# Patient Record
Sex: Female | Born: 1935 | Race: White | Hispanic: No | Smoking: Never smoker
Health system: Southern US, Community
[De-identification: ages and names within clinical notes are randomized; demographics above are authoritative.]

## PROBLEM LIST (undated history)

## (undated) DIAGNOSIS — I10 Essential (primary) hypertension: Secondary | ICD-10-CM

## (undated) DIAGNOSIS — E559 Vitamin D deficiency, unspecified: Secondary | ICD-10-CM

## (undated) DIAGNOSIS — E785 Hyperlipidemia, unspecified: Secondary | ICD-10-CM

## (undated) DIAGNOSIS — K589 Irritable bowel syndrome without diarrhea: Secondary | ICD-10-CM

## (undated) DIAGNOSIS — K5790 Diverticulosis of intestine, part unspecified, without perforation or abscess without bleeding: Secondary | ICD-10-CM

## (undated) DIAGNOSIS — J019 Acute sinusitis, unspecified: Secondary | ICD-10-CM

## (undated) DIAGNOSIS — K219 Gastro-esophageal reflux disease without esophagitis: Secondary | ICD-10-CM

## (undated) DIAGNOSIS — C541 Malignant neoplasm of endometrium: Secondary | ICD-10-CM

## (undated) DIAGNOSIS — M8580 Other specified disorders of bone density and structure, unspecified site: Secondary | ICD-10-CM

## (undated) HISTORY — DX: Acute sinusitis, unspecified: J01.90

## (undated) HISTORY — DX: Irritable bowel syndrome, unspecified: K58.9

## (undated) HISTORY — DX: Essential (primary) hypertension: I10

## (undated) HISTORY — DX: Malignant neoplasm of endometrium: C54.1

## (undated) HISTORY — DX: Diverticulosis of intestine, part unspecified, without perforation or abscess without bleeding: K57.90

## (undated) HISTORY — DX: Hyperlipidemia, unspecified: E78.5

## (undated) HISTORY — DX: Vitamin D deficiency, unspecified: E55.9

## (undated) HISTORY — DX: Gilbert syndrome: E80.4

## (undated) HISTORY — DX: Other specified disorders of bone density and structure, unspecified site: M85.80

## (undated) HISTORY — DX: Gastro-esophageal reflux disease without esophagitis: K21.9

---

## 1997-10-25 ENCOUNTER — Inpatient Hospital Stay (HOSPITAL_COMMUNITY): Admission: RE | Admit: 1997-10-25 | Discharge: 1997-10-30 | Payer: Self-pay | Admitting: Orthopedic Surgery

## 1999-02-20 ENCOUNTER — Encounter: Payer: Self-pay | Admitting: Orthopedic Surgery

## 1999-02-24 ENCOUNTER — Inpatient Hospital Stay (HOSPITAL_COMMUNITY): Admission: RE | Admit: 1999-02-24 | Discharge: 1999-02-28 | Payer: Self-pay | Admitting: Orthopedic Surgery

## 2002-10-25 ENCOUNTER — Encounter: Payer: Self-pay | Admitting: Internal Medicine

## 2002-10-25 ENCOUNTER — Encounter: Admission: RE | Admit: 2002-10-25 | Discharge: 2002-10-25 | Payer: Self-pay | Admitting: Internal Medicine

## 2004-04-01 ENCOUNTER — Encounter: Admission: RE | Admit: 2004-04-01 | Discharge: 2004-04-01 | Payer: Self-pay | Admitting: Internal Medicine

## 2004-04-15 ENCOUNTER — Ambulatory Visit (HOSPITAL_COMMUNITY): Admission: RE | Admit: 2004-04-15 | Discharge: 2004-04-15 | Payer: Self-pay | Admitting: Gastroenterology

## 2004-04-25 ENCOUNTER — Ambulatory Visit (HOSPITAL_COMMUNITY): Admission: RE | Admit: 2004-04-25 | Discharge: 2004-04-25 | Payer: Self-pay | Admitting: Internal Medicine

## 2004-09-03 ENCOUNTER — Ambulatory Visit (HOSPITAL_COMMUNITY): Admission: RE | Admit: 2004-09-03 | Discharge: 2004-09-03 | Payer: Self-pay | Admitting: Gastroenterology

## 2004-10-02 ENCOUNTER — Ambulatory Visit (HOSPITAL_COMMUNITY): Admission: RE | Admit: 2004-10-02 | Discharge: 2004-10-02 | Payer: Self-pay | Admitting: Gastroenterology

## 2005-10-01 ENCOUNTER — Ambulatory Visit (HOSPITAL_COMMUNITY): Admission: RE | Admit: 2005-10-01 | Discharge: 2005-10-01 | Payer: Self-pay | Admitting: Obstetrics and Gynecology

## 2006-10-05 ENCOUNTER — Inpatient Hospital Stay (HOSPITAL_COMMUNITY): Admission: RE | Admit: 2006-10-05 | Discharge: 2006-10-06 | Payer: Self-pay | Admitting: Neurosurgery

## 2007-12-29 ENCOUNTER — Ambulatory Visit (HOSPITAL_COMMUNITY): Admission: RE | Admit: 2007-12-29 | Discharge: 2007-12-29 | Payer: Self-pay | Admitting: Internal Medicine

## 2009-12-30 ENCOUNTER — Ambulatory Visit (HOSPITAL_COMMUNITY): Admission: RE | Admit: 2009-12-30 | Discharge: 2009-12-30 | Payer: Self-pay | Admitting: Family Medicine

## 2010-09-16 NOTE — Op Note (Signed)
Brianna Bush            ACCOUNT NO.:  [id number]   MEDICAL RECORD NO.:  [id number]          PATIENT TYPE:  INP   LOCATION:  2899                         FACILITY:  [facility]   PHYSICIAN:  Danae Orleans. Venetia Maxon, M.D.  DATE OF BIRTH:  April 14, 1924   DATE OF PROCEDURE:  10/05/2006  DATE OF DISCHARGE:                               OPERATIVE REPORT   PREOPERATIVE DIAGNOSIS:  Herniated lumbar disk L2-3 left with  retrolisthesis L2 on L3 along with spondylosis, degenerative disease and  radiculopathy.   FINAL DIAGNOSIS:  Herniated lumbar disk L2-3 left with retrolisthesis L2  on L3 along with spondylosis, degenerative disease and radiculopathy.   PROCEDURES:  Left L2-3 microdiskectomy with microdissection.   SURGEON:  Danae Orleans. Venetia Maxon, M.D.   ASSISTANT:  Clydene Fake, M.D.   ANESTHESIA:  General endotracheal anesthesia.   BLOOD LOSS:  50 mL.   COMPLICATIONS:  None.   DISPOSITION:  Recovery.   INDICATIONS:  Brianna Bush is an 77 year old woman with severe  intractable left leg pain.  She had an MRI of her lumbar spine which was  interpreted as demonstrating epidural hematoma on the left at L2-3 but  the patient failed to improve and came for neurosurgical consultation.  I felt that this was consistent with a disk herniation and free fragment  herniated disk material rather than epidural hematoma.  She has  retrolisthesis of L2 on L3 but does not complain of significant back  pain and has mostly left leg pain.  It was elected to take her to  surgery for microdiskectomy at this affected level.   PROCEDURE:  Ms. Minetti was brought to the operating room.  Following  satisfactory and uncomplicated induction of general endotracheal  anesthesia and placement intravenous lines, the patient was placed in  the prone position on the Wilson frame.  Her low back was then prepped  and draped in the usual sterile fashion.  A spinal needle was placed and  the preop localizing x-ray was  obtained but was not possible to  visualize the spinal needle because of the patient's large body habitus.  Subsequently additional x-ray was obtained which demonstrated the needle  at overlying L2 and the area of planned incision was then infiltrated  with quarter percent Marcaine and half percent lidocaine with 1:200,000  epinephrine.  Incision was made in midline, carried to the lumbodorsal  fascia which was incised to the left side of midline.  Subperiosteal  dissection was performed exposing what was felt to be the L2-3 level.  Her intraoperative x-ray demonstrated marker probe at L1-2.  The  exposure was carried caudad one level and final x-ray demonstrated  marker probe at L2-3.  Hemisemilaminectomy of L2 was then performed with  high-speed drill and completed with Kerrison rongeur.  An effort was  made not to encroach upon the facette joint because of significant  instability at this level.  The cephalad aspect of the L3 lamina was  identified and using Kerrison rongeur and laminectomy at this level  overlying the superior aspect of L3 on the left was then performed  exposing the thecal sac.  Lateral recess was also decompressed and the  ligamentum flavum was detached and removed in piecemeal fashion.  Microscope was brought into the field and under microscopic  visualization, the lateral aspect of the thecal sac was identified and  retracted medially with a Penfield four dissector the L3 nerve root was  identified and within the axilla of the nerve root large amount of  herniated disk material was removed with micro pituitary which was  causing significant compression of the L3 nerve root along with the  lateral aspect of the canal.  A ball tip probe was then inserted in the  spinal canal and additional fragments of disk material were removed with  resultant significant decompression of thecal sac and the L3 nerve root.  The disk space itself was not entered.  There did not appear  to be  significant residual herniated disk material.  Hemostasis was assured  with Gelfoam soaked in thrombin and subsequently the operative bed was  bathed in 2 mL of fentanyl and Depo-Medrol.  The self-retaining tractor  was removed.  Microscope was taken out of the field.  Te lumbodorsal  fascia was closed with 0-0 Vicryl sutures, subcutaneous tissues were  approximated 2-0 Vicryl interrupted, inverted sutures and skin edges  were approximated interrupted 3-0 Vicryl, subcu stitch.  The wound was  dressed with Dermabond.  The patient extubated in the operating room  taken to recovery in stable satisfactory condition having her operation  well.  Counts correct at end the case.      Danae Orleans. Venetia Maxon, M.D.  Electronically Signed     JDS/MEDQ  D:  10/05/2006  T:  10/05/2006  Job:  [id number]

## 2010-09-19 NOTE — Op Note (Signed)
NAMEIRVING, LUBBERS            ACCOUNT NO.:  [id number]   MEDICAL RECORD NO.:  [id number]          PATIENT TYPE:  AMB   LOCATION:  ENDO                         FACILITY:  [facility]   PHYSICIAN:  Danise Edge, M.D.   DATE OF BIRTH:  [birth year]   DATE OF PROCEDURE:  09/03/2004  DATE OF DISCHARGE:                                 OPERATIVE REPORT   PROCEDURE INDICATIONS:  Ms. Jehan Ranganathan is a 80-hour-old female born  1923-10-15. Ms. Mccullars has laryngitis suspected to be due to  uncontrolled gastroesophageal reflux. Her symptoms developed on a proton  pump inhibitor and did not improve with double dose proton pump inhibitor  therapy. She reports no heartburn, dysphagia or dilatation.   ENDOSCOPIST:  Danise Edge, M.D.   PREMEDICATION:  Versed 5 mg, Demerol 50 mg.   PROCEDURE:  After obtaining informed consent, Ms. Martha was placed in the  left lateral decubitus position. I administered intravenous Demerol and  intravenous Versed to achieve conscious sedation for the procedure. The  patient's blood pressure, oxygen saturation and cardiac rhythm were  monitored throughout the procedure and documented in the medical record.   The Olympus gastroscope was passed through the posterior hypopharynx into  the proximal esophagus without difficulty. The hypopharynx, larynx and vocal  cords appeared normal by my exam. She underwent an ear, nose and throat exam  in the region recent past which suggested laryngitis due to gastroesophageal  reflux.   Esophagoscopy:  The proximal mid and lower segments of the esophageal mucosa  appear completely normal. The squamocolumnar junction is noted at 35 cm from  the incisor teeth. There is no endoscopic evidence for the presence of  erosive esophagitis, Barrett's esophagus or esophageal mucosal scarring.   Gastroscopy:  Ms. Deloria has a moderate-sized hiatal hernia. Retroflexed  view of the gastric cardia and fundus was normal. The  diaphragmatic hiatus  is quite patulous. The gastric body, antrum and pylorus appear normal.   Duodenoscopy:  The duodenal bulb, mid duodenum and distal duodenum appear  normal.   ASSESSMENT:  Gastroesophageal reflux associated with a hiatal hernia but  otherwise normal esophagogastroduodenoscopy.   RECOMMENDATIONS:  I will schedule Ms. Gearhart for an esophageal manometry  with 24-hour ambulatory esophageal pH study on Axid each morning to  determine if she has uncontrolled acid reflux on proton pump inhibitor  therapy and should consider antireflux surgery.      MJ/MEDQ  D:  09/03/2004  T:  09/03/2004  Job:  [id number]   cc:   Darius Bump, M.D.  [place]  [place] [address]  Fax: [phone]

## 2010-09-19 NOTE — Op Note (Signed)
[name], [name]            ACCOUNT NO.:  [id number]   MEDICAL RECORD NO.:  [id number]          PATIENT TYPE:  AMB   LOCATION:  ENDO                         FACILITY:  [facility]   PHYSICIAN:  Danise Edge, M.D.   DATE OF BIRTH:  October 24, 1923   DATE OF PROCEDURE:  10/02/2004  DATE OF DISCHARGE:  10/02/2004                                 OPERATIVE REPORT   PROCEDURE:  Esophageal manometry with ambulatory esophageal pH study.   INDICATIONS:  Ms. Brianna Bush is a 77 year old female born February 03, 1924.  [name] takes Aciphex to control her chronic gastroesophageal  reflux.  She is undergoing ambulatory esophageal pH monitoring while on  Aciphex to determine if she is having uncontrolled acid reflux causing  hoarseness.   PROCEDURE:  Esophageal manometry report.   Upper esophageal sphincter data:  Ms. Brianna Bush upper esophageal sphincter  function is coordinated and normal.   Esophageal body data:  Ms. Brianna Bush esophageal body function is based on 10  wet swallows.  Ninety percent of wet swallows resulted in peristaltic contractions with a  wave amplitude 167 mmHg.  One of 10 wet swallows resulted in a simultaneous  contraction.  Esophageal body function is normal.   Lower esophageal sphincter data:  Ms. Brianna Bush resting lower esophageal  sphincter pressure is 45 mmHg.  The lower esophageal sphincter relaxes 85%  with swallows and remains relaxed for 14 seconds.  Lower esophageal  sphincter function is normal with a resting pressure in the upper limits of  normal.   ASSESSMENT:  Normal esophageal manometry.   AMBULATORY ESOPHAGEAL PH DATA:  The composite score analysis based on the  distal channel recording and proximal channel recording was within normal  limits, indicating no excessive esophageal reflux on Aciphex.  Ms. Brianna Bush  upright time in reflux was 0.2%; recumbent time in reflux 0.1%; total time  in reflux 0.1%. Ms. Brianna Bush had no reflux episodes  lasting over five  minutes. During her 24-hour monitoring, she had five episodes of reflux with  the longest episode lasting one minute.  Her composite score analysis:  1.1  (normal less than 22).   ASSESSMENT:  Normal ambulatory esophageal pH study on Aciphex.       MJ/MEDQ  D:  10/21/2004  T:  10/21/2004  Job:  [id number]

## 2010-09-19 NOTE — Op Note (Signed)
[name], Brianna Bush            ACCOUNT NO.:  [id number]   MEDICAL RECORD NO.:  [id number]          PATIENT TYPE:  AMB   LOCATION:  ENDO                         [facility]ACILITY:  [facility]   PHYSICIAN:  Danise Edge, M.D.   DATE OF BIRTH:  02-20-1924   DATE OF PROCEDURE:  04/15/2004  DATE OF DISCHARGE:                                 OPERATIVE REPORT   PROCEDURE:  Screening colonoscopy.   REFERRING PHYSICIAN:  Darius Bump, M.D.   PROCEDURE INDICATION:  Ms. Onetha Gaffey is a 77 year old female born  21-Mar-1924.  Ms. Constantin is scheduled to undergo her first screening  colonoscopy with polypectomy to prevent colon cancer.   ENDOSCOPIST:  Danise Edge, M.D.   PREMEDICATION:  Versed 5 mg, Demerol 60 mg.   PROCEDURE:  After obtaining informed consent, Ms. Foland was placed in the  left lateral decubitus position.  I administered intravenous Demerol and  intravenous Versed to achieve conscious sedation for the procedure.  The  patient's blood pressure, oxygen saturation, and cardiac rhythm were  monitored throughout the procedure and documented in the medical record.   Anal inspection and digital rectal exam were normal.  The Olympus adjustable  pediatric colonoscope was introduced into the rectum and advanced to the  cecum.  Colonic preparation for the exam today was excellent.   Rectum normal.   Sigmoid colon and descending colon:  Colonic diverticulosis.   Splenic flexure normal.   Transverse colon normal.   Hepatic flexure normal.   Ascending colon normal.   Cecum and ileocecal valve normal.   ASSESSMENT:  Normal screening proctocolonoscopy to the cecum.  Left colonic  diverticulosis.      MJ/MEDQ  D:  04/15/2004  T:  04/15/2004  Job:  [id number]   cc:   Darius Bump, M.D.  [place]  [place] [address]  Fax: [phone]

## 2011-02-19 LAB — HEPATIC FUNCTION PANEL
ALT: 20
Alkaline Phosphatase: 44
Indirect Bilirubin: 2 — ABNORMAL HIGH
Total Protein: 7.1

## 2011-12-07 ENCOUNTER — Other Ambulatory Visit: Payer: Self-pay | Admitting: Family Medicine

## 2011-12-07 DIAGNOSIS — I10 Essential (primary) hypertension: Secondary | ICD-10-CM

## 2011-12-07 DIAGNOSIS — R42 Dizziness and giddiness: Secondary | ICD-10-CM

## 2012-01-12 ENCOUNTER — Ambulatory Visit
Admission: RE | Admit: 2012-01-12 | Discharge: 2012-01-12 | Disposition: A | Discharge status: Home / Self Care | Payer: Medicare Other | Source: Ambulatory Visit | Attending: Family Medicine | Admitting: Family Medicine

## 2012-01-12 DIAGNOSIS — I10 Essential (primary) hypertension: Secondary | ICD-10-CM

## 2012-01-12 DIAGNOSIS — R42 Dizziness and giddiness: Secondary | ICD-10-CM

## 2012-12-30 ENCOUNTER — Other Ambulatory Visit: Payer: Self-pay | Admitting: Family Medicine

## 2012-12-30 DIAGNOSIS — I6529 Occlusion and stenosis of unspecified carotid artery: Secondary | ICD-10-CM

## 2013-01-04 ENCOUNTER — Ambulatory Visit
Admission: RE | Admit: 2013-01-04 | Discharge: 2013-01-04 | Disposition: A | Discharge status: Home / Self Care | Payer: Medicare Other | Source: Ambulatory Visit | Attending: Family Medicine | Admitting: Family Medicine

## 2013-01-04 DIAGNOSIS — I6529 Occlusion and stenosis of unspecified carotid artery: Secondary | ICD-10-CM

## 2013-01-09 ENCOUNTER — Other Ambulatory Visit: Payer: Self-pay | Admitting: Family Medicine

## 2013-01-09 DIAGNOSIS — E041 Nontoxic single thyroid nodule: Secondary | ICD-10-CM

## 2013-01-11 ENCOUNTER — Ambulatory Visit
Admission: RE | Admit: 2013-01-11 | Discharge: 2013-01-11 | Disposition: A | Discharge status: Home / Self Care | Payer: Medicare Other | Source: Ambulatory Visit | Attending: Family Medicine | Admitting: Family Medicine

## 2013-01-11 DIAGNOSIS — E041 Nontoxic single thyroid nodule: Secondary | ICD-10-CM

## 2013-01-17 ENCOUNTER — Other Ambulatory Visit: Payer: Self-pay | Admitting: Family Medicine

## 2013-01-17 DIAGNOSIS — E041 Nontoxic single thyroid nodule: Secondary | ICD-10-CM

## 2013-01-18 ENCOUNTER — Other Ambulatory Visit: Payer: Self-pay | Admitting: Family Medicine

## 2013-01-18 DIAGNOSIS — E041 Nontoxic single thyroid nodule: Secondary | ICD-10-CM

## 2013-01-19 ENCOUNTER — Other Ambulatory Visit (HOSPITAL_COMMUNITY)
Admission: RE | Admit: 2013-01-19 | Discharge: 2013-01-19 | Disposition: A | Discharge status: Home / Self Care | Payer: Medicare Other | Source: Ambulatory Visit | Attending: Interventional Radiology | Admitting: Interventional Radiology

## 2013-01-19 ENCOUNTER — Ambulatory Visit
Admission: RE | Admit: 2013-01-19 | Discharge: 2013-01-19 | Disposition: A | Discharge status: Home / Self Care | Payer: Medicare Other | Source: Ambulatory Visit | Attending: Family Medicine | Admitting: Family Medicine

## 2013-01-19 DIAGNOSIS — E041 Nontoxic single thyroid nodule: Secondary | ICD-10-CM

## 2014-03-09 ENCOUNTER — Other Ambulatory Visit: Payer: Self-pay | Admitting: Family Medicine

## 2014-03-09 DIAGNOSIS — I6521 Occlusion and stenosis of right carotid artery: Secondary | ICD-10-CM

## 2014-03-13 ENCOUNTER — Ambulatory Visit
Admission: RE | Admit: 2014-03-13 | Discharge: 2014-03-13 | Disposition: A | Discharge status: Home / Self Care | Payer: Medicare HMO | Source: Ambulatory Visit | Attending: Family Medicine | Admitting: Family Medicine

## 2014-03-13 DIAGNOSIS — I6521 Occlusion and stenosis of right carotid artery: Secondary | ICD-10-CM

## 2015-02-12 DIAGNOSIS — J385 Laryngeal spasm: Secondary | ICD-10-CM | POA: Diagnosis not present

## 2015-02-20 DIAGNOSIS — H353113 Nonexudative age-related macular degeneration, right eye, advanced atrophic without subfoveal involvement: Secondary | ICD-10-CM | POA: Diagnosis not present

## 2015-02-20 DIAGNOSIS — H353221 Exudative age-related macular degeneration, left eye, with active choroidal neovascularization: Secondary | ICD-10-CM | POA: Diagnosis not present

## 2015-03-05 DIAGNOSIS — M7051 Other bursitis of knee, right knee: Secondary | ICD-10-CM | POA: Diagnosis not present

## 2015-03-08 DIAGNOSIS — J385 Laryngeal spasm: Secondary | ICD-10-CM | POA: Diagnosis not present

## 2015-03-25 DIAGNOSIS — H353221 Exudative age-related macular degeneration, left eye, with active choroidal neovascularization: Secondary | ICD-10-CM | POA: Diagnosis not present

## 2015-03-25 DIAGNOSIS — H43813 Vitreous degeneration, bilateral: Secondary | ICD-10-CM | POA: Diagnosis not present

## 2015-03-25 DIAGNOSIS — H353113 Nonexudative age-related macular degeneration, right eye, advanced atrophic without subfoveal involvement: Secondary | ICD-10-CM | POA: Diagnosis not present

## 2015-03-27 ENCOUNTER — Other Ambulatory Visit: Payer: Self-pay | Admitting: Family Medicine

## 2015-03-27 DIAGNOSIS — I6529 Occlusion and stenosis of unspecified carotid artery: Secondary | ICD-10-CM

## 2015-03-27 DIAGNOSIS — I6523 Occlusion and stenosis of bilateral carotid arteries: Secondary | ICD-10-CM | POA: Diagnosis not present

## 2015-04-03 ENCOUNTER — Ambulatory Visit
Admission: RE | Admit: 2015-04-03 | Discharge: 2015-04-03 | Disposition: A | Discharge status: Home / Self Care | Payer: Medicare HMO | Source: Ambulatory Visit | Attending: Family Medicine | Admitting: Family Medicine

## 2015-04-03 DIAGNOSIS — I6529 Occlusion and stenosis of unspecified carotid artery: Secondary | ICD-10-CM

## 2015-04-03 DIAGNOSIS — I6523 Occlusion and stenosis of bilateral carotid arteries: Secondary | ICD-10-CM | POA: Diagnosis not present

## 2015-05-01 DIAGNOSIS — H43813 Vitreous degeneration, bilateral: Secondary | ICD-10-CM | POA: Diagnosis not present

## 2015-05-01 DIAGNOSIS — H34212 Partial retinal artery occlusion, left eye: Secondary | ICD-10-CM | POA: Diagnosis not present

## 2015-05-01 DIAGNOSIS — H353113 Nonexudative age-related macular degeneration, right eye, advanced atrophic without subfoveal involvement: Secondary | ICD-10-CM | POA: Diagnosis not present

## 2015-05-01 DIAGNOSIS — H353221 Exudative age-related macular degeneration, left eye, with active choroidal neovascularization: Secondary | ICD-10-CM | POA: Diagnosis not present

## 2015-06-05 DIAGNOSIS — H35423 Microcystoid degeneration of retina, bilateral: Secondary | ICD-10-CM | POA: Diagnosis not present

## 2015-06-05 DIAGNOSIS — H353221 Exudative age-related macular degeneration, left eye, with active choroidal neovascularization: Secondary | ICD-10-CM | POA: Diagnosis not present

## 2015-06-05 DIAGNOSIS — H353113 Nonexudative age-related macular degeneration, right eye, advanced atrophic without subfoveal involvement: Secondary | ICD-10-CM | POA: Diagnosis not present

## 2015-06-05 DIAGNOSIS — H43813 Vitreous degeneration, bilateral: Secondary | ICD-10-CM | POA: Diagnosis not present

## 2015-06-18 DIAGNOSIS — M109 Gout, unspecified: Secondary | ICD-10-CM | POA: Diagnosis not present

## 2015-06-18 DIAGNOSIS — Z791 Long term (current) use of non-steroidal anti-inflammatories (NSAID): Secondary | ICD-10-CM | POA: Diagnosis not present

## 2015-06-18 DIAGNOSIS — I6529 Occlusion and stenosis of unspecified carotid artery: Secondary | ICD-10-CM | POA: Diagnosis not present

## 2015-06-18 DIAGNOSIS — E785 Hyperlipidemia, unspecified: Secondary | ICD-10-CM | POA: Diagnosis not present

## 2015-06-18 DIAGNOSIS — Z79899 Other long term (current) drug therapy: Secondary | ICD-10-CM | POA: Diagnosis not present

## 2015-06-18 DIAGNOSIS — M159 Polyosteoarthritis, unspecified: Secondary | ICD-10-CM | POA: Diagnosis not present

## 2015-06-18 DIAGNOSIS — E559 Vitamin D deficiency, unspecified: Secondary | ICD-10-CM | POA: Diagnosis not present

## 2015-06-27 DIAGNOSIS — M109 Gout, unspecified: Secondary | ICD-10-CM | POA: Diagnosis not present

## 2015-06-27 DIAGNOSIS — E559 Vitamin D deficiency, unspecified: Secondary | ICD-10-CM | POA: Diagnosis not present

## 2015-06-27 DIAGNOSIS — I6523 Occlusion and stenosis of bilateral carotid arteries: Secondary | ICD-10-CM | POA: Diagnosis not present

## 2015-06-27 DIAGNOSIS — R350 Frequency of micturition: Secondary | ICD-10-CM | POA: Diagnosis not present

## 2015-06-27 DIAGNOSIS — I10 Essential (primary) hypertension: Secondary | ICD-10-CM | POA: Diagnosis not present

## 2015-06-27 DIAGNOSIS — M810 Age-related osteoporosis without current pathological fracture: Secondary | ICD-10-CM | POA: Diagnosis not present

## 2015-06-27 DIAGNOSIS — L989 Disorder of the skin and subcutaneous tissue, unspecified: Secondary | ICD-10-CM | POA: Diagnosis not present

## 2015-06-27 DIAGNOSIS — E785 Hyperlipidemia, unspecified: Secondary | ICD-10-CM | POA: Diagnosis not present

## 2015-06-27 DIAGNOSIS — K219 Gastro-esophageal reflux disease without esophagitis: Secondary | ICD-10-CM | POA: Diagnosis not present

## 2015-06-27 DIAGNOSIS — R809 Proteinuria, unspecified: Secondary | ICD-10-CM | POA: Diagnosis not present

## 2015-07-10 DIAGNOSIS — H43813 Vitreous degeneration, bilateral: Secondary | ICD-10-CM | POA: Diagnosis not present

## 2015-07-10 DIAGNOSIS — H353221 Exudative age-related macular degeneration, left eye, with active choroidal neovascularization: Secondary | ICD-10-CM | POA: Diagnosis not present

## 2015-07-10 DIAGNOSIS — H353113 Nonexudative age-related macular degeneration, right eye, advanced atrophic without subfoveal involvement: Secondary | ICD-10-CM | POA: Diagnosis not present

## 2015-07-15 DIAGNOSIS — L821 Other seborrheic keratosis: Secondary | ICD-10-CM | POA: Diagnosis not present

## 2015-07-15 DIAGNOSIS — Z23 Encounter for immunization: Secondary | ICD-10-CM | POA: Diagnosis not present

## 2015-07-15 DIAGNOSIS — L570 Actinic keratosis: Secondary | ICD-10-CM | POA: Diagnosis not present

## 2015-07-15 DIAGNOSIS — C44321 Squamous cell carcinoma of skin of nose: Secondary | ICD-10-CM | POA: Diagnosis not present

## 2015-07-15 DIAGNOSIS — D485 Neoplasm of uncertain behavior of skin: Secondary | ICD-10-CM | POA: Diagnosis not present

## 2015-07-24 DIAGNOSIS — H52203 Unspecified astigmatism, bilateral: Secondary | ICD-10-CM | POA: Diagnosis not present

## 2015-07-24 DIAGNOSIS — H524 Presbyopia: Secondary | ICD-10-CM | POA: Diagnosis not present

## 2015-07-24 DIAGNOSIS — H5203 Hypermetropia, bilateral: Secondary | ICD-10-CM | POA: Diagnosis not present

## 2015-08-13 DIAGNOSIS — H43813 Vitreous degeneration, bilateral: Secondary | ICD-10-CM | POA: Diagnosis not present

## 2015-08-13 DIAGNOSIS — H353113 Nonexudative age-related macular degeneration, right eye, advanced atrophic without subfoveal involvement: Secondary | ICD-10-CM | POA: Diagnosis not present

## 2015-08-13 DIAGNOSIS — H353221 Exudative age-related macular degeneration, left eye, with active choroidal neovascularization: Secondary | ICD-10-CM | POA: Diagnosis not present

## 2015-09-11 DIAGNOSIS — H43813 Vitreous degeneration, bilateral: Secondary | ICD-10-CM | POA: Diagnosis not present

## 2015-09-11 DIAGNOSIS — H35423 Microcystoid degeneration of retina, bilateral: Secondary | ICD-10-CM | POA: Diagnosis not present

## 2015-09-11 DIAGNOSIS — H353113 Nonexudative age-related macular degeneration, right eye, advanced atrophic without subfoveal involvement: Secondary | ICD-10-CM | POA: Diagnosis not present

## 2015-09-11 DIAGNOSIS — H353221 Exudative age-related macular degeneration, left eye, with active choroidal neovascularization: Secondary | ICD-10-CM | POA: Diagnosis not present

## 2015-09-14 DIAGNOSIS — L03116 Cellulitis of left lower limb: Secondary | ICD-10-CM | POA: Diagnosis not present

## 2015-09-14 DIAGNOSIS — S81802A Unspecified open wound, left lower leg, initial encounter: Secondary | ICD-10-CM | POA: Diagnosis not present

## 2015-09-19 DIAGNOSIS — H5032 Intermittent alternating esotropia: Secondary | ICD-10-CM | POA: Diagnosis not present

## 2015-09-20 DIAGNOSIS — J385 Laryngeal spasm: Secondary | ICD-10-CM | POA: Diagnosis not present

## 2015-09-23 DIAGNOSIS — S81802D Unspecified open wound, left lower leg, subsequent encounter: Secondary | ICD-10-CM | POA: Diagnosis not present

## 2015-09-23 DIAGNOSIS — L97929 Non-pressure chronic ulcer of unspecified part of left lower leg with unspecified severity: Secondary | ICD-10-CM | POA: Diagnosis not present

## 2015-09-23 DIAGNOSIS — L03116 Cellulitis of left lower limb: Secondary | ICD-10-CM | POA: Diagnosis not present

## 2015-09-25 DIAGNOSIS — T798XXA Other early complications of trauma, initial encounter: Secondary | ICD-10-CM | POA: Diagnosis not present

## 2015-09-25 DIAGNOSIS — S81802A Unspecified open wound, left lower leg, initial encounter: Secondary | ICD-10-CM | POA: Diagnosis not present

## 2015-10-01 DIAGNOSIS — L989 Disorder of the skin and subcutaneous tissue, unspecified: Secondary | ICD-10-CM | POA: Diagnosis not present

## 2015-10-01 DIAGNOSIS — T798XXA Other early complications of trauma, initial encounter: Secondary | ICD-10-CM | POA: Diagnosis not present

## 2015-10-01 DIAGNOSIS — T148 Other injury of unspecified body region: Secondary | ICD-10-CM | POA: Diagnosis not present

## 2015-10-03 DIAGNOSIS — M810 Age-related osteoporosis without current pathological fracture: Secondary | ICD-10-CM | POA: Diagnosis not present

## 2015-10-03 DIAGNOSIS — E782 Mixed hyperlipidemia: Secondary | ICD-10-CM | POA: Diagnosis not present

## 2015-10-03 DIAGNOSIS — Z0000 Encounter for general adult medical examination without abnormal findings: Secondary | ICD-10-CM | POA: Diagnosis not present

## 2015-10-03 DIAGNOSIS — I10 Essential (primary) hypertension: Secondary | ICD-10-CM | POA: Diagnosis not present

## 2015-10-04 DIAGNOSIS — L989 Disorder of the skin and subcutaneous tissue, unspecified: Secondary | ICD-10-CM | POA: Diagnosis not present

## 2015-10-10 ENCOUNTER — Encounter (HOSPITAL_COMMUNITY): Payer: Self-pay | Admitting: Emergency Medicine

## 2015-10-10 ENCOUNTER — Inpatient Hospital Stay (HOSPITAL_COMMUNITY)
Admission: EM | Admit: 2015-10-10 | Discharge: 2015-10-12 | DRG: 392 | Disposition: A | Discharge status: Home / Self Care | Payer: Medicare HMO | Attending: Internal Medicine | Admitting: Internal Medicine

## 2015-10-10 ENCOUNTER — Other Ambulatory Visit: Payer: Self-pay | Admitting: Family

## 2015-10-10 ENCOUNTER — Emergency Department (HOSPITAL_COMMUNITY): Payer: Medicare HMO

## 2015-10-10 DIAGNOSIS — K5732 Diverticulitis of large intestine without perforation or abscess without bleeding: Secondary | ICD-10-CM

## 2015-10-10 DIAGNOSIS — A09 Infectious gastroenteritis and colitis, unspecified: Secondary | ICD-10-CM | POA: Diagnosis not present

## 2015-10-10 DIAGNOSIS — Z8542 Personal history of malignant neoplasm of other parts of uterus: Secondary | ICD-10-CM

## 2015-10-10 DIAGNOSIS — E559 Vitamin D deficiency, unspecified: Secondary | ICD-10-CM | POA: Diagnosis present

## 2015-10-10 DIAGNOSIS — K529 Noninfective gastroenteritis and colitis, unspecified: Secondary | ICD-10-CM | POA: Diagnosis not present

## 2015-10-10 DIAGNOSIS — C541 Malignant neoplasm of endometrium: Secondary | ICD-10-CM | POA: Diagnosis present

## 2015-10-10 DIAGNOSIS — Z7952 Long term (current) use of systemic steroids: Secondary | ICD-10-CM

## 2015-10-10 DIAGNOSIS — R1032 Left lower quadrant pain: Secondary | ICD-10-CM

## 2015-10-10 DIAGNOSIS — Z79899 Other long term (current) drug therapy: Secondary | ICD-10-CM

## 2015-10-10 DIAGNOSIS — N179 Acute kidney failure, unspecified: Secondary | ICD-10-CM | POA: Diagnosis present

## 2015-10-10 DIAGNOSIS — L97829 Non-pressure chronic ulcer of other part of left lower leg with unspecified severity: Secondary | ICD-10-CM | POA: Diagnosis present

## 2015-10-10 DIAGNOSIS — E876 Hypokalemia: Secondary | ICD-10-CM | POA: Diagnosis present

## 2015-10-10 DIAGNOSIS — N289 Disorder of kidney and ureter, unspecified: Secondary | ICD-10-CM

## 2015-10-10 DIAGNOSIS — R197 Diarrhea, unspecified: Secondary | ICD-10-CM | POA: Diagnosis not present

## 2015-10-10 DIAGNOSIS — R8290 Unspecified abnormal findings in urine: Secondary | ICD-10-CM | POA: Diagnosis not present

## 2015-10-10 DIAGNOSIS — K589 Irritable bowel syndrome without diarrhea: Secondary | ICD-10-CM | POA: Diagnosis present

## 2015-10-10 DIAGNOSIS — M8580 Other specified disorders of bone density and structure, unspecified site: Secondary | ICD-10-CM | POA: Diagnosis present

## 2015-10-10 DIAGNOSIS — S81802A Unspecified open wound, left lower leg, initial encounter: Secondary | ICD-10-CM | POA: Diagnosis present

## 2015-10-10 DIAGNOSIS — I499 Cardiac arrhythmia, unspecified: Secondary | ICD-10-CM | POA: Diagnosis not present

## 2015-10-10 DIAGNOSIS — Z888 Allergy status to other drugs, medicaments and biological substances status: Secondary | ICD-10-CM

## 2015-10-10 DIAGNOSIS — E785 Hyperlipidemia, unspecified: Secondary | ICD-10-CM | POA: Diagnosis present

## 2015-10-10 DIAGNOSIS — I10 Essential (primary) hypertension: Secondary | ICD-10-CM | POA: Diagnosis not present

## 2015-10-10 DIAGNOSIS — K219 Gastro-esophageal reflux disease without esophagitis: Secondary | ICD-10-CM | POA: Diagnosis present

## 2015-10-10 DIAGNOSIS — R7989 Other specified abnormal findings of blood chemistry: Secondary | ICD-10-CM | POA: Diagnosis present

## 2015-10-10 DIAGNOSIS — N1831 Chronic kidney disease, stage 3a: Secondary | ICD-10-CM

## 2015-10-10 LAB — URINALYSIS, ROUTINE W REFLEX MICROSCOPIC
Bilirubin Urine: NEGATIVE
Glucose, UA: NEGATIVE mg/dL
Hgb urine dipstick: NEGATIVE
Ketones, ur: NEGATIVE mg/dL
NITRITE: NEGATIVE
PH: 5 (ref 5.0–8.0)
Protein, ur: 100 mg/dL — AB
SPECIFIC GRAVITY, URINE: 1.024 (ref 1.005–1.030)

## 2015-10-10 LAB — CBC WITH DIFFERENTIAL/PLATELET
Basophils Absolute: 0 10*3/uL (ref 0.0–0.1)
Basophils Relative: 0 %
EOS PCT: 0 %
Eosinophils Absolute: 0 10*3/uL (ref 0.0–0.7)
HCT: 39.8 % (ref 36.0–46.0)
HEMOGLOBIN: 13.9 g/dL (ref 12.0–15.0)
LYMPHS PCT: 11 %
Lymphs Abs: 2.2 10*3/uL (ref 0.7–4.0)
MCH: 30.6 pg (ref 26.0–34.0)
MCHC: 34.9 g/dL (ref 30.0–36.0)
MCV: 87.7 fL (ref 78.0–100.0)
MONOS PCT: 8 %
Monocytes Absolute: 1.6 10*3/uL — ABNORMAL HIGH (ref 0.1–1.0)
NEUTROS PCT: 81 %
Neutro Abs: 16.5 10*3/uL — ABNORMAL HIGH (ref 1.7–7.7)
PLATELETS: ADEQUATE 10*3/uL (ref 150–400)
RBC: 4.54 MIL/uL (ref 3.87–5.11)
RDW: 15 % (ref 11.5–15.5)
SMEAR REVIEW: ADEQUATE
WBC: 20.3 10*3/uL — AB (ref 4.0–10.5)

## 2015-10-10 LAB — COMPREHENSIVE METABOLIC PANEL
ALT: 15 U/L (ref 14–54)
AST: 20 U/L (ref 15–41)
Albumin: 3.7 g/dL (ref 3.5–5.0)
Alkaline Phosphatase: 61 U/L (ref 38–126)
Anion gap: 12 (ref 5–15)
BUN: 36 mg/dL — ABNORMAL HIGH (ref 6–20)
CHLORIDE: 104 mmol/L (ref 101–111)
CO2: 23 mmol/L (ref 22–32)
Calcium: 8.8 mg/dL — ABNORMAL LOW (ref 8.9–10.3)
Creatinine, Ser: 1.47 mg/dL — ABNORMAL HIGH (ref 0.44–1.00)
GFR, EST AFRICAN AMERICAN: 35 mL/min — AB (ref >60)
GFR, EST NON AFRICAN AMERICAN: 30 mL/min — AB (ref >60)
Glucose, Bld: 102 mg/dL — ABNORMAL HIGH (ref 65–99)
POTASSIUM: 2.4 mmol/L — AB (ref 3.5–5.1)
SODIUM: 139 mmol/L (ref 135–145)
Total Bilirubin: 2.1 mg/dL — ABNORMAL HIGH (ref 0.3–1.2)
Total Protein: 6.8 g/dL (ref 6.5–8.1)

## 2015-10-10 LAB — LIPASE, BLOOD: LIPASE: 35 U/L (ref 11–51)

## 2015-10-10 LAB — URINE MICROSCOPIC-ADD ON

## 2015-10-10 LAB — I-STAT CG4 LACTIC ACID, ED: Lactic Acid, Venous: 1.46 mmol/L (ref 0.5–2.0)

## 2015-10-10 MED ORDER — SODIUM CHLORIDE 0.9 % IV BOLUS (SEPSIS)
500.0000 mL | Freq: Once | INTRAVENOUS | Status: AC
  Administered 2015-10-10: 500 mL via INTRAVENOUS

## 2015-10-10 MED ORDER — DIATRIZOATE MEGLUMINE & SODIUM 66-10 % PO SOLN: 15.0000 mL | ORAL | Status: DC | PRN

## 2015-10-10 MED ORDER — POTASSIUM CHLORIDE CRYS ER 20 MEQ PO TBCR
40.0000 meq | EXTENDED_RELEASE_TABLET | Freq: Once | ORAL | Status: AC
  Administered 2015-10-10: 40 meq via ORAL
  Filled 2015-10-10: qty 2

## 2015-10-10 NOTE — ED Provider Notes (Signed)
CSN: [id number]     Arrival date & time 10/10/15  1801 History   First MD Initiated Contact with Patient 10/10/15 1950     Chief Complaint  Patient presents with  . Abdominal Pain     (Consider location/radiation/quality/duration/timing/severity/associated sxs/prior Treatment) HPI Comments: Name 82-year-old female who completed a second course of antibiotics for chronic leg wound.  One day ago.  Today she developed 3 loose stools with intermittent mild left lower quadrant. She does have a history of IBS and diverticulosis but has never been treated for any diverticulitis.  She went back to her doctor today because of the loose stools who did blood work and she was found to have an elevated white count, so they sent her to the emergency room for further evaluation. Denies any dysuria, shortness of breath, fever, chills, cough.  Patient is a 82 y.o. female presenting with abdominal pain. The history is provided by the patient.  Abdominal Pain Pain location:  LLQ Pain quality: cramping   Pain radiates to:  Does not radiate Pain severity:  Mild Onset quality:  Gradual Duration:  1 day Timing:  Intermittent Progression:  Unchanged Chronicity:  New Relieved by:  Nothing Worsened by:  Nothing tried Ineffective treatments:  None tried Associated symptoms: diarrhea   Associated symptoms: no chills, no cough, no dysuria and no fever     Past Medical History  Diagnosis Date  . Hypertension   . Hyperlipidemia   . Gilbert disease   . IBS (irritable bowel syndrome)   . Osteopenia   . Endometrial carcinoma ([place])   . Diverticulosis   . Vitamin D deficiency   . GERD (gastroesophageal reflux disease)   . Acute sinusitis, unspecified    History reviewed. No pertinent past surgical history. History reviewed. No pertinent family history. Social History  Substance Use Topics  . Smoking status: None  . Smokeless tobacco: None  . Alcohol Use: None   OB History    No data available      Review of Systems  Constitutional: Negative for fever and chills.  Respiratory: Negative for cough.   Gastrointestinal: Positive for abdominal pain and diarrhea.  Genitourinary: Negative for dysuria.  Skin: Positive for wound.  All other systems reviewed and are negative.     Allergies  Tenoretic  Home Medications   Prior to Admission medications   Medication Sig Start Date End Date Taking? Authorizing Provider  amLODipine (NORVASC) 5 MG tablet Take 5 mg by mouth daily.   Yes Historical Provider, MD  hydrochlorothiazide (MICROZIDE) 12.5 MG capsule Take 12.5 mg by mouth daily.   Yes Historical Provider, MD  ibuprofen (ADVIL,MOTRIN) 200 MG tablet Take 200 mg by mouth every 6 (six) hours as needed.   Yes Historical Provider, MD  raloxifene (EVISTA) 60 MG tablet Take 60 mg by mouth daily.   Yes Historical Provider, MD  simvastatin (ZOCOR) 10 MG tablet Take 10 mg by mouth daily.   Yes Historical Provider, MD  predniSONE (DELTASONE) 10 MG tablet Take 10 mg by mouth daily as needed (inflammation).     Historical Provider, MD   BP 143/61 mmHg  Pulse 71  Temp(Src) 98 F (36.7 C) (Oral)  Resp 14  SpO2 94% Physical Exam  Constitutional: She appears well-developed and well-nourished.  HENT:  Head: Normocephalic.  Eyes: Pupils are equal, round, and reactive to light.  Neck: Normal range of motion.  Cardiovascular: Normal rate and regular rhythm.   Pulmonary/Chest: Effort normal and breath sounds normal. She has  no wheezes. She exhibits no tenderness.  Abdominal: Soft. Bowel sounds are normal. She exhibits no distension. There is tenderness in the left lower quadrant. There is no rigidity and no guarding.  Musculoskeletal: Normal range of motion.       Legs: Neurological: She is alert.  Skin: Skin is warm.  Psychiatric: She has a normal mood and affect.  Nursing note and vitals reviewed.   ED Course  Procedures (including critical care time) Labs Review Labs Reviewed   COMPREHENSIVE METABOLIC PANEL - Abnormal; Notable for the following:    Potassium 2.4 (*)    Glucose, Bld 102 (*)    BUN 36 (*)    Creatinine, Ser 1.47 (*)    Calcium 8.8 (*)    Total Bilirubin 2.1 (*)    GFR calc non Af Amer 30 (*)    GFR calc Af Amer 35 (*)    All other components within normal limits  URINALYSIS, ROUTINE W REFLEX MICROSCOPIC (NOT AT [facility]) - Abnormal; Notable for the following:    Color, Urine AMBER (*)    APPearance TURBID (*)    Protein, ur 100 (*)    Leukocytes, UA MODERATE (*)    All other components within normal limits  CBC WITH DIFFERENTIAL/PLATELET - Abnormal; Notable for the following:    WBC 20.3 (*)    Neutro Abs 16.5 (*)    Monocytes Absolute 1.6 (*)    All other components within normal limits  URINE MICROSCOPIC-ADD ON - Abnormal; Notable for the following:    Squamous Epithelial / LPF 0-5 (*)    Bacteria, UA FEW (*)    Casts HYALINE CASTS (*)    All other components within normal limits  LIPASE, BLOOD  I-STAT CG4 LACTIC ACID, ED  I-STAT CG4 LACTIC ACID, ED    Imaging Review Ct Abdomen Pelvis Wo Contrast  10/11/2015  CLINICAL DATA:  82 year old female with left lower quadrant abdominal pain and diarrhea. EXAM: CT ABDOMEN AND PELVIS WITHOUT CONTRAST TECHNIQUE: Multidetector CT imaging of the abdomen and pelvis was performed following the standard protocol without IV contrast. COMPARISON:  None. FINDINGS: Evaluation of this exam is limited in the absence of intravenous contrast. The visualized lung bases are clear. No intra-abdominal free air or free fluid noted. There is slight irregularity of the hepatic contour. The liver is otherwise unremarkable on this noncontrast study. The gallbladder appears unremarkable. A 5 mm calcific focus in the porta hepaticus likely represents vascular calcification and less likely a stone within the cystic duct. There is no gallbladder distention or pericholecystic fluid. The pancreas, spleen, and adrenal glands appear  unremarkable. Punctate nonobstructing left renal inferior pole calculi. No hydronephrosis. There is no hydronephrosis or nephrolithiasis the right. Subcentimeter bilateral renal cortical and parenchymal high attenuating lesions are not well characterized but likely represent hemorrhagic/proteinaceous cysts. Ultrasound may provide better characterization. The visualized ureters and urinary bladder appear unremarkable. Hysterectomy. Small hiatal hernia. There is sigmoid diverticulosis. There is diffuse thickened appearance of the cecum and colonic wall as well as thickened appearance of the rectosigmoid. There is more pronounced pericolonic haziness surrounding the sigmoid diverticula. Findings likely represent colitis versus less likely sigmoid diverticulitis. Correlation with clinical exam and stool cultures recommended. There is no evidence of bowel obstruction. The appendix is not visualized with certainty. No inflammatory changes identified in the right lower quadrant. Advanced aortoiliac atherosclerotic disease. The aorta is tortuous. No portal venous gas identified. Evaluation of the vasculature is limited on this noncontrast study. The abdominal wall  soft tissues appear unremarkable. There is osteopenia with extensive degenerative changes of the spine. There is grade 1 L2-L3 retrolisthesis. IMPRESSION: Pancolitis versus less likely sigmoid diverticulitis. Correlation with clinical exam and stool cultures recommended. No bowel obstruction. Electronically Signed   By: Anner Crete M.D.   On: 10/11/2015 00:21   I have personally reviewed and evaluated these images and lab results as part of my medical decision-making.   EKG Interpretation   Date/Time:  Thursday October 10 2015 21:16:21 EDT Ventricular Rate:  74 PR Interval:  192 QRS Duration: 102 QT Interval:  444 QTC Calculation: 493 R Axis:   -100 Text Interpretation:  Sinus rhythm Probable left atrial enlargement Left  anterior fascicular block  Low voltage, precordial leads RSR' in V1 or V2,  right VCD or RVH Consider anterior infarct Confirmed by GOLDSTON MD, SCOTT  [phone]) on 10/10/2015 9:24:00 PM     Patient CT scan shows that she has pancolitis as well as diverticulitis she has been started on IV Cipro and Flagyl she'll be admitted to the hospital for further monitoring. MDM   Final diagnoses:  Colitis  Diverticulitis of large intestine without perforation or abscess without bleeding         Junius Creamer, NP 10/11/15 0107  Sherwood Gambler, MD 10/14/15 [phone]

## 2015-10-10 NOTE — Progress Notes (Signed)
Patient noted as having [facility] without a pcp.  EDCM spoke to patient at bedside.  Patient lives at home alone.  Patient has a very large family who are very supportive of her.  Patient does not use dme at home.  She reports she has equipment at home in the garage left over from her husband. Patient reports she is able to perform her own ADL's and is able to cook for herself.   Patient confirms her pcp is Dr. Antony Blackbird.  EDCM provided patient with list of home health agencies in [facility] and explained services.  EDCM explained to patient that she may not meet criteria for home health services as the patient is not home bound, she drives.  [facility] discussed patient with NP who reports patient has a wound care clinic appointment next week.  No further EDCM needs at this time.

## 2015-10-10 NOTE — ED Notes (Signed)
NP Baker Janus made aware of potassium of 2.4 at this time.

## 2015-10-10 NOTE — ED Notes (Signed)
Pt reports abd pain and fever for the past several days. Went to PCP who saw she had WBC of 22. Sent her here. Also complains of a non-healing leg wound.

## 2015-10-11 ENCOUNTER — Encounter (HOSPITAL_COMMUNITY): Payer: Self-pay | Admitting: Family Medicine

## 2015-10-11 ENCOUNTER — Emergency Department (HOSPITAL_COMMUNITY): Payer: Medicare HMO

## 2015-10-11 ENCOUNTER — Other Ambulatory Visit: Payer: Medicare HMO

## 2015-10-11 DIAGNOSIS — I10 Essential (primary) hypertension: Secondary | ICD-10-CM | POA: Diagnosis present

## 2015-10-11 DIAGNOSIS — R7989 Other specified abnormal findings of blood chemistry: Secondary | ICD-10-CM | POA: Diagnosis present

## 2015-10-11 DIAGNOSIS — E785 Hyperlipidemia, unspecified: Secondary | ICD-10-CM | POA: Diagnosis present

## 2015-10-11 DIAGNOSIS — E876 Hypokalemia: Secondary | ICD-10-CM | POA: Diagnosis present

## 2015-10-11 DIAGNOSIS — S81802A Unspecified open wound, left lower leg, initial encounter: Secondary | ICD-10-CM | POA: Diagnosis present

## 2015-10-11 DIAGNOSIS — K219 Gastro-esophageal reflux disease without esophagitis: Secondary | ICD-10-CM | POA: Diagnosis present

## 2015-10-11 DIAGNOSIS — A09 Infectious gastroenteritis and colitis, unspecified: Secondary | ICD-10-CM | POA: Diagnosis present

## 2015-10-11 DIAGNOSIS — Z8542 Personal history of malignant neoplasm of other parts of uterus: Secondary | ICD-10-CM | POA: Diagnosis not present

## 2015-10-11 DIAGNOSIS — K589 Irritable bowel syndrome without diarrhea: Secondary | ICD-10-CM | POA: Diagnosis present

## 2015-10-11 DIAGNOSIS — L97829 Non-pressure chronic ulcer of other part of left lower leg with unspecified severity: Secondary | ICD-10-CM | POA: Diagnosis present

## 2015-10-11 DIAGNOSIS — Z79899 Other long term (current) drug therapy: Secondary | ICD-10-CM | POA: Diagnosis not present

## 2015-10-11 DIAGNOSIS — Z7952 Long term (current) use of systemic steroids: Secondary | ICD-10-CM | POA: Diagnosis not present

## 2015-10-11 DIAGNOSIS — N179 Acute kidney failure, unspecified: Secondary | ICD-10-CM | POA: Diagnosis present

## 2015-10-11 DIAGNOSIS — K529 Noninfective gastroenteritis and colitis, unspecified: Secondary | ICD-10-CM | POA: Diagnosis present

## 2015-10-11 DIAGNOSIS — R1032 Left lower quadrant pain: Secondary | ICD-10-CM | POA: Diagnosis not present

## 2015-10-11 DIAGNOSIS — C541 Malignant neoplasm of endometrium: Secondary | ICD-10-CM

## 2015-10-11 DIAGNOSIS — N1831 Chronic kidney disease, stage 3a: Secondary | ICD-10-CM

## 2015-10-11 DIAGNOSIS — E559 Vitamin D deficiency, unspecified: Secondary | ICD-10-CM | POA: Diagnosis present

## 2015-10-11 DIAGNOSIS — M8580 Other specified disorders of bone density and structure, unspecified site: Secondary | ICD-10-CM | POA: Diagnosis present

## 2015-10-11 DIAGNOSIS — Z888 Allergy status to other drugs, medicaments and biological substances status: Secondary | ICD-10-CM | POA: Diagnosis not present

## 2015-10-11 DIAGNOSIS — N289 Disorder of kidney and ureter, unspecified: Secondary | ICD-10-CM

## 2015-10-11 LAB — C DIFFICILE QUICK SCREEN W PCR REFLEX
C DIFFICILE (CDIFF) INTERP: NEGATIVE
C DIFFICLE (CDIFF) ANTIGEN: NEGATIVE
C Diff toxin: NEGATIVE

## 2015-10-11 LAB — I-STAT CG4 LACTIC ACID, ED: LACTIC ACID, VENOUS: 1.21 mmol/L (ref 0.5–2.0)

## 2015-10-11 LAB — BASIC METABOLIC PANEL
ANION GAP: 9 (ref 5–15)
Anion gap: 16 — ABNORMAL HIGH (ref 5–15)
BUN: 32 mg/dL — ABNORMAL HIGH (ref 6–20)
BUN: 26 mg/dL — AB (ref 6–20)
CALCIUM: 8.6 mg/dL — AB (ref 8.9–10.3)
CO2: 15 mmol/L — AB (ref 22–32)
CO2: 19 mmol/L — ABNORMAL LOW (ref 22–32)
CREATININE: 1.23 mg/dL — AB (ref 0.44–1.00)
Calcium: 8.2 mg/dL — ABNORMAL LOW (ref 8.9–10.3)
Chloride: 108 mmol/L (ref 101–111)
Chloride: 112 mmol/L — ABNORMAL HIGH (ref 101–111)
Creatinine, Ser: 0.94 mg/dL (ref 0.44–1.00)
GFR, EST AFRICAN AMERICAN: 44 mL/min — AB (ref >60)
GFR, EST NON AFRICAN AMERICAN: 38 mL/min — AB (ref >60)
GFR, EST NON AFRICAN AMERICAN: 52 mL/min — AB (ref >60)
Glucose, Bld: 87 mg/dL (ref 65–99)
Glucose, Bld: 87 mg/dL (ref 65–99)
POTASSIUM: 3.8 mmol/L (ref 3.5–5.1)
Potassium: 3 mmol/L — ABNORMAL LOW (ref 3.5–5.1)
SODIUM: 139 mmol/L (ref 135–145)
SODIUM: 140 mmol/L (ref 135–145)

## 2015-10-11 LAB — GASTROINTESTINAL PANEL BY PCR, STOOL (REPLACES STOOL CULTURE)
ADENOVIRUS F40/41: NOT DETECTED
ASTROVIRUS: NOT DETECTED
CAMPYLOBACTER SPECIES: NOT DETECTED
Cryptosporidium: NOT DETECTED
Cyclospora cayetanensis: NOT DETECTED
E. coli O157: NOT DETECTED
ENTEROAGGREGATIVE E COLI (EAEC): NOT DETECTED
ENTEROPATHOGENIC E COLI (EPEC): NOT DETECTED
ENTEROTOXIGENIC E COLI (ETEC): NOT DETECTED
Entamoeba histolytica: NOT DETECTED
GIARDIA LAMBLIA: NOT DETECTED
NOROVIRUS GI/GII: NOT DETECTED
PLESIMONAS SHIGELLOIDES: NOT DETECTED
Rotavirus A: NOT DETECTED
SHIGA LIKE TOXIN PRODUCING E COLI (STEC): NOT DETECTED
Salmonella species: NOT DETECTED
Sapovirus (I, II, IV, and V): NOT DETECTED
Shigella/Enteroinvasive E coli (EIEC): NOT DETECTED
Vibrio cholerae: NOT DETECTED
Vibrio species: NOT DETECTED
Yersinia enterocolitica: NOT DETECTED

## 2015-10-11 LAB — PROCALCITONIN: PROCALCITONIN: 0.29 ng/mL

## 2015-10-11 LAB — PLATELET COUNT: PLATELETS: 148 10*3/uL — AB (ref 150–400)

## 2015-10-11 LAB — MAGNESIUM
MAGNESIUM: 2.1 mg/dL (ref 1.7–2.4)
Magnesium: 2 mg/dL (ref 1.7–2.4)

## 2015-10-11 LAB — CBC
HCT: 37.4 % (ref 36.0–46.0)
Hemoglobin: 12.9 g/dL (ref 12.0–15.0)
MCH: 30.9 pg (ref 26.0–34.0)
MCHC: 34.5 g/dL (ref 30.0–36.0)
MCV: 89.5 fL (ref 78.0–100.0)
PLATELETS: 158 10*3/uL (ref 150–400)
RBC: 4.18 MIL/uL (ref 3.87–5.11)
RDW: 15.3 % (ref 11.5–15.5)
WBC: 13.3 10*3/uL — AB (ref 4.0–10.5)

## 2015-10-11 MED ORDER — HEPARIN SODIUM (PORCINE) 5000 UNIT/ML IJ SOLN
5000.0000 [IU] | Freq: Three times a day (TID) | INTRAMUSCULAR | Status: DC
  Administered 2015-10-11 – 2015-10-12 (×4): 5000 [IU] via SUBCUTANEOUS
  Filled 2015-10-11 (×7): qty 1

## 2015-10-11 MED ORDER — POTASSIUM CHLORIDE IN NACL 40-0.9 MEQ/L-% IV SOLN
INTRAVENOUS | Status: DC
  Administered 2015-10-11: 75 mL/h via INTRAVENOUS
  Filled 2015-10-11: qty 1000

## 2015-10-11 MED ORDER — ONDANSETRON HCL 4 MG PO TABS: 4.0000 mg | ORAL_TABLET | Freq: Four times a day (QID) | ORAL | Status: DC | PRN

## 2015-10-11 MED ORDER — ONDANSETRON HCL 4 MG/2ML IJ SOLN: 4.0000 mg | Freq: Four times a day (QID) | INTRAMUSCULAR | Status: DC | PRN

## 2015-10-11 MED ORDER — METRONIDAZOLE IN NACL 5-0.79 MG/ML-% IV SOLN
500.0000 mg | Freq: Three times a day (TID) | INTRAVENOUS | Status: DC
  Administered 2015-10-11 – 2015-10-12 (×4): 500 mg via INTRAVENOUS
  Filled 2015-10-11 (×5): qty 100

## 2015-10-11 MED ORDER — HYDROCODONE-ACETAMINOPHEN 5-325 MG PO TABS: 1.0000 | ORAL_TABLET | ORAL | Status: DC | PRN

## 2015-10-11 MED ORDER — CIPROFLOXACIN IN D5W 400 MG/200ML IV SOLN
400.0000 mg | Freq: Two times a day (BID) | INTRAVENOUS | Status: DC
  Administered 2015-10-11 (×3): 400 mg via INTRAVENOUS
  Filled 2015-10-11 (×4): qty 200

## 2015-10-11 MED ORDER — RALOXIFENE HCL 60 MG PO TABS
60.0000 mg | ORAL_TABLET | Freq: Every day | ORAL | Status: DC
  Administered 2015-10-11: 60 mg via ORAL
  Filled 2015-10-11 (×2): qty 1

## 2015-10-11 MED ORDER — METRONIDAZOLE IN NACL 5-0.79 MG/ML-% IV SOLN
500.0000 mg | Freq: Once | INTRAVENOUS | Status: DC
  Administered 2015-10-11: 500 mg via INTRAVENOUS
  Filled 2015-10-11: qty 100

## 2015-10-11 MED ORDER — ACETAMINOPHEN 325 MG PO TABS: 650.0000 mg | ORAL_TABLET | Freq: Four times a day (QID) | ORAL | Status: DC | PRN

## 2015-10-11 MED ORDER — AMLODIPINE BESYLATE 5 MG PO TABS
5.0000 mg | ORAL_TABLET | Freq: Every day | ORAL | Status: DC
  Administered 2015-10-11: 5 mg via ORAL
  Filled 2015-10-11 (×2): qty 1

## 2015-10-11 MED ORDER — ACETAMINOPHEN 650 MG RE SUPP: 650.0000 mg | Freq: Four times a day (QID) | RECTAL | Status: DC | PRN

## 2015-10-11 MED ORDER — SIMVASTATIN 10 MG PO TABS
10.0000 mg | ORAL_TABLET | Freq: Every day | ORAL | Status: DC
Start: 2015-10-11 — End: 2015-10-12
  Administered 2015-10-11: 10 mg via ORAL
  Filled 2015-10-11 (×2): qty 1

## 2015-10-11 MED ORDER — CIPROFLOXACIN IN D5W 400 MG/200ML IV SOLN
400.0000 mg | Freq: Once | INTRAVENOUS | Status: DC
  Filled 2015-10-11: qty 200

## 2015-10-11 MED ORDER — COLLAGENASE 250 UNIT/GM EX OINT
TOPICAL_OINTMENT | Freq: Every day | CUTANEOUS | Status: DC
  Administered 2015-10-11: 15:00:00 via TOPICAL
  Filled 2015-10-11: qty 30

## 2015-10-11 NOTE — Progress Notes (Signed)
WOC wound consult note Reason for Consult: Evaluate non-healing wound to left lower leg Wound type:Full thickness wound to left lower leg secondary to truama as per patient Pressure Ulcer POA: /No Measurement:1.6 cm x 1.5 cm. Unable to determine depth due to slough Wound bed:100% slough Drainage (amount, consistency, odor) Minimal amount of serosanguinous Periwound:Intact Dressing procedure/placement/frequency:Cleanse left lower leg wound with normal saline and pat dry. Apply dime size amount of Santyl Ointment to wound bed, cover with dry 4x4 and secure with tape. Change dressing daily.   Re consult if needed, will not follow at this time. Thanks, Melba Coon MSN, RN, [employer]

## 2015-10-11 NOTE — Care Management Note (Signed)
Case Management Note  Patient Details  Name: [name] MRN: [id number] Date of Birth: 1923-12-31  Subjective/Objective:          pancolitis with diverticulitis          Action/Plan:Date:  October 11, 2015 Chart reviewed for concurrent status and case management needs. Will continue to follow the patient for changes and needs: Expected discharge date: [id number] Velva Harman, BSN, [place], [place]   Expected Discharge Date:                  Expected Discharge Plan:  Home/Self Care  In-House Referral:  NA  Discharge planning Services  CM Consult, NA  Post Acute Care Choice:  NA Choice offered to:  NA  DME Arranged:    DME Agency:     HH Arranged:    HH Agency:     Status of Service:  In process, will continue to follow  Medicare Important Message Given:    Date Medicare IM Given:    Medicare IM give by:    Date Additional Medicare IM Given:    Additional Medicare Important Message give by:     If discussed at [place] of Stay Meetings, dates discussed:    Additional Comments:  Leeroy Cha, RN 10/11/2015, 10:33 AM

## 2015-10-11 NOTE — H&P (Signed)
History and Physical    BREKKEN FANG [id number] DOB: [birth year] DOA: 10/10/2015  PCP: Antony Blackbird, MD   Patient coming from: Home   Chief Complaint: Abdominal pain, fever, diarrhea   HPI: Brianna Bush is a 82 y.o. female with medical history significant for endometrial cancer status post hysterectomy, hypertension, hyperlipidemia, Gilbert disease, diverticulosis, and wound on the left lower extremity who presents to the ED with 2 days of left lower quadrant abdominal pain, fever, and watery diarrhea. Patient has a very slowly healing wound on the anterior aspect of the left lower extremity at the level of mid shin and has just completed her second course of antibiotics 2 days ago. Yesterday, she noted a fairly acute onset of abdominal pain with watery diarrhea and fever. She saw her primary care physician for these complaints and was evaluated with some basic blood work which returned notable for a leukocytosis to 22,000. She reports having a CBC drawn on 10/04/2015 with WBC in the 8,000 range. Given the leukocytosis and the patient's constellation of complaints, she was directed to the emergency department for further evaluation. Of note, she reports that the leg wound has been healing quite nicely over the last week or so. She endorses subjective fevers yesterday, but not today. She denies chest pain, palpitations, dyspnea, or cough. She also denies nausea or vomiting. There is no dysuria or hematuria.  ED Course: Upon arrival to the ED, patient is found to be afebrile, saturating well on room air, and with vital signs stable. EKG features a sinus rhythm with left anterior fascicular block, low-voltage QRS, and RSR' in V1. Chemistry panel features a serum potassium of 2.4, BUN of 36, and serum creatinine of 1.47 with no prior measurement for comparison. CBC features a leukocytosis to [id number] with neutrophilic predominance and clumped platelets. Lactic acid falls within the normal limits  at 1.46. A 500 mL normal saline bolus was administered in the emergency department and potassium was supplemented with 40 mEq orally. Empiric treatment was initiated in the ED with intravenous ciprofloxacin and Flagyl. Patient remained hemodynamically stable in the emergency department and will be admitted to the hospital for ongoing evaluation and management of intra-abdominal infection concerning for diverticulitis versus colitis, possibly secondary to C. difficile.  Review of Systems:  All other systems reviewed and apart from HPI, are negative.  Past Medical History  Diagnosis Date  . Hypertension   . Hyperlipidemia   . Gilbert disease   . IBS (irritable bowel syndrome)   . Osteopenia   . Endometrial carcinoma ([place])   . Diverticulosis   . Vitamin D deficiency   . GERD (gastroesophageal reflux disease)   . Acute sinusitis, unspecified     History reviewed. No pertinent past surgical history.   has no tobacco, alcohol, and drug history on file.  Allergies  Allergen Reactions  . Tenoretic [Atenolol-Chlorthalidone]     bradycardia    History reviewed. No pertinent family history.   Prior to Admission medications   Medication Sig Start Date End Date Taking? Authorizing Provider  amLODipine (NORVASC) 5 MG tablet Take 5 mg by mouth daily.   Yes Historical Provider, MD  hydrochlorothiazide (MICROZIDE) 12.5 MG capsule Take 12.5 mg by mouth daily.   Yes Historical Provider, MD  ibuprofen (ADVIL,MOTRIN) 200 MG tablet Take 200 mg by mouth every 6 (six) hours as needed.   Yes Historical Provider, MD  raloxifene (EVISTA) 60 MG tablet Take 60 mg by mouth daily.   Yes Historical Provider, MD  simvastatin (ZOCOR) 10 MG tablet Take 10 mg by mouth daily.   Yes Historical Provider, MD  predniSONE (DELTASONE) 10 MG tablet Take 10 mg by mouth daily as needed (inflammation).     Historical Provider, MD    Physical Exam: Filed Vitals:   10/10/15 1832 10/10/15 2041 10/10/15 2330 10/11/15 0037    BP: 129/73 141/77 133/66 143/61  Pulse: 77 76 77 71  Temp: 98 F (36.7 C)     TempSrc: Oral     Resp: 16 16 16 14   SpO2: 97% 96% 88% 94%      Constitutional: NAD, calm, comfortable Eyes: PERTLA, lids and conjunctivae normal ENMT: Mucous membranes are moist. Posterior pharynx clear of any exudate or lesions.   Neck: normal, supple, no masses, no thyromegaly Respiratory: clear to auscultation bilaterally, no wheezing, no crackles. Normal respiratory effort.    Cardiovascular: S1 & S2 heard, regular rate and rhythm, no significant murmurs / rubs / gallops. No extremity edema.   Abdomen: No distension, tender in lower quadrants Lt > Rt, no rebound pain or guarding, no masses palpated. Bowel sounds normal.  Musculoskeletal: no clubbing / cyanosis. No joint deformity upper and lower extremities. Normal muscle tone.  Skin: ~2cm ulcer overlies LLE anteriorly at mid-shin with no significant surrounding erythema or edema; no drainage or foul odor. Skin otherwise  warm, dry, well-perfused. Neurologic: CN 2-12 grossly intact. Sensation intact, DTR normal. Strength 5/5 in all 4 limbs.  Psychiatric: Normal judgment and insight. Alert and oriented x 3. Normal mood and affect.     Labs on Admission: I have personally reviewed following labs and imaging studies  CBC:  Recent Labs Lab 10/10/15 2004  WBC 20.3*  NEUTROABS 16.5*  HGB 13.9  HCT 39.8  MCV 87.7  PLT PLATELET CLUMPS NOTED ON SMEAR, COUNT APPEARS ADEQUATE   Basic Metabolic Panel:  Recent Labs Lab 10/10/15 2004  NA 139  K 2.4*  CL 104  CO2 23  GLUCOSE 102*  BUN 36*  CREATININE 1.47*  CALCIUM 8.8*   GFR: CrCl cannot be calculated (Unknown ideal weight.). Liver Function Tests:  Recent Labs Lab 10/10/15 2004  AST 20  ALT 15  ALKPHOS 61  BILITOT 2.1*  PROT 6.8  ALBUMIN 3.7    Recent Labs Lab 10/10/15 2004  LIPASE 35   No results for input(s): AMMONIA in the last 168 hours. Coagulation Profile: No results  for input(s): INR, PROTIME in the last 168 hours. Cardiac Enzymes: No results for input(s): CKTOTAL, CKMB, CKMBINDEX, TROPONINI in the last 168 hours. BNP (last 3 results) No results for input(s): PROBNP in the last 8760 hours. HbA1C: No results for input(s): HGBA1C in the last 72 hours. CBG: No results for input(s): GLUCAP in the last 168 hours. Lipid Profile: No results for input(s): CHOL, HDL, LDLCALC, TRIG, CHOLHDL, LDLDIRECT in the last 72 hours. Thyroid Function Tests: No results for input(s): TSH, T4TOTAL, FREET4, T3FREE, THYROIDAB in the last 72 hours. Anemia Panel: No results for input(s): VITAMINB12, FOLATE, FERRITIN, TIBC, IRON, RETICCTPCT in the last 72 hours. Urine analysis:    Component Value Date/Time   COLORURINE AMBER* 10/10/2015 2013   APPEARANCEUR TURBID* 10/10/2015 2013   LABSPEC 1.024 10/10/2015 2013   PHURINE 5.0 10/10/2015 2013   GLUCOSEU NEGATIVE 10/10/2015 2013   HGBUR NEGATIVE 10/10/2015 2013   BILIRUBINUR NEGATIVE 10/10/2015 2013   KETONESUR NEGATIVE 10/10/2015 2013   PROTEINUR 100* 10/10/2015 2013   NITRITE NEGATIVE 10/10/2015 2013   LEUKOCYTESUR MODERATE* 10/10/2015 2013  Sepsis Labs: @LABRCNTIP (procalcitonin:4,lacticidven:4) )No results found for this or any previous visit (from the past 240 hour(s)).   Radiological Exams on Admission: Ct Abdomen Pelvis Wo Contrast  10/11/2015  CLINICAL DATA:  82 year old female with left lower quadrant abdominal pain and diarrhea. EXAM: CT ABDOMEN AND PELVIS WITHOUT CONTRAST TECHNIQUE: Multidetector CT imaging of the abdomen and pelvis was performed following the standard protocol without IV contrast. COMPARISON:  None. FINDINGS: Evaluation of this exam is limited in the absence of intravenous contrast. The visualized lung bases are clear. No intra-abdominal free air or free fluid noted. There is slight irregularity of the hepatic contour. The liver is otherwise unremarkable on this noncontrast study. The gallbladder  appears unremarkable. A 5 mm calcific focus in the porta hepaticus likely represents vascular calcification and less likely a stone within the cystic duct. There is no gallbladder distention or pericholecystic fluid. The pancreas, spleen, and adrenal glands appear unremarkable. Punctate nonobstructing left renal inferior pole calculi. No hydronephrosis. There is no hydronephrosis or nephrolithiasis the right. Subcentimeter bilateral renal cortical and parenchymal high attenuating lesions are not well characterized but likely represent hemorrhagic/proteinaceous cysts. Ultrasound may provide better characterization. The visualized ureters and urinary bladder appear unremarkable. Hysterectomy. Small hiatal hernia. There is sigmoid diverticulosis. There is diffuse thickened appearance of the cecum and colonic wall as well as thickened appearance of the rectosigmoid. There is more pronounced pericolonic haziness surrounding the sigmoid diverticula. Findings likely represent colitis versus less likely sigmoid diverticulitis. Correlation with clinical exam and stool cultures recommended. There is no evidence of bowel obstruction. The appendix is not visualized with certainty. No inflammatory changes identified in the right lower quadrant. Advanced aortoiliac atherosclerotic disease. The aorta is tortuous. No portal venous gas identified. Evaluation of the vasculature is limited on this noncontrast study. The abdominal wall soft tissues appear unremarkable. There is osteopenia with extensive degenerative changes of the spine. There is grade 1 L2-L3 retrolisthesis. IMPRESSION: Pancolitis versus less likely sigmoid diverticulitis. Correlation with clinical exam and stool cultures recommended. No bowel obstruction. Electronically Signed   By: Anner Crete M.D.   On: 10/11/2015 00:21    EKG: Independently reviewed. Sinus rhythm, LaFB, low-voltage QRS, RSR' in V1   Assessment/Plan  1. Acute pancolitis vs  diverticulitis  - Presents with reported fever at home, leukocytosis to 20,300 and colitis on CT  - CT findings suggest pancolitis over sigmoid diverticulitis  - Given the recent abx use and constellation of findings, there is concern for C diff  - Empiric IV Cipro and IV Flagyl initiated in the ED, will continue for now, covering for both mild-mod C diff and diverticulitis  - GI pathogen panel ordered  - Infection control measures with enteric precautions, hand-washing     2. Hypokalemia  - Serum potassium 2.4 on admission - Suspected secondary to GI losses and HCTZ use  - Hold HCTZ, consider discontinuing  - 40 mEq given PO in ED, will continue repletion with addition of K to IVF  - Check mag level and replace prn  - Repeat chem panel tomorrow   3. Kidney disease of uncertain chronicity  - SCr 1.47 with no prior for comparison  - AKI is certainly possible in setting of acute infection  - Given a 500 cc NS bolus in ED, will continue gentle IV hydration with NS at 75 cc/hr  - Avoid nephrotoxins, hold her home ibuprofen  - Repeat chem panel tomorrow    4. Hypertension  - At goal on admission  -  Managed with Norvasc and HCTZ at home  - Holding HCTZ in light of hypokalemia, continuing Norvasc    5. Hyperlipidemia - Continue current-management with Zocor   6. Left leg wound  - Healing, no sign of associated infection at time of admission  - Wound care consultation requested   7. Hx of endometrial cancer  - Status-post hysterectomy  - Continue on Evista    DVT prophylaxis: Plan for sq heparin pending platelet count   Code Status: Full  Family Communication: Daughter updated at bedside Disposition Plan: Admit to med/surg  Consults called: None   Admission status: Inpatient    Vianne Bulls, MD [facility] Pager [phone]  If 7PM-7AM, please contact night-coverage [email or web address] Password [email or web address]  10/11/2015, 1:30 AM

## 2015-10-11 NOTE — Progress Notes (Signed)
PROGRESS NOTE                                                                                                                                                                                                             Patient Demographics:    Brianna Bush, is a 82 y.o. female, DOB - [birth year], [id number]  Admit date - 10/10/2015   Admitting Physician Vianne Bulls, MD  Outpatient Primary MD for the patient is FULP, CAMMIE, MD  LOS - 0  Chief Complaint  Patient presents with  . Abdominal Pain       Brief Narrative  82 y.o female with a PMH of endometrial cancer s/p hysterectomy, HTN, hyperlipidemia, Gilbert disease, diverticulosis, LLE wound that presented to ED with fever, abdominal pain, and watery diarrhea. Recently finished a course of antibiotics on 6/6. CT on 6/9 suggest pancolitis over diverticulitis. Placed on Cipro and Flagyl empirically.   Subjective:    Brianna Bush today she is eating solid foods and states she is improving. No nausea or vomiting.    Assessment  & Plan :    Colitis: Likely infectious, given recent antibiotic use suspect C. Difficile. GI pathogen panel pending. Continue empiric Cipro/Flagyl-if C Diff confirmed-will switch to oral Vancomycin. Abdomen soft, she is easily tolerating a regular diet.  Hypokalemia: Secondary to GI loss. Has resolved and will continue to monitor electrolytes.  Acute Kidney Injury: likely ATN from acute GI. Has resolved with fluid replacement. Will discontinue IVFs. Continue to monitor.   Leukocytosis: secondary to Colitis, WBC 20,000 on admission. Resolving with antibiotics now WBC 13,300.   Hypertension: Continue home medication Norvasc and HCTZ.  Hyperlipidemia: Continue home medication of Zocor  Left leg wound: No signs of infection. Will monitor during stay.  Follow up with PCP.  Hx of Endometrial cancer: Status-post hysterectomy. Continue  home medication Evista  Code Status :  Full code  Family Communication  :  Daughter at beside  Disposition Plan  :  Await GI pathogen panel, possible discharge tomorrow if continues to improve.  Consults  :  None  Procedures  :  None  DVT Prophylaxis  :  Heparin  Lab Results  Component Value Date   PLT 158 10/11/2015    Inpatient Medications  Scheduled Meds: . amLODipine  5 mg Oral Daily  . ciprofloxacin  400  mg Intravenous BID  . heparin subcutaneous  5,000 Units Subcutaneous Q8H  . metronidazole  500 mg Intravenous Q8H  . raloxifene  60 mg Oral Daily  . simvastatin  10 mg Oral q1800   Continuous Infusions:  PRN Meds:.acetaminophen **OR** acetaminophen, diatrizoate meglumine-sodium, HYDROcodone-acetaminophen, ondansetron **OR** ondansetron (ZOFRAN) IV  Antibiotics  :    Anti-infectives    Start     Dose/Rate Route Frequency Ordered Stop   10/11/15 0800  metroNIDAZOLE (FLAGYL) IVPB 500 mg     500 mg 100 mL/hr over 60 Minutes Intravenous Every 8 hours 10/11/15 0128     10/11/15 0200  ciprofloxacin (CIPRO) IVPB 400 mg     400 mg 200 mL/hr over 60 Minutes Intravenous 2 times daily 10/11/15 0128     10/11/15 0100  ciprofloxacin (CIPRO) IVPB 400 mg  Status:  Discontinued     400 mg 200 mL/hr over 60 Minutes Intravenous  Once 10/11/15 0052 10/11/15 0128   10/11/15 0100  metroNIDAZOLE (FLAGYL) IVPB 500 mg  Status:  Discontinued     500 mg 100 mL/hr over 60 Minutes Intravenous  Once 10/11/15 0052 10/11/15 0128         Objective:   Filed Vitals:   10/11/15 0037 10/11/15 0153 10/11/15 0156 10/11/15 0428  BP: 143/61  130/88   Pulse: 71  73 82  Temp:   98.3 F (36.8 C) 98 F (36.7 C)  TempSrc:   Oral Oral  Resp: 14  18 18   Height:  5\' 2"  (1.575 m)    Weight:  68.947 kg (152 lb)    SpO2: 94%  96% 96%    Wt Readings from Last 3 Encounters:  10/11/15 68.947 kg (152 lb)    No intake or output data in the 24 hours ending 10/11/15 1013   Physical Exam  Awake  Alert, Oriented X 3, No new F.N deficits, Normal affect [place].AT,PERRAL Supple Neck,No JVD, No cervical lymphadenopathy appriciated.  Symmetrical Chest wall movement, Good air movement bilaterally, CTAB RRR,No Gallops,Rubs or new Murmurs, No Parasternal Heave +ve B.Sounds, Abd Soft, mild tenderness, No organomegaly appriciated, No rebound - guarding or rigidity. No Cyanosis, Clubbing or edema, No new Rash or bruise     Data Review:    CBC  Recent Labs Lab 10/10/15 2004 10/11/15 0210 10/11/15 0801  WBC 20.3*  --  13.3*  HGB 13.9  --  12.9  HCT 39.8  --  37.4  PLT PLATELET CLUMPS NOTED ON SMEAR, COUNT APPEARS ADEQUATE 148* 158  MCV 87.7  --  89.5  MCH 30.6  --  30.9  MCHC 34.9  --  34.5  RDW 15.0  --  15.3  LYMPHSABS 2.2  --   --   MONOABS 1.6*  --   --   EOSABS 0.0  --   --   BASOSABS 0.0  --   --     Chemistries   Recent Labs Lab 10/10/15 2004 10/11/15 0210 10/11/15 0801  NA 139 139 140  K 2.4* 3.0* 3.8  CL 104 108 112*  CO2 23 15* 19*  GLUCOSE 102* 87 87  BUN 36* 32* 26*  CREATININE 1.47* 1.23* 0.94  CALCIUM 8.8* 8.6* 8.2*  MG  --  2.0 2.1  AST 20  --   --   ALT 15  --   --   ALKPHOS 61  --   --   BILITOT 2.1*  --   --    ------------------------------------------------------------------------------------------------------------------ No results for input(s): CHOL,  HDL, LDLCALC, TRIG, CHOLHDL, LDLDIRECT in the last 72 hours.  No results found for: HGBA1C ------------------------------------------------------------------------------------------------------------------ No results for input(s): TSH, T4TOTAL, T3FREE, THYROIDAB in the last 72 hours.  Invalid input(s): FREET3 ------------------------------------------------------------------------------------------------------------------ No results for input(s): VITAMINB12, FOLATE, FERRITIN, TIBC, IRON, RETICCTPCT in the last 72 hours.  Coagulation profile No results for input(s): INR, PROTIME in the last  168 hours.  No results for input(s): DDIMER in the last 72 hours.  Cardiac Enzymes No results for input(s): CKMB, TROPONINI, MYOGLOBIN in the last 168 hours.  Invalid input(s): CK ------------------------------------------------------------------------------------------------------------------ No results found for: BNP  Micro Results No results found for this or any previous visit (from the past 240 hour(s)).  Radiology Reports Ct Abdomen Pelvis Wo Contrast  10/11/2015  CLINICAL DATA:  82 year old female with left lower quadrant abdominal pain and diarrhea. EXAM: CT ABDOMEN AND PELVIS WITHOUT CONTRAST TECHNIQUE: Multidetector CT imaging of the abdomen and pelvis was performed following the standard protocol without IV contrast. COMPARISON:  None. FINDINGS: Evaluation of this exam is limited in the absence of intravenous contrast. The visualized lung bases are clear. No intra-abdominal free air or free fluid noted. There is slight irregularity of the hepatic contour. The liver is otherwise unremarkable on this noncontrast study. The gallbladder appears unremarkable. A 5 mm calcific focus in the porta hepaticus likely represents vascular calcification and less likely a stone within the cystic duct. There is no gallbladder distention or pericholecystic fluid. The pancreas, spleen, and adrenal glands appear unremarkable. Punctate nonobstructing left renal inferior pole calculi. No hydronephrosis. There is no hydronephrosis or nephrolithiasis the right. Subcentimeter bilateral renal cortical and parenchymal high attenuating lesions are not well characterized but likely represent hemorrhagic/proteinaceous cysts. Ultrasound may provide better characterization. The visualized ureters and urinary bladder appear unremarkable. Hysterectomy. Small hiatal hernia. There is sigmoid diverticulosis. There is diffuse thickened appearance of the cecum and colonic wall as well as thickened appearance of the rectosigmoid.  There is more pronounced pericolonic haziness surrounding the sigmoid diverticula. Findings likely represent colitis versus less likely sigmoid diverticulitis. Correlation with clinical exam and stool cultures recommended. There is no evidence of bowel obstruction. The appendix is not visualized with certainty. No inflammatory changes identified in the right lower quadrant. Advanced aortoiliac atherosclerotic disease. The aorta is tortuous. No portal venous gas identified. Evaluation of the vasculature is limited on this noncontrast study. The abdominal wall soft tissues appear unremarkable. There is osteopenia with extensive degenerative changes of the spine. There is grade 1 L2-L3 retrolisthesis. IMPRESSION: Pancolitis versus less likely sigmoid diverticulitis. Correlation with clinical exam and stool cultures recommended. No bowel obstruction. Electronically Signed   By: Anner Crete M.D.   On: 10/11/2015 00:21    Time Spent in minutes  [place] on 10/11/2015 at 10:13 AM Attending MD note  Patient was seen, examined,treatment plan was discussed with the PA-S.  I have personally reviewed the clinical findings, lab, imaging studies and management of this patient in detail. I agree with the documentation, as recorded by the PA-S.   Patient is much better today, no abdominal pain, tolerating regular food this morning.  On Exam: Gen. exam: Awake, alert, not in any distress Chest: Good air entry bilaterally, no rhonchi or rales CVS: S1-S2 regular, no murmurs Abdomen: Soft, nontender and nondistended Neurology: Non-focal Skin: No rash or lesions  Plan High suspicion for C. difficile causing colitis given recent antibiotic use-await stool studies. In the meantime continue with Cipro/Flagyl. Suspect that if clinical improvement continues, she should  be able to go home tomorrow. Long discussion with daughter at bedside.  Rest as above  [facility]

## 2015-10-12 DIAGNOSIS — E876 Hypokalemia: Secondary | ICD-10-CM

## 2015-10-12 DIAGNOSIS — E785 Hyperlipidemia, unspecified: Secondary | ICD-10-CM

## 2015-10-12 DIAGNOSIS — S81802A Unspecified open wound, left lower leg, initial encounter: Secondary | ICD-10-CM

## 2015-10-12 DIAGNOSIS — I10 Essential (primary) hypertension: Secondary | ICD-10-CM

## 2015-10-12 MED ORDER — CIPROFLOXACIN HCL 500 MG PO TABS: 500.0000 mg | ORAL_TABLET | Freq: Two times a day (BID) | ORAL | Status: DC

## 2015-10-12 MED ORDER — METRONIDAZOLE 500 MG PO TABS: 500.0000 mg | ORAL_TABLET | Freq: Three times a day (TID) | ORAL | Status: DC

## 2015-10-12 NOTE — Progress Notes (Signed)
Nurse reviewed discharge instructions with pt.  Pt verbalized understanding of discharge instructions, follow up appointments and new medications.  No concerns at time of discharge.  Prescriptions given to pt.  Pt and family comfortable with wound care.

## 2015-10-12 NOTE — Discharge Summary (Signed)
PATIENT DETAILS Name: Brianna Bush Age: 82 y.o. Sex: female Date of Birth: 02/13/1924 MRN: [id number]. Admitting Physician: Vianne Bulls, MD [id number], CAMMIE, MD  Admit Date: 10/10/2015 Discharge date: 10/12/2015  Recommendations for Outpatient Follow-up:  1. Please repeat CBC/BMET at next visit 2. Consider referral to GI once has completed course of antibioticS  PRIMARY DISCHARGE DIAGNOSIS:  Principal Problem:   Colitis Active Problems:   Hypokalemia   Kidney disease   GERD (gastroesophageal reflux disease)   Leg wound, left   Hypertension   Hyperlipidemia   Endometrial carcinoma (HCC)   Colitis, acute      PAST MEDICAL HISTORY: Past Medical History  Diagnosis Date  . Hypertension   . Hyperlipidemia   . Gilbert disease   . IBS (irritable bowel syndrome)   . Osteopenia   . Endometrial carcinoma ([place])   . Diverticulosis   . Vitamin D deficiency   . GERD (gastroesophageal reflux disease)   . Acute sinusitis, unspecified     DISCHARGE MEDICATIONS: Current Discharge Medication List    START taking these medications   Details  ciprofloxacin (CIPRO) 500 MG tablet Take 1 tablet (500 mg total) by mouth 2 (two) times daily. Qty: 10 tablet, Refills: 0    metroNIDAZOLE (FLAGYL) 500 MG tablet Take 1 tablet (500 mg total) by mouth 3 (three) times daily. Qty: 15 tablet, Refills: 0      CONTINUE these medications which have NOT CHANGED   Details  amLODipine (NORVASC) 5 MG tablet Take 5 mg by mouth daily.    hydrochlorothiazide (MICROZIDE) 12.5 MG capsule Take 12.5 mg by mouth daily.    ibuprofen (ADVIL,MOTRIN) 200 MG tablet Take 200 mg by mouth every 6 (six) hours as needed.    raloxifene (EVISTA) 60 MG tablet Take 60 mg by mouth daily.    simvastatin (ZOCOR) 10 MG tablet Take 10 mg by mouth daily.    predniSONE (DELTASONE) 10 MG tablet Take 10 mg by mouth daily as needed (inflammation).         ALLERGIES:   Allergies  Allergen Reactions  .  Tenoretic [Atenolol-Chlorthalidone]     bradycardia    BRIEF HPI:  See H&P, Labs, Consult and Test reports for all details in brief, 82 y.o female with a PMH of endometrial cancer s/p hysterectomy, HTN, hyperlipidemia, Gilbert disease, diverticulosis, LLE wound that presented to ED with fever, abdominal pain, and watery diarrhea. Recently finished a course of antibiotics on 6/6. CT on 6/9 suggest pancolitis over diverticulitis  CONSULTATIONS:   None  PERTINENT RADIOLOGIC STUDIES: Ct Abdomen Pelvis Wo Contrast  10/11/2015  CLINICAL DATA:  82 year old female with left lower quadrant abdominal pain and diarrhea. EXAM: CT ABDOMEN AND PELVIS WITHOUT CONTRAST TECHNIQUE: Multidetector CT imaging of the abdomen and pelvis was performed following the standard protocol without IV contrast. COMPARISON:  None. FINDINGS: Evaluation of this exam is limited in the absence of intravenous contrast. The visualized lung bases are clear. No intra-abdominal free air or free fluid noted. There is slight irregularity of the hepatic contour. The liver is otherwise unremarkable on this noncontrast study. The gallbladder appears unremarkable. A 5 mm calcific focus in the porta hepaticus likely represents vascular calcification and less likely a stone within the cystic duct. There is no gallbladder distention or pericholecystic fluid. The pancreas, spleen, and adrenal glands appear unremarkable. Punctate nonobstructing left renal inferior pole calculi. No hydronephrosis. There is no hydronephrosis or nephrolithiasis the right. Subcentimeter bilateral renal cortical and parenchymal high attenuating lesions are  not well characterized but likely represent hemorrhagic/proteinaceous cysts. Ultrasound may provide better characterization. The visualized ureters and urinary bladder appear unremarkable. Hysterectomy. Small hiatal hernia. There is sigmoid diverticulosis. There is diffuse thickened appearance of the cecum and colonic wall as  well as thickened appearance of the rectosigmoid. There is more pronounced pericolonic haziness surrounding the sigmoid diverticula. Findings likely represent colitis versus less likely sigmoid diverticulitis. Correlation with clinical exam and stool cultures recommended. There is no evidence of bowel obstruction. The appendix is not visualized with certainty. No inflammatory changes identified in the right lower quadrant. Advanced aortoiliac atherosclerotic disease. The aorta is tortuous. No portal venous gas identified. Evaluation of the vasculature is limited on this noncontrast study. The abdominal wall soft tissues appear unremarkable. There is osteopenia with extensive degenerative changes of the spine. There is grade 1 L2-L3 retrolisthesis. IMPRESSION: Pancolitis versus less likely sigmoid diverticulitis. Correlation with clinical exam and stool cultures recommended. No bowel obstruction. Electronically Signed   By: Anner Crete M.D.   On: 10/11/2015 00:21     PERTINENT LAB RESULTS: CBC:  Recent Labs  10/10/15 2004 10/11/15 0210 10/11/15 0801  WBC 20.3*  --  13.3*  HGB 13.9  --  12.9  HCT 39.8  --  37.4  PLT PLATELET CLUMPS NOTED ON SMEAR, COUNT APPEARS ADEQUATE 148* 158   CMET CMP     Component Value Date/Time   NA 140 10/11/2015 0801   K 3.8 10/11/2015 0801   CL 112* 10/11/2015 0801   CO2 19* 10/11/2015 0801   GLUCOSE 87 10/11/2015 0801   BUN 26* 10/11/2015 0801   CREATININE 0.94 10/11/2015 0801   CALCIUM 8.2* 10/11/2015 0801   PROT 6.8 10/10/2015 2004   ALBUMIN 3.7 10/10/2015 2004   AST 20 10/10/2015 2004   ALT 15 10/10/2015 2004   ALKPHOS 61 10/10/2015 2004   BILITOT 2.1* 10/10/2015 2004   GFRNONAA 52* 10/11/2015 0801   GFRAA >60 10/11/2015 0801    GFR Estimated Creatinine Clearance: 36.9 mL/min (by C-G formula based on Cr of 0.94).  Recent Labs  10/10/15 2004  LIPASE 35   No results for input(s): CKTOTAL, CKMB, CKMBINDEX, TROPONINI in the last 72  hours. Invalid input(s): POCBNP No results for input(s): DDIMER in the last 72 hours. No results for input(s): HGBA1C in the last 72 hours. No results for input(s): CHOL, HDL, LDLCALC, TRIG, CHOLHDL, LDLDIRECT in the last 72 hours. No results for input(s): TSH, T4TOTAL, T3FREE, THYROIDAB in the last 72 hours.  Invalid input(s): FREET3 No results for input(s): VITAMINB12, FOLATE, FERRITIN, TIBC, IRON, RETICCTPCT in the last 72 hours. Coags: No results for input(s): INR in the last 72 hours.  Invalid input(s): PT Microbiology: Recent Results (from the past 240 hour(s))  Gastrointestinal Panel by PCR , Stool     Status: None   Collection Time: 10/11/15  9:45 AM  Result Value Ref Range Status   Campylobacter species NOT DETECTED NOT DETECTED Final   Plesimonas shigelloides NOT DETECTED NOT DETECTED Final   Salmonella species NOT DETECTED NOT DETECTED Final   Yersinia enterocolitica NOT DETECTED NOT DETECTED Final   Vibrio species NOT DETECTED NOT DETECTED Final   Vibrio cholerae NOT DETECTED NOT DETECTED Final   Enteroaggregative E coli (EAEC) NOT DETECTED NOT DETECTED Final   Enteropathogenic E coli (EPEC) NOT DETECTED NOT DETECTED Final   Enterotoxigenic E coli (ETEC) NOT DETECTED NOT DETECTED Final   Shiga like toxin producing E coli (STEC) NOT DETECTED NOT DETECTED Final  E. coli O157 NOT DETECTED NOT DETECTED Final   Shigella/Enteroinvasive E coli (EIEC) NOT DETECTED NOT DETECTED Final   Cryptosporidium NOT DETECTED NOT DETECTED Final   Cyclospora cayetanensis NOT DETECTED NOT DETECTED Final   Entamoeba histolytica NOT DETECTED NOT DETECTED Final   Giardia lamblia NOT DETECTED NOT DETECTED Final   Adenovirus F40/41 NOT DETECTED NOT DETECTED Final   Astrovirus NOT DETECTED NOT DETECTED Final   Norovirus GI/GII NOT DETECTED NOT DETECTED Final   Rotavirus A NOT DETECTED NOT DETECTED Final   Sapovirus (I, II, IV, and V) NOT DETECTED NOT DETECTED Final  C difficile quick scan w  PCR reflex     Status: None   Collection Time: 10/11/15  6:40 PM  Result Value Ref Range Status   C Diff antigen NEGATIVE NEGATIVE Final   C Diff toxin NEGATIVE NEGATIVE Final   C Diff interpretation Negative for toxigenic C. difficile  Final     BRIEF HOSPITAL COURSE:  Colitis: Likely infectious, given recent antibiotic use-C. difficile was suspected but C. difficile PCR was negative. GI pathogen panel was negative. Patient was placed on empiric ciprofloxacin and Flagyl on admission, she rapidly improved. By day of discharge, no further pain, diarrhea had resolved and she was having solid stools. Abdomen is very soft and nontender on exam. She was easily tolerating a regular diet. At this time, it is felt that the patient is stable to take oral antibiotics and be discharged home. She has been asked to follow-up with the PCP in 1 week   Hypokalemia: Secondary to GI loss. Repleted.  Acute Kidney Injury: likely mild prerenal azotemia. Resolved with IV fluid hydration.  Leukocytosis: secondary to Colitis, WBC 20,000 on admission. Resolving with antibiotics now WBC 13,300. Complete nontoxic appearing, her admitting symptoms have by and large resolved, stable. Repeat blood check in the next few weeks at PCPs office.  Hypertension: Continue home medication Norvasc and HCTZ.   Hyperlipidemia: Continue home medication of Zocor  Left leg wound: No signs of infection. Will monitor during stay. Follow up with PCP.  Hx of Endometrial cancer: Status-post hysterectomy. Continue home medication Evista  TODAY-DAY OF DISCHARGE:  Subjective:   Canda Slappey today has no headache,no chest abdominal pain,no new weakness tingling or numbness, feels much better wants to go home today.   Objective:   Blood pressure 149/64, pulse 60, temperature 98.1 F (36.7 C), temperature source Oral, resp. rate 18, height 5\' 2"  (1.575 m), weight 68.947 kg (152 lb), SpO2 97 %.  Intake/Output Summary (Last 24  hours) at 10/12/15 1020 Last data filed at 10/12/15 0918  Gross per 24 hour  Intake    120 ml  Output      0 ml  Net    120 ml   Filed Weights   10/11/15 0153  Weight: 68.947 kg (152 lb)    Exam Awake Alert, Oriented *3, No new F.N deficits, Normal affect [place].AT,PERRAL Supple Neck,No JVD, No cervical lymphadenopathy appriciated.  Symmetrical Chest wall movement, Good air movement bilaterally, CTAB RRR,No Gallops,Rubs or new Murmurs, No Parasternal Heave +ve B.Sounds, Abd Soft, Non tender, No organomegaly appriciated, No rebound -guarding or rigidity. No Cyanosis, Clubbing or edema, No new Rash or bruise  DISCHARGE CONDITION: Stable  DISPOSITION: Home  DISCHARGE INSTRUCTIONS:    Activity:  As tolerated  Get Medicines reviewed and adjusted: Please take all your medications with you for your next visit with your Primary MD  Please request your Primary MD to go over all hospital  tests and procedure/radiological results at the follow up, please ask your Primary MD to get all Hospital records sent to his/her office.  If you experience worsening of your admission symptoms, develop shortness of breath, life threatening emergency, suicidal or homicidal thoughts you must seek medical attention immediately by calling 911 or calling your MD immediately  if symptoms less severe.  You must read complete instructions/literature along with all the possible adverse reactions/side effects for all the Medicines you take and that have been prescribed to you. Take any new Medicines after you have completely understood and accpet all the possible adverse reactions/side effects.   Do not drive when taking Pain medications.   Do not take more than prescribed Pain, Sleep and Anxiety Medications  Special Instructions: If you have smoked or chewed Tobacco  in the last 2 yrs please stop smoking, stop any regular Alcohol  and or any Recreational drug use.  Wear Seat belts while driving.  Please  note  You were cared for by a hospitalist during your hospital stay. Once you are discharged, your primary care physician will handle any further medical issues. Please note that NO REFILLS for any discharge medications will be authorized once you are discharged, as it is imperative that you return to your primary care physician (or establish a relationship with a primary care physician if you do not have one) for your aftercare needs so that they can reassess your need for medications and monitor your lab values.   Diet recommendation: Heart Healthy diet  Discharge Instructions    Call MD for:  persistant nausea and vomiting    Complete by:  As directed      Call MD for:  severe uncontrolled pain    Complete by:  As directed      Call MD for:  temperature >100.4    Complete by:  As directed      Diet - low sodium heart healthy    Complete by:  As directed      Increase activity slowly    Complete by:  As directed            Follow-up Information    Follow up with FULP, CAMMIE, MD. Schedule an appointment as soon as possible for a visit in 1 week.   Specialty:  Family Medicine   Why:  Hospital follow up, Repeat electrolytes, Repeat Complete Blood Count   Contact information:   [id number] [address] [phone]      Total Time spent on discharge equals 25  minutes.  SignedOren Binet 10/12/2015 10:20 AM

## 2015-10-13 LAB — URINE CULTURE: SPECIAL REQUESTS: NORMAL

## 2015-10-16 DIAGNOSIS — H353221 Exudative age-related macular degeneration, left eye, with active choroidal neovascularization: Secondary | ICD-10-CM | POA: Diagnosis not present

## 2015-10-16 DIAGNOSIS — H43813 Vitreous degeneration, bilateral: Secondary | ICD-10-CM | POA: Diagnosis not present

## 2015-10-16 DIAGNOSIS — H353113 Nonexudative age-related macular degeneration, right eye, advanced atrophic without subfoveal involvement: Secondary | ICD-10-CM | POA: Diagnosis not present

## 2015-10-16 DIAGNOSIS — H35423 Microcystoid degeneration of retina, bilateral: Secondary | ICD-10-CM | POA: Diagnosis not present

## 2015-10-17 DIAGNOSIS — C44319 Basal cell carcinoma of skin of other parts of face: Secondary | ICD-10-CM | POA: Diagnosis not present

## 2015-10-18 DIAGNOSIS — K529 Noninfective gastroenteritis and colitis, unspecified: Secondary | ICD-10-CM | POA: Diagnosis not present

## 2015-10-18 DIAGNOSIS — T148 Other injury of unspecified body region: Secondary | ICD-10-CM | POA: Diagnosis not present

## 2015-10-28 ENCOUNTER — Encounter (HOSPITAL_BASED_OUTPATIENT_CLINIC_OR_DEPARTMENT_OTHER): Payer: Medicare HMO | Attending: Internal Medicine

## 2015-10-28 DIAGNOSIS — I10 Essential (primary) hypertension: Secondary | ICD-10-CM | POA: Diagnosis not present

## 2015-10-28 DIAGNOSIS — M109 Gout, unspecified: Secondary | ICD-10-CM | POA: Insufficient documentation

## 2015-10-28 DIAGNOSIS — L97221 Non-pressure chronic ulcer of left calf limited to breakdown of skin: Secondary | ICD-10-CM | POA: Insufficient documentation

## 2015-10-28 DIAGNOSIS — I87332 Chronic venous hypertension (idiopathic) with ulcer and inflammation of left lower extremity: Secondary | ICD-10-CM | POA: Insufficient documentation

## 2015-10-28 DIAGNOSIS — S81802A Unspecified open wound, left lower leg, initial encounter: Secondary | ICD-10-CM | POA: Diagnosis not present

## 2015-10-31 ENCOUNTER — Encounter (HOSPITAL_COMMUNITY): Payer: Self-pay | Admitting: Emergency Medicine

## 2015-10-31 ENCOUNTER — Ambulatory Visit (HOSPITAL_COMMUNITY)
Admission: EM | Admit: 2015-10-31 | Discharge: 2015-10-31 | Disposition: A | Discharge status: Home / Self Care | Payer: Medicare HMO | Attending: Emergency Medicine | Admitting: Emergency Medicine

## 2015-10-31 DIAGNOSIS — R197 Diarrhea, unspecified: Secondary | ICD-10-CM | POA: Diagnosis not present

## 2015-10-31 DIAGNOSIS — I87332 Chronic venous hypertension (idiopathic) with ulcer and inflammation of left lower extremity: Secondary | ICD-10-CM | POA: Diagnosis not present

## 2015-10-31 DIAGNOSIS — L97221 Non-pressure chronic ulcer of left calf limited to breakdown of skin: Secondary | ICD-10-CM | POA: Diagnosis not present

## 2015-10-31 DIAGNOSIS — I10 Essential (primary) hypertension: Secondary | ICD-10-CM | POA: Diagnosis not present

## 2015-10-31 DIAGNOSIS — M109 Gout, unspecified: Secondary | ICD-10-CM | POA: Diagnosis not present

## 2015-10-31 LAB — POCT I-STAT, CHEM 8
BUN: 32 mg/dL — AB (ref 6–20)
CALCIUM ION: 1.13 mmol/L (ref 1.12–1.23)
Chloride: 102 mmol/L (ref 101–111)
Creatinine, Ser: 1.2 mg/dL — ABNORMAL HIGH (ref 0.44–1.00)
GLUCOSE: 106 mg/dL — AB (ref 65–99)
HCT: 44 % (ref 36.0–46.0)
Hemoglobin: 15 g/dL (ref 12.0–15.0)
Potassium: 3.5 mmol/L (ref 3.5–5.1)
SODIUM: 142 mmol/L (ref 135–145)
TCO2: 30 mmol/L (ref 0–100)

## 2015-10-31 NOTE — ED Notes (Signed)
The patient presented to the [facility] with family with a complaint of diarrhea since 10/09/2015.

## 2015-10-31 NOTE — Discharge Instructions (Signed)
Chronic Diarrhea Diarrhea is frequent loose and watery bowel movements. It can cause you to feel weak and dehydrated. Dehydration can cause you to become tired and thirsty and to have a dry mouth, decreased urination, and dark yellow urine. Diarrhea is a sign of another problem, most often an infection that will not last long. In most cases, diarrhea lasts 2-3 days. Diarrhea that lasts longer than 4 weeks is called long-lasting (chronic) diarrhea. It is important to treat your diarrhea as directed by your health care provider to lessen or prevent future episodes of diarrhea.  CAUSES  There are many causes of chronic diarrhea. The following are some possible causes:   Gastrointestinal infections caused by viruses, bacteria, or parasites.   Food poisoning or food allergies.   Certain medicines, such as antibiotics, chemotherapy, and laxatives.   Artificial sweeteners and fructose.   Digestive disorders, such as celiac disease and inflammatory bowel diseases.   Irritable bowel syndrome.  Some disorders of the pancreas.  Disorders of the thyroid.  Reduced blood flow to the intestines.  Cancer. Sometimes the cause of chronic diarrhea is unknown. RISK FACTORS  Having a severely weakened immune system, such as from HIV or AIDS.   Taking certain types of cancer-fighting drugs (such as with chemotherapy) or other medicines.   Having had a recent organ transplant.   Having a portion of the stomach or small bowel removed.   Traveling to countries where food and water supplies are often contaminated.  SYMPTOMS  In addition to frequent, loose stools, diarrhea may cause:   Cramping.   Abdominal pain.   Nausea.   Fever.  Fatigue.  Urgent need to use the bathroom.  Loss of bowel control. DIAGNOSIS  Your health care provider must take a careful history and perform a physical exam. Tests given are based on your symptoms and history. Tests may include:   Blood or  stool tests. Three or more stool samples may be examined. Stool cultures may be used to test for bacteria or parasites.   X-rays.   A procedure in which a thin tube is inserted into the mouth or rectum (endoscopy). This allows the health care provider to look inside the intestine.  TREATMENT   Treatment is aimed at correcting the cause of the diarrhea when possible.  Diarrhea caused by an infection can often be treated with antibiotic medicines.  Diarrhea not caused by an infection may require you to take long-term medicine or have surgery. Specific treatment should be discussed with your health care provider.  If the cause cannot be determined, treatment aims to relieve symptoms and prevent dehydration. Serious health problems can occur if you do not maintain proper fluid levels. Treatment may include:  Taking an oral rehydration solution (ORS).  Not drinking beverages that contain caffeine (such as tea, coffee, and soft drinks).  Not drinking alcohol.  Maintaining well-balanced nutrition to help you recover faster. HOME CARE INSTRUCTIONS   Drink enough fluids to keep urine clear or pale yellow. Drink 1 cup (8 oz) of fluid for each diarrhea episode. Avoid fluids that contain simple sugars, fruit juices, whole milk products, and sodas. Hydrate with an ORS. You may purchase the ORS or prepare it at home by mixing the following ingredients together:   - tsp (1.7-3  mL) table salt.   tsp (3  mL) baking soda.   tsp (1.7 mL) salt substitute containing potassium chloride.  1 tbsp (20 mL) sugar.  4.2 c (1 L) of water.     Certain foods and beverages may increase the speed at which food moves through the gastrointestinal (GI) tract. These foods and beverages should be avoided. They include:  Caffeinated and alcoholic beverages.  High-fiber foods, such as raw fruits and vegetables, nuts, seeds, and whole grain breads and cereals.  Foods and beverages sweetened with sugar  alcohols, such as xylitol, sorbitol, and mannitol.   Some foods may be well tolerated and may help thicken stool. These include:  Starchy foods, such as rice, toast, pasta, low-sugar cereal, oatmeal, grits, baked potatoes, crackers, and bagels.  Bananas.  Applesauce.  Add probiotic-rich foods to help increase healthy bacteria in the GI tract. These include yogurt and fermented milk products.  Wash your hands well after each diarrhea episode.  Only take over-the-counter or prescription medicines as directed by your health care provider.  Take a warm bath to relieve any burning or pain from frequent diarrhea episodes. SEEK MEDICAL CARE IF:   You are not urinating as often.  Your urine is a dark color.  You become very tired or dizzy.  You have severe pain in the abdomen or rectum.  Your have blood or pus in your stools.  Your stools look black and tarry. SEEK IMMEDIATE MEDICAL CARE IF:   You are unable to keep fluids down.  You have persistent vomiting.  You have blood in your stool.  Your stools are black and tarry.  You do not urinate in 6-8 hours, or there is only a small amount of very dark urine.  You have abdominal pain that increases or localizes.  You have weakness, dizziness, confusion, or lightheadedness.  You have a severe headache.  Your diarrhea gets worse or does not get better.  You have a fever or persistent symptoms for more than 2-3 days.  You have a fever and your symptoms suddenly get worse. MAKE SURE YOU:   Understand these instructions.  Will watch your condition.  Will get help right away if you are not doing well or get worse.   This information is not intended to replace advice given to you by your health care provider. Make sure you discuss any questions you have with your health care provider.   Document Released: 07/11/2003 Document Revised: 04/25/2013 Document Reviewed: 10/13/2012 Elsevier Interactive Patient Education 2016  Elsevier [employer]  

## 2015-10-31 NOTE — ED Provider Notes (Signed)
CSN: [id number]     Arrival date & time 10/31/15  1336 History   First MD Initiated Contact with Patient 10/31/15 1446     Chief Complaint  Patient presents with  . Diarrhea   (Consider location/radiation/quality/duration/timing/severity/associated sxs/prior Treatment) HPI History obtained from patient: Location:  Abdomen Context/Duration: Onset of diarrhea since taking antibiotics (ciprofloxacin and metronidazole)  Severity:0   Quality: Timing:    Twice daily usually in the morning and at night       Home Treatment: pro biotics Associated symptoms:  PCP would like stool cultures and I-stat done for Electrolytes Family History: Diabetes    Past Medical History  Diagnosis Date  . Hypertension   . Hyperlipidemia   . Gilbert disease   . IBS (irritable bowel syndrome)   . Osteopenia   . Endometrial carcinoma ([place])   . Diverticulosis   . Vitamin D deficiency   . GERD (gastroesophageal reflux disease)   . Acute sinusitis, unspecified    History reviewed. No pertinent past surgical history. History reviewed. No pertinent family history. Social History  Substance Use Topics  . Smoking status: Unknown If Ever Smoked  . Smokeless tobacco: None  . Alcohol Use: None   OB History    No data available     Review of Systems  Denies: HEADACHE, NAUSEA, ABDOMINAL PAIN, CHEST PAIN, CONGESTION, DYSURIA, SHORTNESS OF BREATH  Allergies  Tenoretic  Home Medications   Prior to Admission medications   Medication Sig Start Date End Date Taking? Authorizing Provider  amLODipine (NORVASC) 5 MG tablet Take 5 mg by mouth daily.   Yes Historical Provider, MD  hydrochlorothiazide (MICROZIDE) 12.5 MG capsule Take 12.5 mg by mouth daily.   Yes Historical Provider, MD  metroNIDAZOLE (FLAGYL) 500 MG tablet Take 1 tablet (500 mg total) by mouth 3 (three) times daily. 10/12/15  Yes Shanker Kristeen Mans, MD  raloxifene (EVISTA) 60 MG tablet Take 60 mg by mouth daily.   Yes Historical Provider, MD   simvastatin (ZOCOR) 10 MG tablet Take 10 mg by mouth daily.   Yes Historical Provider, MD  ciprofloxacin (CIPRO) 500 MG tablet Take 1 tablet (500 mg total) by mouth 2 (two) times daily. 10/12/15   Shanker Kristeen Mans, MD  ibuprofen (ADVIL,MOTRIN) 200 MG tablet Take 200 mg by mouth every 6 (six) hours as needed.    Historical Provider, MD  predniSONE (DELTASONE) 10 MG tablet Take 10 mg by mouth daily as needed (inflammation).     Historical Provider, MD   Meds Ordered and Administered this Visit  Medications - No data to display  BP 110/70 mmHg  Pulse 70  Temp(Src) 98.8 F (37.1 C) (Oral)  Resp 16  SpO2 97% No data found.   Physical Exam NURSES NOTES AND VITAL SIGNS REVIEWED. CONSTITUTIONAL: Well developed, well nourished, no acute distress HEENT: normocephalic, atraumatic EYES: Conjunctiva normal NECK:normal ROM, supple, no adenopathy PULMONARY:No respiratory distress, normal effort ABDOMINAL: Soft, ND, NT BS+, No CVAT MUSCULOSKELETAL: Normal ROM of all extremities,  SKIN: warm and dry without rash PSYCHIATRIC: Mood and affect, behavior are normal  ED Course  Procedures (including critical care time)  Labs Review Labs Reviewed  POCT I-STAT, CHEM 8 - Abnormal; Notable for the following:    BUN 32 (*)    Creatinine, Ser 1.20 (*)    Glucose, Bld 106 (*)    All other components within normal limits  GASTROINTESTINAL PANEL BY PCR, STOOL (REPLACES STOOL CULTURE)   CULTURE PENDING, BLOOD WORK DISCUSSED WITH PATIENT.  Imaging Review No results found.   Visual Acuity Review  Right Eye Distance:   Left Eye Distance:   Bilateral Distance:    Right Eye Near:   Left Eye Near:    Bilateral Near:      Total Visit Time: 40 MINUTES "GREATER THAN 50% WAS SPENT IN COUNSELING AND COORDINATION OF CARE WITH THE PATIENT" DISCUSSION OF DIAGNOSTIC WORK UP, DISCUSSION WITH PCP, RESULTS OF TESTS, AND PLAN. Advice to hold probiotics. Return to normal diet, await stool cultures before  further medication prescriptions if positive will speak with primary care provider on Monday to arrange for treatment. I have advised patient and her daughter that I will call on Monday with culture results.   MDM   1. Diarrhea, unspecified type     Patient is reassured that there are no issues that require transfer to higher level of care at this time or additional tests. Patient is advised to continue home symptomatic treatment. Patient is advised that if there are new or worsening symptoms to attend the emergency department, contact primary care provider, or return to UC. Instructions of care provided discharged home in stable condition.    THIS NOTE WAS GENERATED USING A VOICE RECOGNITION SOFTWARE PROGRAM. ALL REASONABLE EFFORTS  WERE MADE TO PROOFREAD THIS DOCUMENT FOR ACCURACY.  I have verbally reviewed the discharge instructions with the patient. A printed AVS was given to the patient.  All questions were answered prior to discharge.      Konrad Felix, PA 10/31/15 2029

## 2015-11-01 LAB — MISC LABCORP TEST (SEND OUT): LABCORP TEST CODE: 183480

## 2015-11-04 ENCOUNTER — Encounter (HOSPITAL_BASED_OUTPATIENT_CLINIC_OR_DEPARTMENT_OTHER): Payer: Medicare HMO | Attending: Internal Medicine

## 2015-11-04 DIAGNOSIS — L97821 Non-pressure chronic ulcer of other part of left lower leg limited to breakdown of skin: Secondary | ICD-10-CM | POA: Diagnosis not present

## 2015-11-04 DIAGNOSIS — I87332 Chronic venous hypertension (idiopathic) with ulcer and inflammation of left lower extremity: Secondary | ICD-10-CM | POA: Insufficient documentation

## 2015-11-04 DIAGNOSIS — S81802A Unspecified open wound, left lower leg, initial encounter: Secondary | ICD-10-CM | POA: Diagnosis not present

## 2015-11-04 DIAGNOSIS — I10 Essential (primary) hypertension: Secondary | ICD-10-CM | POA: Insufficient documentation

## 2015-11-04 DIAGNOSIS — M1990 Unspecified osteoarthritis, unspecified site: Secondary | ICD-10-CM | POA: Diagnosis not present

## 2015-11-11 DIAGNOSIS — L97821 Non-pressure chronic ulcer of other part of left lower leg limited to breakdown of skin: Secondary | ICD-10-CM | POA: Diagnosis not present

## 2015-11-11 DIAGNOSIS — S81802A Unspecified open wound, left lower leg, initial encounter: Secondary | ICD-10-CM | POA: Diagnosis not present

## 2015-11-11 DIAGNOSIS — I87332 Chronic venous hypertension (idiopathic) with ulcer and inflammation of left lower extremity: Secondary | ICD-10-CM | POA: Diagnosis not present

## 2015-11-11 DIAGNOSIS — M1990 Unspecified osteoarthritis, unspecified site: Secondary | ICD-10-CM | POA: Diagnosis not present

## 2015-11-11 DIAGNOSIS — I10 Essential (primary) hypertension: Secondary | ICD-10-CM | POA: Diagnosis not present

## 2015-11-18 DIAGNOSIS — H353221 Exudative age-related macular degeneration, left eye, with active choroidal neovascularization: Secondary | ICD-10-CM | POA: Diagnosis not present

## 2015-11-18 DIAGNOSIS — I10 Essential (primary) hypertension: Secondary | ICD-10-CM | POA: Diagnosis not present

## 2015-11-18 DIAGNOSIS — L97821 Non-pressure chronic ulcer of other part of left lower leg limited to breakdown of skin: Secondary | ICD-10-CM | POA: Diagnosis not present

## 2015-11-18 DIAGNOSIS — S81802A Unspecified open wound, left lower leg, initial encounter: Secondary | ICD-10-CM | POA: Diagnosis not present

## 2015-11-18 DIAGNOSIS — I87332 Chronic venous hypertension (idiopathic) with ulcer and inflammation of left lower extremity: Secondary | ICD-10-CM | POA: Diagnosis not present

## 2015-11-18 DIAGNOSIS — H43813 Vitreous degeneration, bilateral: Secondary | ICD-10-CM | POA: Diagnosis not present

## 2015-11-18 DIAGNOSIS — M1990 Unspecified osteoarthritis, unspecified site: Secondary | ICD-10-CM | POA: Diagnosis not present

## 2015-11-18 DIAGNOSIS — H35423 Microcystoid degeneration of retina, bilateral: Secondary | ICD-10-CM | POA: Diagnosis not present

## 2015-11-25 DIAGNOSIS — I87332 Chronic venous hypertension (idiopathic) with ulcer and inflammation of left lower extremity: Secondary | ICD-10-CM | POA: Diagnosis not present

## 2015-11-25 DIAGNOSIS — L97821 Non-pressure chronic ulcer of other part of left lower leg limited to breakdown of skin: Secondary | ICD-10-CM | POA: Diagnosis not present

## 2015-11-25 DIAGNOSIS — M1990 Unspecified osteoarthritis, unspecified site: Secondary | ICD-10-CM | POA: Diagnosis not present

## 2015-11-25 DIAGNOSIS — S81802A Unspecified open wound, left lower leg, initial encounter: Secondary | ICD-10-CM | POA: Diagnosis not present

## 2015-11-25 DIAGNOSIS — I10 Essential (primary) hypertension: Secondary | ICD-10-CM | POA: Diagnosis not present

## 2015-12-02 DIAGNOSIS — I87332 Chronic venous hypertension (idiopathic) with ulcer and inflammation of left lower extremity: Secondary | ICD-10-CM | POA: Diagnosis not present

## 2015-12-02 DIAGNOSIS — S81802A Unspecified open wound, left lower leg, initial encounter: Secondary | ICD-10-CM | POA: Diagnosis not present

## 2015-12-02 DIAGNOSIS — L97821 Non-pressure chronic ulcer of other part of left lower leg limited to breakdown of skin: Secondary | ICD-10-CM | POA: Diagnosis not present

## 2015-12-02 DIAGNOSIS — I10 Essential (primary) hypertension: Secondary | ICD-10-CM | POA: Diagnosis not present

## 2015-12-02 DIAGNOSIS — M1990 Unspecified osteoarthritis, unspecified site: Secondary | ICD-10-CM | POA: Diagnosis not present

## 2015-12-09 ENCOUNTER — Encounter (HOSPITAL_BASED_OUTPATIENT_CLINIC_OR_DEPARTMENT_OTHER): Payer: Medicare HMO | Attending: Internal Medicine

## 2015-12-09 DIAGNOSIS — Z872 Personal history of diseases of the skin and subcutaneous tissue: Secondary | ICD-10-CM | POA: Insufficient documentation

## 2015-12-09 DIAGNOSIS — Z09 Encounter for follow-up examination after completed treatment for conditions other than malignant neoplasm: Secondary | ICD-10-CM | POA: Diagnosis not present

## 2015-12-09 DIAGNOSIS — I10 Essential (primary) hypertension: Secondary | ICD-10-CM | POA: Insufficient documentation

## 2015-12-09 DIAGNOSIS — M109 Gout, unspecified: Secondary | ICD-10-CM | POA: Diagnosis not present

## 2015-12-09 DIAGNOSIS — S81802D Unspecified open wound, left lower leg, subsequent encounter: Secondary | ICD-10-CM | POA: Diagnosis not present

## 2015-12-27 DIAGNOSIS — Z79899 Other long term (current) drug therapy: Secondary | ICD-10-CM | POA: Diagnosis not present

## 2015-12-27 DIAGNOSIS — J385 Laryngeal spasm: Secondary | ICD-10-CM | POA: Diagnosis not present

## 2015-12-31 DIAGNOSIS — H35423 Microcystoid degeneration of retina, bilateral: Secondary | ICD-10-CM | POA: Diagnosis not present

## 2015-12-31 DIAGNOSIS — H353113 Nonexudative age-related macular degeneration, right eye, advanced atrophic without subfoveal involvement: Secondary | ICD-10-CM | POA: Diagnosis not present

## 2015-12-31 DIAGNOSIS — H43813 Vitreous degeneration, bilateral: Secondary | ICD-10-CM | POA: Diagnosis not present

## 2015-12-31 DIAGNOSIS — H353221 Exudative age-related macular degeneration, left eye, with active choroidal neovascularization: Secondary | ICD-10-CM | POA: Diagnosis not present

## 2016-02-04 DIAGNOSIS — H353221 Exudative age-related macular degeneration, left eye, with active choroidal neovascularization: Secondary | ICD-10-CM | POA: Diagnosis not present

## 2016-02-04 DIAGNOSIS — H43813 Vitreous degeneration, bilateral: Secondary | ICD-10-CM | POA: Diagnosis not present

## 2016-02-04 DIAGNOSIS — H35423 Microcystoid degeneration of retina, bilateral: Secondary | ICD-10-CM | POA: Diagnosis not present

## 2016-02-04 DIAGNOSIS — H353113 Nonexudative age-related macular degeneration, right eye, advanced atrophic without subfoveal involvement: Secondary | ICD-10-CM | POA: Diagnosis not present

## 2016-02-25 DIAGNOSIS — R490 Dysphonia: Secondary | ICD-10-CM | POA: Diagnosis not present

## 2016-02-25 DIAGNOSIS — J385 Laryngeal spasm: Secondary | ICD-10-CM | POA: Diagnosis not present

## 2016-02-25 DIAGNOSIS — Z974 Presence of external hearing-aid: Secondary | ICD-10-CM | POA: Diagnosis not present

## 2016-02-25 DIAGNOSIS — J383 Other diseases of vocal cords: Secondary | ICD-10-CM | POA: Diagnosis not present

## 2016-02-25 DIAGNOSIS — R69 Illness, unspecified: Secondary | ICD-10-CM | POA: Diagnosis not present

## 2016-02-25 DIAGNOSIS — J343 Hypertrophy of nasal turbinates: Secondary | ICD-10-CM | POA: Diagnosis not present

## 2016-03-10 DIAGNOSIS — H353113 Nonexudative age-related macular degeneration, right eye, advanced atrophic without subfoveal involvement: Secondary | ICD-10-CM | POA: Diagnosis not present

## 2016-03-10 DIAGNOSIS — H353221 Exudative age-related macular degeneration, left eye, with active choroidal neovascularization: Secondary | ICD-10-CM | POA: Diagnosis not present

## 2016-03-10 DIAGNOSIS — H43813 Vitreous degeneration, bilateral: Secondary | ICD-10-CM | POA: Diagnosis not present

## 2016-03-10 DIAGNOSIS — H35423 Microcystoid degeneration of retina, bilateral: Secondary | ICD-10-CM | POA: Diagnosis not present

## 2016-04-21 DIAGNOSIS — J383 Other diseases of vocal cords: Secondary | ICD-10-CM | POA: Diagnosis not present

## 2016-04-21 DIAGNOSIS — Z23 Encounter for immunization: Secondary | ICD-10-CM | POA: Diagnosis not present

## 2016-04-21 DIAGNOSIS — E784 Other hyperlipidemia: Secondary | ICD-10-CM | POA: Diagnosis not present

## 2016-04-21 DIAGNOSIS — H353113 Nonexudative age-related macular degeneration, right eye, advanced atrophic without subfoveal involvement: Secondary | ICD-10-CM | POA: Diagnosis not present

## 2016-04-21 DIAGNOSIS — I10 Essential (primary) hypertension: Secondary | ICD-10-CM | POA: Diagnosis not present

## 2016-04-21 DIAGNOSIS — M109 Gout, unspecified: Secondary | ICD-10-CM | POA: Diagnosis not present

## 2016-04-21 DIAGNOSIS — M8580 Other specified disorders of bone density and structure, unspecified site: Secondary | ICD-10-CM | POA: Diagnosis not present

## 2016-04-21 DIAGNOSIS — Z79899 Other long term (current) drug therapy: Secondary | ICD-10-CM | POA: Diagnosis not present

## 2016-04-21 DIAGNOSIS — H43813 Vitreous degeneration, bilateral: Secondary | ICD-10-CM | POA: Diagnosis not present

## 2016-04-21 DIAGNOSIS — H353221 Exudative age-related macular degeneration, left eye, with active choroidal neovascularization: Secondary | ICD-10-CM | POA: Diagnosis not present

## 2016-04-21 DIAGNOSIS — R946 Abnormal results of thyroid function studies: Secondary | ICD-10-CM | POA: Diagnosis not present

## 2016-04-21 DIAGNOSIS — M5136 Other intervertebral disc degeneration, lumbar region: Secondary | ICD-10-CM | POA: Diagnosis not present

## 2016-04-21 DIAGNOSIS — H35423 Microcystoid degeneration of retina, bilateral: Secondary | ICD-10-CM | POA: Diagnosis not present

## 2016-04-21 DIAGNOSIS — E559 Vitamin D deficiency, unspecified: Secondary | ICD-10-CM | POA: Diagnosis not present

## 2016-05-05 DIAGNOSIS — L03116 Cellulitis of left lower limb: Secondary | ICD-10-CM | POA: Diagnosis not present

## 2016-05-27 DIAGNOSIS — R05 Cough: Secondary | ICD-10-CM | POA: Diagnosis not present

## 2016-05-27 DIAGNOSIS — J029 Acute pharyngitis, unspecified: Secondary | ICD-10-CM | POA: Diagnosis not present

## 2016-06-04 DIAGNOSIS — M8588 Other specified disorders of bone density and structure, other site: Secondary | ICD-10-CM | POA: Diagnosis not present

## 2016-06-04 DIAGNOSIS — E2839 Other primary ovarian failure: Secondary | ICD-10-CM | POA: Diagnosis not present

## 2016-06-09 DIAGNOSIS — H353221 Exudative age-related macular degeneration, left eye, with active choroidal neovascularization: Secondary | ICD-10-CM | POA: Diagnosis not present

## 2016-06-09 DIAGNOSIS — H43813 Vitreous degeneration, bilateral: Secondary | ICD-10-CM | POA: Diagnosis not present

## 2016-06-09 DIAGNOSIS — H35423 Microcystoid degeneration of retina, bilateral: Secondary | ICD-10-CM | POA: Diagnosis not present

## 2016-06-09 DIAGNOSIS — H353113 Nonexudative age-related macular degeneration, right eye, advanced atrophic without subfoveal involvement: Secondary | ICD-10-CM | POA: Diagnosis not present

## 2016-07-22 DIAGNOSIS — M19011 Primary osteoarthritis, right shoulder: Secondary | ICD-10-CM | POA: Diagnosis not present

## 2016-07-23 DIAGNOSIS — H524 Presbyopia: Secondary | ICD-10-CM | POA: Diagnosis not present

## 2016-07-23 DIAGNOSIS — H5213 Myopia, bilateral: Secondary | ICD-10-CM | POA: Diagnosis not present

## 2016-07-23 DIAGNOSIS — H52203 Unspecified astigmatism, bilateral: Secondary | ICD-10-CM | POA: Diagnosis not present

## 2016-07-28 DIAGNOSIS — H353113 Nonexudative age-related macular degeneration, right eye, advanced atrophic without subfoveal involvement: Secondary | ICD-10-CM | POA: Diagnosis not present

## 2016-07-28 DIAGNOSIS — H35423 Microcystoid degeneration of retina, bilateral: Secondary | ICD-10-CM | POA: Diagnosis not present

## 2016-07-28 DIAGNOSIS — H353221 Exudative age-related macular degeneration, left eye, with active choroidal neovascularization: Secondary | ICD-10-CM | POA: Diagnosis not present

## 2016-07-28 DIAGNOSIS — H43813 Vitreous degeneration, bilateral: Secondary | ICD-10-CM | POA: Diagnosis not present

## 2016-09-15 DIAGNOSIS — H353221 Exudative age-related macular degeneration, left eye, with active choroidal neovascularization: Secondary | ICD-10-CM | POA: Diagnosis not present

## 2016-09-15 DIAGNOSIS — H43813 Vitreous degeneration, bilateral: Secondary | ICD-10-CM | POA: Diagnosis not present

## 2016-09-15 DIAGNOSIS — H35423 Microcystoid degeneration of retina, bilateral: Secondary | ICD-10-CM | POA: Diagnosis not present

## 2016-09-15 DIAGNOSIS — H353113 Nonexudative age-related macular degeneration, right eye, advanced atrophic without subfoveal involvement: Secondary | ICD-10-CM | POA: Diagnosis not present

## 2016-10-07 DIAGNOSIS — I10 Essential (primary) hypertension: Secondary | ICD-10-CM | POA: Diagnosis not present

## 2016-10-07 DIAGNOSIS — E7800 Pure hypercholesterolemia, unspecified: Secondary | ICD-10-CM | POA: Diagnosis not present

## 2016-10-07 DIAGNOSIS — Z9181 History of falling: Secondary | ICD-10-CM | POA: Diagnosis not present

## 2016-11-05 DIAGNOSIS — H353221 Exudative age-related macular degeneration, left eye, with active choroidal neovascularization: Secondary | ICD-10-CM | POA: Diagnosis not present

## 2016-11-05 DIAGNOSIS — H43813 Vitreous degeneration, bilateral: Secondary | ICD-10-CM | POA: Diagnosis not present

## 2016-11-05 DIAGNOSIS — H35423 Microcystoid degeneration of retina, bilateral: Secondary | ICD-10-CM | POA: Diagnosis not present

## 2016-11-05 DIAGNOSIS — H353113 Nonexudative age-related macular degeneration, right eye, advanced atrophic without subfoveal involvement: Secondary | ICD-10-CM | POA: Diagnosis not present

## 2016-11-08 IMAGING — CT CT ABD-PELV W/O CM
2 of 4 series · 16 of 46 positions shown, 18 images · non-contrast
Comparison: None.

CLINICAL DATA: 89-year-old female with left lower quadrant
abdominal pain and diarrhea.

EXAM:
CT ABDOMEN AND PELVIS WITHOUT CONTRAST
TECHNIQUE: Multidetector CT imaging of the abdomen and pelvis was performed
following the standard protocol without IV contrast.

[Series 2: abd/pel w/o · axial · non-contrast · 0.74mm/px · z∈[+1059,+1399]mm · 13 of 78 slices shown, 15 images]
[im 5/78  soft-tissue]
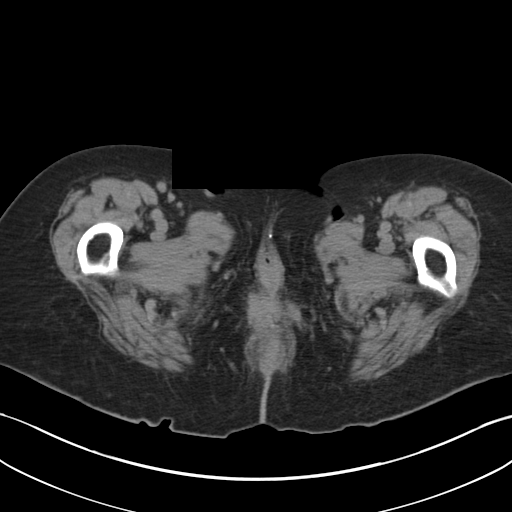
[im 5/78  bone]
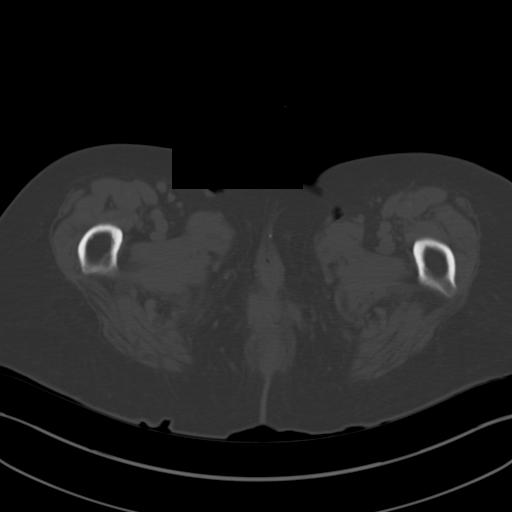
[im 13/78  soft-tissue]
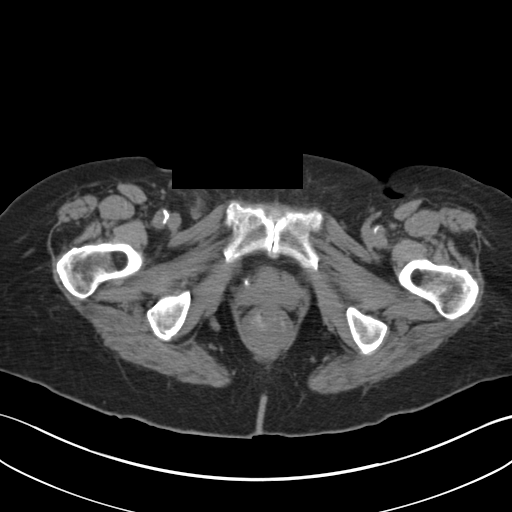
[im 17/78  soft-tissue]
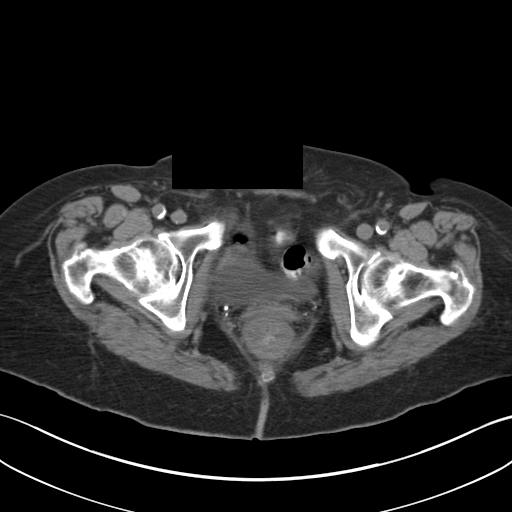
[im 21/78  soft-tissue]
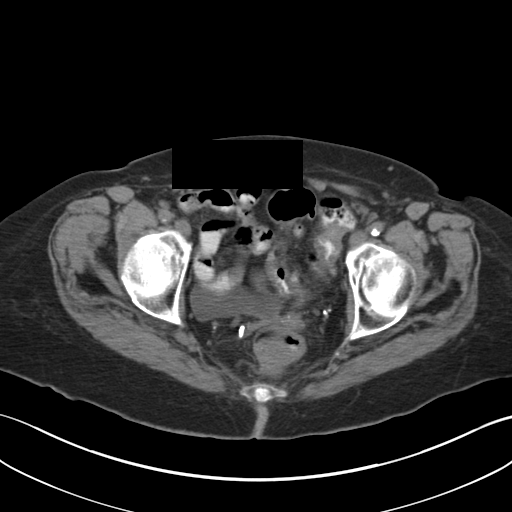
[im 29/78  soft-tissue]
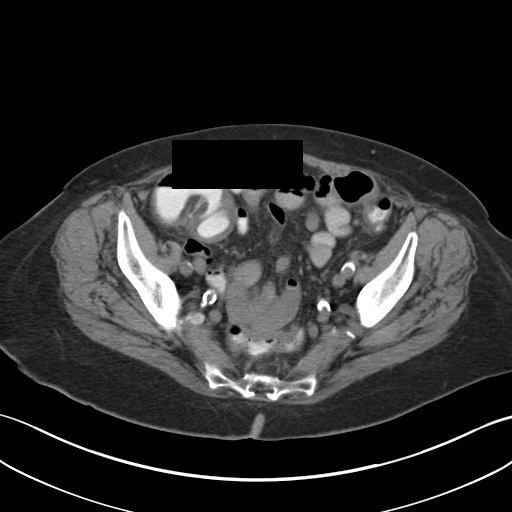
[im 33/78  soft-tissue]
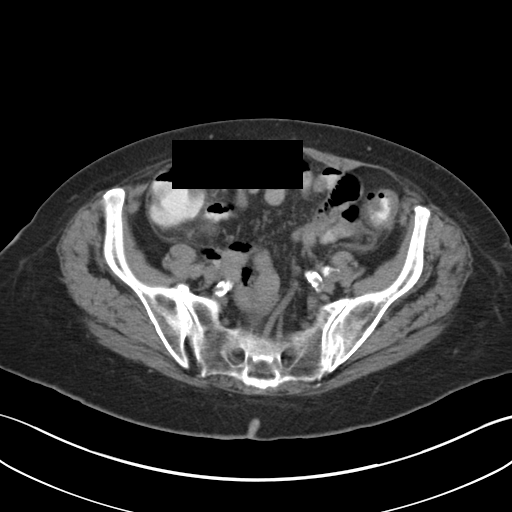
[im 41/78  soft-tissue]
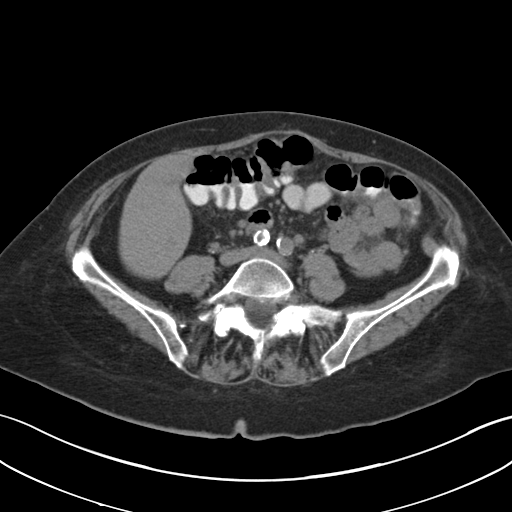
[im 45/78  soft-tissue]
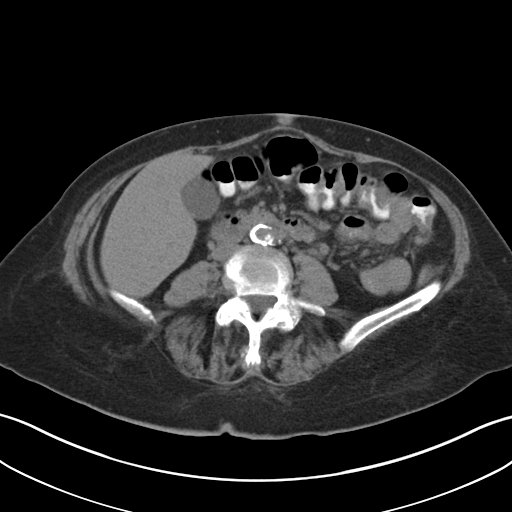
[im 49/78  soft-tissue]
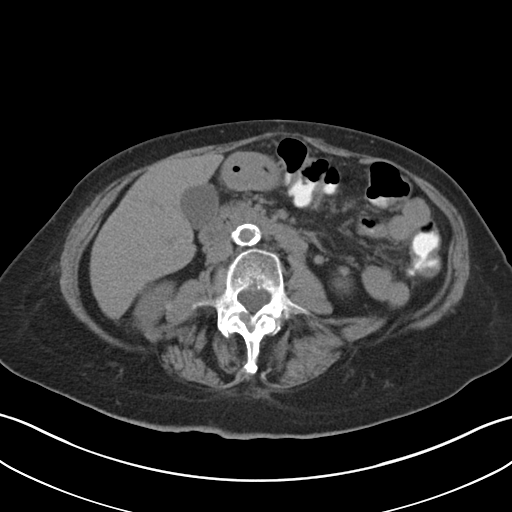
[im 49/78  bone]
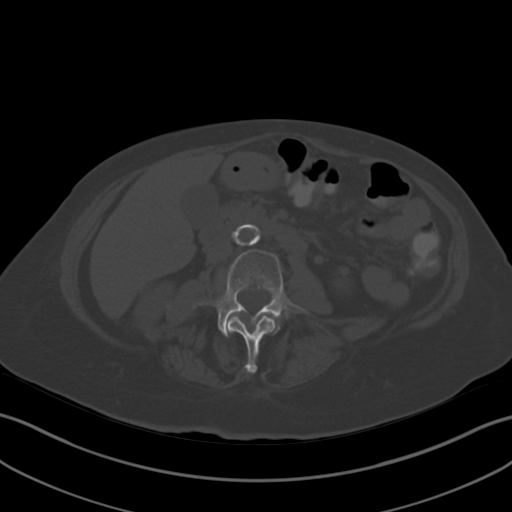
[im 57/78  soft-tissue]
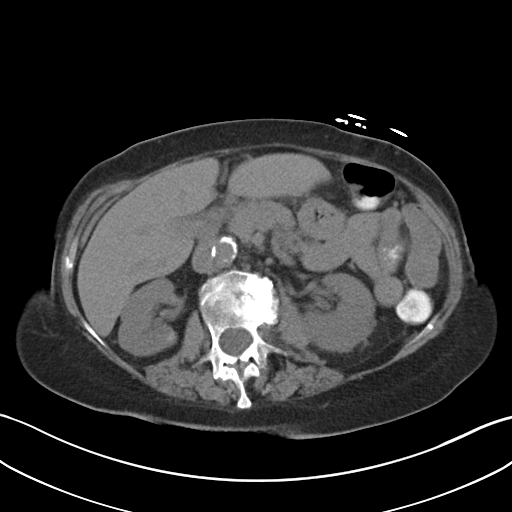
[im 61/78  soft-tissue]
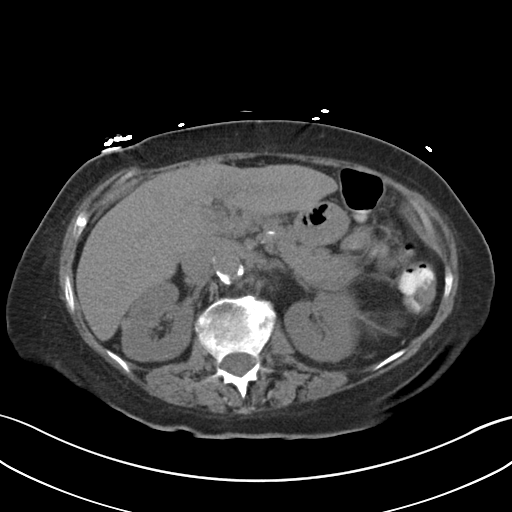
[im 65/78  soft-tissue]
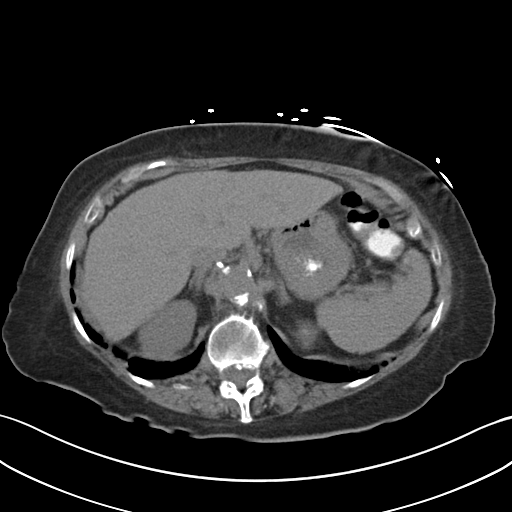
[im 73/78  soft-tissue]
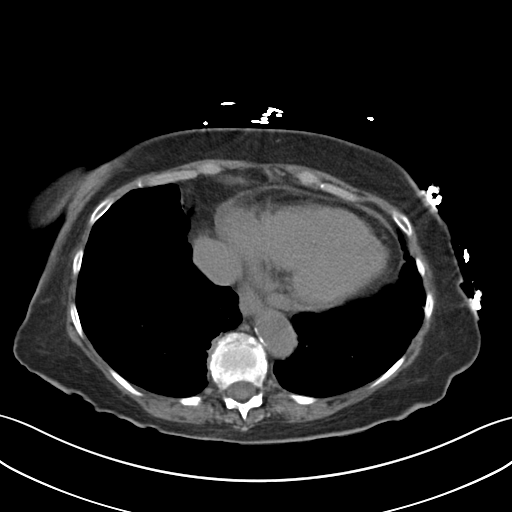

[Series 5: coronal · coronal · 0.78mm/px · 3 of 121 slices shown]
[im 41/121  soft-tissue]
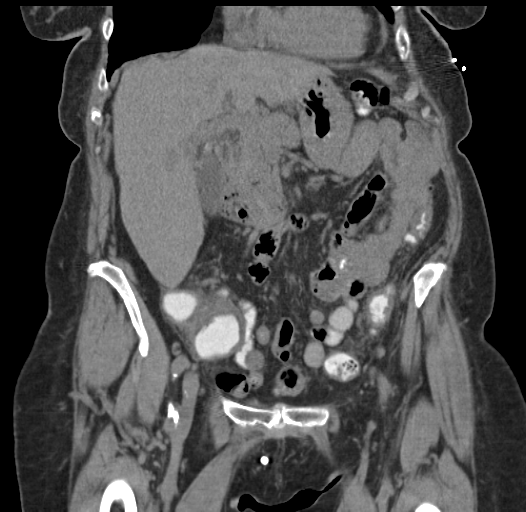
[im 54/121  soft-tissue]
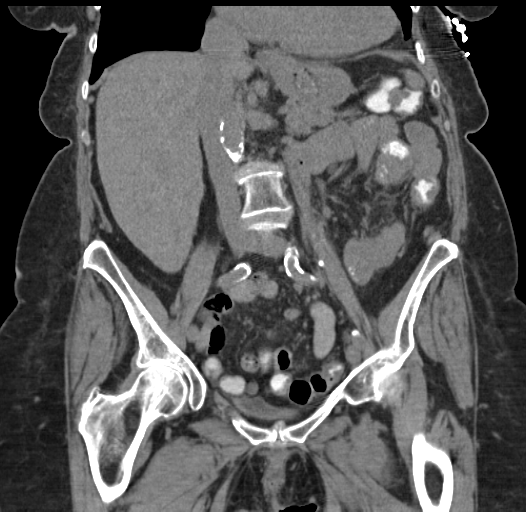
[im 67/121  soft-tissue]
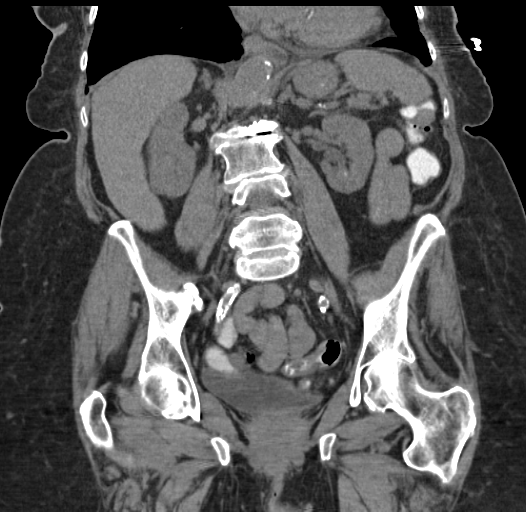

[16 of 46 positions shown; findings below may reference images not displayed]

FINDINGS: Evaluation of this exam is limited in the absence of intravenous
contrast.

The visualized lung bases are clear. No intra-abdominal free air or
free fluid noted.

There is slight irregularity of the hepatic contour. The liver is
otherwise unremarkable on this noncontrast study. The gallbladder
appears unremarkable. A 5 mm calcific focus in the porta hepaticus
likely represents vascular calcification and less likely a stone
within the cystic duct. There is no gallbladder distention or
pericholecystic fluid. The pancreas, spleen, and adrenal glands
appear unremarkable.

Punctate nonobstructing left renal inferior pole calculi. No
hydronephrosis. There is no hydronephrosis or nephrolithiasis the
right. Subcentimeter bilateral renal cortical and parenchymal high
attenuating lesions are not well characterized but likely represent
hemorrhagic/proteinaceous cysts. Ultrasound may provide better
characterization. The visualized ureters and urinary bladder appear
unremarkable. Hysterectomy.

Small hiatal hernia. There is sigmoid diverticulosis. There is
diffuse thickened appearance of the cecum and colonic wall as well
as thickened appearance of the rectosigmoid. There is more
pronounced pericolonic haziness surrounding the sigmoid diverticula.
Findings likely represent colitis versus less likely sigmoid
diverticulitis. Correlation with clinical exam and stool cultures
recommended. There is no evidence of bowel obstruction. The appendix
is not visualized with certainty. No inflammatory changes identified
in the right lower quadrant.

Advanced aortoiliac atherosclerotic disease. The aorta is tortuous.
No portal venous gas identified. Evaluation of the vasculature is
limited on this noncontrast study. The abdominal wall soft tissues
appear unremarkable. There is osteopenia with extensive degenerative
changes of the spine. There is grade 1 L2-L3 retrolisthesis.
IMPRESSION: Pancolitis versus less likely sigmoid diverticulitis. Correlation
with clinical exam and stool cultures recommended. No bowel
obstruction.

## 2016-12-18 DIAGNOSIS — H353113 Nonexudative age-related macular degeneration, right eye, advanced atrophic without subfoveal involvement: Secondary | ICD-10-CM | POA: Diagnosis not present

## 2016-12-18 DIAGNOSIS — H35423 Microcystoid degeneration of retina, bilateral: Secondary | ICD-10-CM | POA: Diagnosis not present

## 2016-12-18 DIAGNOSIS — H353221 Exudative age-related macular degeneration, left eye, with active choroidal neovascularization: Secondary | ICD-10-CM | POA: Diagnosis not present

## 2016-12-18 DIAGNOSIS — H43813 Vitreous degeneration, bilateral: Secondary | ICD-10-CM | POA: Diagnosis not present

## 2016-12-22 DIAGNOSIS — H5005 Alternating esotropia: Secondary | ICD-10-CM | POA: Diagnosis not present

## 2016-12-22 DIAGNOSIS — H52223 Regular astigmatism, bilateral: Secondary | ICD-10-CM | POA: Diagnosis not present

## 2017-02-02 DIAGNOSIS — H353221 Exudative age-related macular degeneration, left eye, with active choroidal neovascularization: Secondary | ICD-10-CM | POA: Diagnosis not present

## 2017-02-02 DIAGNOSIS — H34212 Partial retinal artery occlusion, left eye: Secondary | ICD-10-CM | POA: Diagnosis not present

## 2017-02-02 DIAGNOSIS — H43813 Vitreous degeneration, bilateral: Secondary | ICD-10-CM | POA: Diagnosis not present

## 2017-02-02 DIAGNOSIS — H353113 Nonexudative age-related macular degeneration, right eye, advanced atrophic without subfoveal involvement: Secondary | ICD-10-CM | POA: Diagnosis not present

## 2017-02-19 DIAGNOSIS — J385 Laryngeal spasm: Secondary | ICD-10-CM | POA: Diagnosis not present

## 2017-03-15 DIAGNOSIS — M19011 Primary osteoarthritis, right shoulder: Secondary | ICD-10-CM | POA: Diagnosis not present

## 2017-03-16 DIAGNOSIS — H353113 Nonexudative age-related macular degeneration, right eye, advanced atrophic without subfoveal involvement: Secondary | ICD-10-CM | POA: Diagnosis not present

## 2017-03-16 DIAGNOSIS — H35423 Microcystoid degeneration of retina, bilateral: Secondary | ICD-10-CM | POA: Diagnosis not present

## 2017-03-16 DIAGNOSIS — H353221 Exudative age-related macular degeneration, left eye, with active choroidal neovascularization: Secondary | ICD-10-CM | POA: Diagnosis not present

## 2017-03-16 DIAGNOSIS — H34232 Retinal artery branch occlusion, left eye: Secondary | ICD-10-CM | POA: Diagnosis not present

## 2017-04-22 DIAGNOSIS — M47816 Spondylosis without myelopathy or radiculopathy, lumbar region: Secondary | ICD-10-CM | POA: Diagnosis not present

## 2017-04-22 DIAGNOSIS — M19012 Primary osteoarthritis, left shoulder: Secondary | ICD-10-CM | POA: Diagnosis not present

## 2017-05-03 DIAGNOSIS — H353113 Nonexudative age-related macular degeneration, right eye, advanced atrophic without subfoveal involvement: Secondary | ICD-10-CM | POA: Diagnosis not present

## 2017-05-03 DIAGNOSIS — H353221 Exudative age-related macular degeneration, left eye, with active choroidal neovascularization: Secondary | ICD-10-CM | POA: Diagnosis not present

## 2017-05-03 DIAGNOSIS — H34232 Retinal artery branch occlusion, left eye: Secondary | ICD-10-CM | POA: Diagnosis not present

## 2017-05-03 DIAGNOSIS — H43813 Vitreous degeneration, bilateral: Secondary | ICD-10-CM | POA: Diagnosis not present

## 2017-05-18 DIAGNOSIS — Z23 Encounter for immunization: Secondary | ICD-10-CM | POA: Diagnosis not present

## 2017-05-18 DIAGNOSIS — R634 Abnormal weight loss: Secondary | ICD-10-CM | POA: Diagnosis not present

## 2017-05-18 DIAGNOSIS — E7800 Pure hypercholesterolemia, unspecified: Secondary | ICD-10-CM | POA: Diagnosis not present

## 2017-05-18 DIAGNOSIS — I10 Essential (primary) hypertension: Secondary | ICD-10-CM | POA: Diagnosis not present

## 2017-06-11 DIAGNOSIS — H35423 Microcystoid degeneration of retina, bilateral: Secondary | ICD-10-CM | POA: Diagnosis not present

## 2017-06-11 DIAGNOSIS — H353221 Exudative age-related macular degeneration, left eye, with active choroidal neovascularization: Secondary | ICD-10-CM | POA: Diagnosis not present

## 2017-06-11 DIAGNOSIS — H34232 Retinal artery branch occlusion, left eye: Secondary | ICD-10-CM | POA: Diagnosis not present

## 2017-06-11 DIAGNOSIS — H353113 Nonexudative age-related macular degeneration, right eye, advanced atrophic without subfoveal involvement: Secondary | ICD-10-CM | POA: Diagnosis not present

## 2017-07-14 DIAGNOSIS — H353221 Exudative age-related macular degeneration, left eye, with active choroidal neovascularization: Secondary | ICD-10-CM | POA: Diagnosis not present

## 2017-07-14 DIAGNOSIS — H353113 Nonexudative age-related macular degeneration, right eye, advanced atrophic without subfoveal involvement: Secondary | ICD-10-CM | POA: Diagnosis not present

## 2017-07-14 DIAGNOSIS — H35423 Microcystoid degeneration of retina, bilateral: Secondary | ICD-10-CM | POA: Diagnosis not present

## 2017-07-14 DIAGNOSIS — H34212 Partial retinal artery occlusion, left eye: Secondary | ICD-10-CM | POA: Diagnosis not present

## 2017-07-29 DIAGNOSIS — Z961 Presence of intraocular lens: Secondary | ICD-10-CM | POA: Diagnosis not present

## 2017-07-29 DIAGNOSIS — H353131 Nonexudative age-related macular degeneration, bilateral, early dry stage: Secondary | ICD-10-CM | POA: Diagnosis not present

## 2017-08-17 DIAGNOSIS — H353221 Exudative age-related macular degeneration, left eye, with active choroidal neovascularization: Secondary | ICD-10-CM | POA: Diagnosis not present

## 2017-08-17 DIAGNOSIS — H353113 Nonexudative age-related macular degeneration, right eye, advanced atrophic without subfoveal involvement: Secondary | ICD-10-CM | POA: Diagnosis not present

## 2017-08-17 DIAGNOSIS — H35423 Microcystoid degeneration of retina, bilateral: Secondary | ICD-10-CM | POA: Diagnosis not present

## 2017-08-17 DIAGNOSIS — H34232 Retinal artery branch occlusion, left eye: Secondary | ICD-10-CM | POA: Diagnosis not present

## 2017-08-27 DIAGNOSIS — B029 Zoster without complications: Secondary | ICD-10-CM | POA: Diagnosis not present

## 2017-09-04 DIAGNOSIS — B029 Zoster without complications: Secondary | ICD-10-CM | POA: Diagnosis not present

## 2017-09-08 DIAGNOSIS — B029 Zoster without complications: Secondary | ICD-10-CM | POA: Diagnosis not present

## 2017-09-08 DIAGNOSIS — R634 Abnormal weight loss: Secondary | ICD-10-CM | POA: Diagnosis not present

## 2017-11-15 DIAGNOSIS — H903 Sensorineural hearing loss, bilateral: Secondary | ICD-10-CM | POA: Diagnosis not present

## 2017-11-19 DIAGNOSIS — H34232 Retinal artery branch occlusion, left eye: Secondary | ICD-10-CM | POA: Diagnosis not present

## 2017-11-19 DIAGNOSIS — H35423 Microcystoid degeneration of retina, bilateral: Secondary | ICD-10-CM | POA: Diagnosis not present

## 2017-11-19 DIAGNOSIS — H353221 Exudative age-related macular degeneration, left eye, with active choroidal neovascularization: Secondary | ICD-10-CM | POA: Diagnosis not present

## 2017-11-19 DIAGNOSIS — H353113 Nonexudative age-related macular degeneration, right eye, advanced atrophic without subfoveal involvement: Secondary | ICD-10-CM | POA: Diagnosis not present

## 2018-01-04 DIAGNOSIS — B0229 Other postherpetic nervous system involvement: Secondary | ICD-10-CM | POA: Diagnosis not present

## 2018-01-04 DIAGNOSIS — I872 Venous insufficiency (chronic) (peripheral): Secondary | ICD-10-CM | POA: Diagnosis not present

## 2018-01-04 DIAGNOSIS — R351 Nocturia: Secondary | ICD-10-CM | POA: Diagnosis not present

## 2018-01-26 DIAGNOSIS — H34232 Retinal artery branch occlusion, left eye: Secondary | ICD-10-CM | POA: Diagnosis not present

## 2018-01-26 DIAGNOSIS — H353113 Nonexudative age-related macular degeneration, right eye, advanced atrophic without subfoveal involvement: Secondary | ICD-10-CM | POA: Diagnosis not present

## 2018-01-26 DIAGNOSIS — H35423 Microcystoid degeneration of retina, bilateral: Secondary | ICD-10-CM | POA: Diagnosis not present

## 2018-01-26 DIAGNOSIS — H353221 Exudative age-related macular degeneration, left eye, with active choroidal neovascularization: Secondary | ICD-10-CM | POA: Diagnosis not present

## 2018-04-06 DIAGNOSIS — H353113 Nonexudative age-related macular degeneration, right eye, advanced atrophic without subfoveal involvement: Secondary | ICD-10-CM | POA: Diagnosis not present

## 2018-04-06 DIAGNOSIS — H34232 Retinal artery branch occlusion, left eye: Secondary | ICD-10-CM | POA: Diagnosis not present

## 2018-04-06 DIAGNOSIS — H43813 Vitreous degeneration, bilateral: Secondary | ICD-10-CM | POA: Diagnosis not present

## 2018-04-06 DIAGNOSIS — H353221 Exudative age-related macular degeneration, left eye, with active choroidal neovascularization: Secondary | ICD-10-CM | POA: Diagnosis not present

## 2018-05-09 DIAGNOSIS — Z23 Encounter for immunization: Secondary | ICD-10-CM | POA: Diagnosis not present

## 2018-05-09 DIAGNOSIS — E559 Vitamin D deficiency, unspecified: Secondary | ICD-10-CM | POA: Diagnosis not present

## 2018-05-09 DIAGNOSIS — M810 Age-related osteoporosis without current pathological fracture: Secondary | ICD-10-CM | POA: Diagnosis not present

## 2018-05-09 DIAGNOSIS — E7800 Pure hypercholesterolemia, unspecified: Secondary | ICD-10-CM | POA: Diagnosis not present

## 2018-05-09 DIAGNOSIS — I10 Essential (primary) hypertension: Secondary | ICD-10-CM | POA: Diagnosis not present

## 2018-06-22 DIAGNOSIS — H353113 Nonexudative age-related macular degeneration, right eye, advanced atrophic without subfoveal involvement: Secondary | ICD-10-CM | POA: Diagnosis not present

## 2018-06-22 DIAGNOSIS — H353221 Exudative age-related macular degeneration, left eye, with active choroidal neovascularization: Secondary | ICD-10-CM | POA: Diagnosis not present

## 2018-06-22 DIAGNOSIS — H35423 Microcystoid degeneration of retina, bilateral: Secondary | ICD-10-CM | POA: Diagnosis not present

## 2018-06-22 DIAGNOSIS — H34232 Retinal artery branch occlusion, left eye: Secondary | ICD-10-CM | POA: Diagnosis not present

## 2018-09-07 DIAGNOSIS — H353221 Exudative age-related macular degeneration, left eye, with active choroidal neovascularization: Secondary | ICD-10-CM | POA: Diagnosis not present

## 2018-11-23 DIAGNOSIS — H34232 Retinal artery branch occlusion, left eye: Secondary | ICD-10-CM | POA: Diagnosis not present

## 2018-11-23 DIAGNOSIS — H353221 Exudative age-related macular degeneration, left eye, with active choroidal neovascularization: Secondary | ICD-10-CM | POA: Diagnosis not present

## 2018-11-23 DIAGNOSIS — H35423 Microcystoid degeneration of retina, bilateral: Secondary | ICD-10-CM | POA: Diagnosis not present

## 2018-11-23 DIAGNOSIS — H353113 Nonexudative age-related macular degeneration, right eye, advanced atrophic without subfoveal involvement: Secondary | ICD-10-CM | POA: Diagnosis not present

## 2019-01-23 DIAGNOSIS — E7800 Pure hypercholesterolemia, unspecified: Secondary | ICD-10-CM | POA: Diagnosis not present

## 2019-01-23 DIAGNOSIS — I10 Essential (primary) hypertension: Secondary | ICD-10-CM | POA: Diagnosis not present

## 2019-01-23 DIAGNOSIS — E559 Vitamin D deficiency, unspecified: Secondary | ICD-10-CM | POA: Diagnosis not present

## 2019-01-23 DIAGNOSIS — Z0000 Encounter for general adult medical examination without abnormal findings: Secondary | ICD-10-CM | POA: Diagnosis not present

## 2019-01-23 DIAGNOSIS — M810 Age-related osteoporosis without current pathological fracture: Secondary | ICD-10-CM | POA: Diagnosis not present

## 2019-01-25 DIAGNOSIS — H353113 Nonexudative age-related macular degeneration, right eye, advanced atrophic without subfoveal involvement: Secondary | ICD-10-CM | POA: Diagnosis not present

## 2019-01-25 DIAGNOSIS — H353221 Exudative age-related macular degeneration, left eye, with active choroidal neovascularization: Secondary | ICD-10-CM | POA: Diagnosis not present

## 2019-01-25 DIAGNOSIS — H43813 Vitreous degeneration, bilateral: Secondary | ICD-10-CM | POA: Diagnosis not present

## 2019-01-25 DIAGNOSIS — H35423 Microcystoid degeneration of retina, bilateral: Secondary | ICD-10-CM | POA: Diagnosis not present

## 2019-01-30 DIAGNOSIS — Z23 Encounter for immunization: Secondary | ICD-10-CM | POA: Diagnosis not present

## 2019-03-22 DIAGNOSIS — H34212 Partial retinal artery occlusion, left eye: Secondary | ICD-10-CM | POA: Diagnosis not present

## 2019-03-22 DIAGNOSIS — H353114 Nonexudative age-related macular degeneration, right eye, advanced atrophic with subfoveal involvement: Secondary | ICD-10-CM | POA: Diagnosis not present

## 2019-03-22 DIAGNOSIS — H35423 Microcystoid degeneration of retina, bilateral: Secondary | ICD-10-CM | POA: Diagnosis not present

## 2019-03-22 DIAGNOSIS — H353221 Exudative age-related macular degeneration, left eye, with active choroidal neovascularization: Secondary | ICD-10-CM | POA: Diagnosis not present

## 2019-04-18 DIAGNOSIS — I10 Essential (primary) hypertension: Secondary | ICD-10-CM | POA: Diagnosis not present

## 2019-04-18 DIAGNOSIS — Z9181 History of falling: Secondary | ICD-10-CM | POA: Diagnosis not present

## 2019-04-18 DIAGNOSIS — R55 Syncope and collapse: Secondary | ICD-10-CM | POA: Diagnosis not present

## 2019-04-22 DIAGNOSIS — M159 Polyosteoarthritis, unspecified: Secondary | ICD-10-CM | POA: Diagnosis not present

## 2019-04-22 DIAGNOSIS — I6523 Occlusion and stenosis of bilateral carotid arteries: Secondary | ICD-10-CM | POA: Diagnosis not present

## 2019-04-22 DIAGNOSIS — I10 Essential (primary) hypertension: Secondary | ICD-10-CM | POA: Diagnosis not present

## 2019-04-22 DIAGNOSIS — B0229 Other postherpetic nervous system involvement: Secondary | ICD-10-CM | POA: Diagnosis not present

## 2019-04-22 DIAGNOSIS — M8580 Other specified disorders of bone density and structure, unspecified site: Secondary | ICD-10-CM | POA: Diagnosis not present

## 2019-04-22 DIAGNOSIS — M109 Gout, unspecified: Secondary | ICD-10-CM | POA: Diagnosis not present

## 2019-04-22 DIAGNOSIS — J383 Other diseases of vocal cords: Secondary | ICD-10-CM | POA: Diagnosis not present

## 2019-04-22 DIAGNOSIS — I872 Venous insufficiency (chronic) (peripheral): Secondary | ICD-10-CM | POA: Diagnosis not present

## 2019-04-22 DIAGNOSIS — M5136 Other intervertebral disc degeneration, lumbar region: Secondary | ICD-10-CM | POA: Diagnosis not present

## 2019-04-25 DIAGNOSIS — M109 Gout, unspecified: Secondary | ICD-10-CM | POA: Diagnosis not present

## 2019-04-25 DIAGNOSIS — M5136 Other intervertebral disc degeneration, lumbar region: Secondary | ICD-10-CM | POA: Diagnosis not present

## 2019-04-25 DIAGNOSIS — I10 Essential (primary) hypertension: Secondary | ICD-10-CM | POA: Diagnosis not present

## 2019-04-25 DIAGNOSIS — B0229 Other postherpetic nervous system involvement: Secondary | ICD-10-CM | POA: Diagnosis not present

## 2019-04-25 DIAGNOSIS — I6523 Occlusion and stenosis of bilateral carotid arteries: Secondary | ICD-10-CM | POA: Diagnosis not present

## 2019-04-25 DIAGNOSIS — M8580 Other specified disorders of bone density and structure, unspecified site: Secondary | ICD-10-CM | POA: Diagnosis not present

## 2019-04-25 DIAGNOSIS — J383 Other diseases of vocal cords: Secondary | ICD-10-CM | POA: Diagnosis not present

## 2019-04-25 DIAGNOSIS — M159 Polyosteoarthritis, unspecified: Secondary | ICD-10-CM | POA: Diagnosis not present

## 2019-04-25 DIAGNOSIS — I872 Venous insufficiency (chronic) (peripheral): Secondary | ICD-10-CM | POA: Diagnosis not present

## 2019-04-26 DIAGNOSIS — I10 Essential (primary) hypertension: Secondary | ICD-10-CM | POA: Diagnosis not present

## 2019-04-26 DIAGNOSIS — M109 Gout, unspecified: Secondary | ICD-10-CM | POA: Diagnosis not present

## 2019-04-26 DIAGNOSIS — M159 Polyosteoarthritis, unspecified: Secondary | ICD-10-CM | POA: Diagnosis not present

## 2019-04-26 DIAGNOSIS — M8580 Other specified disorders of bone density and structure, unspecified site: Secondary | ICD-10-CM | POA: Diagnosis not present

## 2019-04-26 DIAGNOSIS — M5136 Other intervertebral disc degeneration, lumbar region: Secondary | ICD-10-CM | POA: Diagnosis not present

## 2019-04-26 DIAGNOSIS — B0229 Other postherpetic nervous system involvement: Secondary | ICD-10-CM | POA: Diagnosis not present

## 2019-04-26 DIAGNOSIS — I6523 Occlusion and stenosis of bilateral carotid arteries: Secondary | ICD-10-CM | POA: Diagnosis not present

## 2019-04-26 DIAGNOSIS — I872 Venous insufficiency (chronic) (peripheral): Secondary | ICD-10-CM | POA: Diagnosis not present

## 2019-04-26 DIAGNOSIS — J383 Other diseases of vocal cords: Secondary | ICD-10-CM | POA: Diagnosis not present

## 2019-05-01 DIAGNOSIS — M8580 Other specified disorders of bone density and structure, unspecified site: Secondary | ICD-10-CM | POA: Diagnosis not present

## 2019-05-01 DIAGNOSIS — M5136 Other intervertebral disc degeneration, lumbar region: Secondary | ICD-10-CM | POA: Diagnosis not present

## 2019-05-01 DIAGNOSIS — I10 Essential (primary) hypertension: Secondary | ICD-10-CM | POA: Diagnosis not present

## 2019-05-01 DIAGNOSIS — I6523 Occlusion and stenosis of bilateral carotid arteries: Secondary | ICD-10-CM | POA: Diagnosis not present

## 2019-05-01 DIAGNOSIS — M109 Gout, unspecified: Secondary | ICD-10-CM | POA: Diagnosis not present

## 2019-05-01 DIAGNOSIS — B0229 Other postherpetic nervous system involvement: Secondary | ICD-10-CM | POA: Diagnosis not present

## 2019-05-01 DIAGNOSIS — J383 Other diseases of vocal cords: Secondary | ICD-10-CM | POA: Diagnosis not present

## 2019-05-01 DIAGNOSIS — M159 Polyosteoarthritis, unspecified: Secondary | ICD-10-CM | POA: Diagnosis not present

## 2019-05-01 DIAGNOSIS — I872 Venous insufficiency (chronic) (peripheral): Secondary | ICD-10-CM | POA: Diagnosis not present

## 2019-05-08 DIAGNOSIS — M109 Gout, unspecified: Secondary | ICD-10-CM | POA: Diagnosis not present

## 2019-05-08 DIAGNOSIS — M159 Polyosteoarthritis, unspecified: Secondary | ICD-10-CM | POA: Diagnosis not present

## 2019-05-08 DIAGNOSIS — I10 Essential (primary) hypertension: Secondary | ICD-10-CM | POA: Diagnosis not present

## 2019-05-08 DIAGNOSIS — M5136 Other intervertebral disc degeneration, lumbar region: Secondary | ICD-10-CM | POA: Diagnosis not present

## 2019-05-08 DIAGNOSIS — B0229 Other postherpetic nervous system involvement: Secondary | ICD-10-CM | POA: Diagnosis not present

## 2019-05-08 DIAGNOSIS — J383 Other diseases of vocal cords: Secondary | ICD-10-CM | POA: Diagnosis not present

## 2019-05-08 DIAGNOSIS — M8580 Other specified disorders of bone density and structure, unspecified site: Secondary | ICD-10-CM | POA: Diagnosis not present

## 2019-05-08 DIAGNOSIS — I6523 Occlusion and stenosis of bilateral carotid arteries: Secondary | ICD-10-CM | POA: Diagnosis not present

## 2019-05-08 DIAGNOSIS — I872 Venous insufficiency (chronic) (peripheral): Secondary | ICD-10-CM | POA: Diagnosis not present

## 2019-05-11 DIAGNOSIS — M5136 Other intervertebral disc degeneration, lumbar region: Secondary | ICD-10-CM | POA: Diagnosis not present

## 2019-05-11 DIAGNOSIS — M109 Gout, unspecified: Secondary | ICD-10-CM | POA: Diagnosis not present

## 2019-05-11 DIAGNOSIS — I6523 Occlusion and stenosis of bilateral carotid arteries: Secondary | ICD-10-CM | POA: Diagnosis not present

## 2019-05-11 DIAGNOSIS — I872 Venous insufficiency (chronic) (peripheral): Secondary | ICD-10-CM | POA: Diagnosis not present

## 2019-05-11 DIAGNOSIS — M159 Polyosteoarthritis, unspecified: Secondary | ICD-10-CM | POA: Diagnosis not present

## 2019-05-11 DIAGNOSIS — B0229 Other postherpetic nervous system involvement: Secondary | ICD-10-CM | POA: Diagnosis not present

## 2019-05-11 DIAGNOSIS — J383 Other diseases of vocal cords: Secondary | ICD-10-CM | POA: Diagnosis not present

## 2019-05-11 DIAGNOSIS — I10 Essential (primary) hypertension: Secondary | ICD-10-CM | POA: Diagnosis not present

## 2019-05-11 DIAGNOSIS — M8580 Other specified disorders of bone density and structure, unspecified site: Secondary | ICD-10-CM | POA: Diagnosis not present

## 2019-05-15 DIAGNOSIS — I6523 Occlusion and stenosis of bilateral carotid arteries: Secondary | ICD-10-CM | POA: Diagnosis not present

## 2019-05-15 DIAGNOSIS — M8580 Other specified disorders of bone density and structure, unspecified site: Secondary | ICD-10-CM | POA: Diagnosis not present

## 2019-05-15 DIAGNOSIS — M5136 Other intervertebral disc degeneration, lumbar region: Secondary | ICD-10-CM | POA: Diagnosis not present

## 2019-05-15 DIAGNOSIS — I872 Venous insufficiency (chronic) (peripheral): Secondary | ICD-10-CM | POA: Diagnosis not present

## 2019-05-15 DIAGNOSIS — I10 Essential (primary) hypertension: Secondary | ICD-10-CM | POA: Diagnosis not present

## 2019-05-15 DIAGNOSIS — M159 Polyosteoarthritis, unspecified: Secondary | ICD-10-CM | POA: Diagnosis not present

## 2019-05-15 DIAGNOSIS — J383 Other diseases of vocal cords: Secondary | ICD-10-CM | POA: Diagnosis not present

## 2019-05-15 DIAGNOSIS — M109 Gout, unspecified: Secondary | ICD-10-CM | POA: Diagnosis not present

## 2019-05-15 DIAGNOSIS — B0229 Other postherpetic nervous system involvement: Secondary | ICD-10-CM | POA: Diagnosis not present

## 2019-05-19 DIAGNOSIS — E7841 Elevated Lipoprotein(a): Secondary | ICD-10-CM | POA: Diagnosis not present

## 2019-05-19 DIAGNOSIS — M109 Gout, unspecified: Secondary | ICD-10-CM | POA: Diagnosis not present

## 2019-05-19 DIAGNOSIS — E7800 Pure hypercholesterolemia, unspecified: Secondary | ICD-10-CM | POA: Diagnosis not present

## 2019-05-19 DIAGNOSIS — I872 Venous insufficiency (chronic) (peripheral): Secondary | ICD-10-CM | POA: Diagnosis not present

## 2019-05-19 DIAGNOSIS — Z8542 Personal history of malignant neoplasm of other parts of uterus: Secondary | ICD-10-CM | POA: Diagnosis not present

## 2019-05-19 DIAGNOSIS — I6523 Occlusion and stenosis of bilateral carotid arteries: Secondary | ICD-10-CM | POA: Diagnosis not present

## 2019-05-19 DIAGNOSIS — M5136 Other intervertebral disc degeneration, lumbar region: Secondary | ICD-10-CM | POA: Diagnosis not present

## 2019-05-19 DIAGNOSIS — M159 Polyosteoarthritis, unspecified: Secondary | ICD-10-CM | POA: Diagnosis not present

## 2019-05-19 DIAGNOSIS — B0229 Other postherpetic nervous system involvement: Secondary | ICD-10-CM | POA: Diagnosis not present

## 2019-05-19 DIAGNOSIS — I10 Essential (primary) hypertension: Secondary | ICD-10-CM | POA: Diagnosis not present

## 2019-05-19 DIAGNOSIS — M810 Age-related osteoporosis without current pathological fracture: Secondary | ICD-10-CM | POA: Diagnosis not present

## 2019-05-19 DIAGNOSIS — J383 Other diseases of vocal cords: Secondary | ICD-10-CM | POA: Diagnosis not present

## 2019-05-19 DIAGNOSIS — M8580 Other specified disorders of bone density and structure, unspecified site: Secondary | ICD-10-CM | POA: Diagnosis not present

## 2019-05-23 DIAGNOSIS — H353114 Nonexudative age-related macular degeneration, right eye, advanced atrophic with subfoveal involvement: Secondary | ICD-10-CM | POA: Diagnosis not present

## 2019-05-23 DIAGNOSIS — H353221 Exudative age-related macular degeneration, left eye, with active choroidal neovascularization: Secondary | ICD-10-CM | POA: Diagnosis not present

## 2019-05-23 DIAGNOSIS — H43813 Vitreous degeneration, bilateral: Secondary | ICD-10-CM | POA: Diagnosis not present

## 2019-05-23 DIAGNOSIS — H34232 Retinal artery branch occlusion, left eye: Secondary | ICD-10-CM | POA: Diagnosis not present

## 2019-05-26 DIAGNOSIS — I872 Venous insufficiency (chronic) (peripheral): Secondary | ICD-10-CM | POA: Diagnosis not present

## 2019-05-26 DIAGNOSIS — M109 Gout, unspecified: Secondary | ICD-10-CM | POA: Diagnosis not present

## 2019-05-26 DIAGNOSIS — J383 Other diseases of vocal cords: Secondary | ICD-10-CM | POA: Diagnosis not present

## 2019-05-26 DIAGNOSIS — M8580 Other specified disorders of bone density and structure, unspecified site: Secondary | ICD-10-CM | POA: Diagnosis not present

## 2019-05-26 DIAGNOSIS — M159 Polyosteoarthritis, unspecified: Secondary | ICD-10-CM | POA: Diagnosis not present

## 2019-05-26 DIAGNOSIS — I6523 Occlusion and stenosis of bilateral carotid arteries: Secondary | ICD-10-CM | POA: Diagnosis not present

## 2019-05-26 DIAGNOSIS — M5136 Other intervertebral disc degeneration, lumbar region: Secondary | ICD-10-CM | POA: Diagnosis not present

## 2019-05-26 DIAGNOSIS — I10 Essential (primary) hypertension: Secondary | ICD-10-CM | POA: Diagnosis not present

## 2019-05-26 DIAGNOSIS — B0229 Other postherpetic nervous system involvement: Secondary | ICD-10-CM | POA: Diagnosis not present

## 2019-06-30 DIAGNOSIS — M159 Polyosteoarthritis, unspecified: Secondary | ICD-10-CM | POA: Diagnosis not present

## 2019-06-30 DIAGNOSIS — E7800 Pure hypercholesterolemia, unspecified: Secondary | ICD-10-CM | POA: Diagnosis not present

## 2019-06-30 DIAGNOSIS — M810 Age-related osteoporosis without current pathological fracture: Secondary | ICD-10-CM | POA: Diagnosis not present

## 2019-06-30 DIAGNOSIS — E785 Hyperlipidemia, unspecified: Secondary | ICD-10-CM | POA: Diagnosis not present

## 2019-06-30 DIAGNOSIS — Z8542 Personal history of malignant neoplasm of other parts of uterus: Secondary | ICD-10-CM | POA: Diagnosis not present

## 2019-06-30 DIAGNOSIS — I10 Essential (primary) hypertension: Secondary | ICD-10-CM | POA: Diagnosis not present

## 2019-07-05 DIAGNOSIS — H353221 Exudative age-related macular degeneration, left eye, with active choroidal neovascularization: Secondary | ICD-10-CM | POA: Diagnosis not present

## 2019-07-05 DIAGNOSIS — H353114 Nonexudative age-related macular degeneration, right eye, advanced atrophic with subfoveal involvement: Secondary | ICD-10-CM | POA: Diagnosis not present

## 2019-07-05 DIAGNOSIS — H43813 Vitreous degeneration, bilateral: Secondary | ICD-10-CM | POA: Diagnosis not present

## 2019-07-05 DIAGNOSIS — H34232 Retinal artery branch occlusion, left eye: Secondary | ICD-10-CM | POA: Diagnosis not present

## 2019-07-28 DIAGNOSIS — M159 Polyosteoarthritis, unspecified: Secondary | ICD-10-CM | POA: Diagnosis not present

## 2019-07-28 DIAGNOSIS — Z8542 Personal history of malignant neoplasm of other parts of uterus: Secondary | ICD-10-CM | POA: Diagnosis not present

## 2019-07-28 DIAGNOSIS — E7800 Pure hypercholesterolemia, unspecified: Secondary | ICD-10-CM | POA: Diagnosis not present

## 2019-07-28 DIAGNOSIS — E7849 Other hyperlipidemia: Secondary | ICD-10-CM | POA: Diagnosis not present

## 2019-07-28 DIAGNOSIS — M810 Age-related osteoporosis without current pathological fracture: Secondary | ICD-10-CM | POA: Diagnosis not present

## 2019-07-28 DIAGNOSIS — I10 Essential (primary) hypertension: Secondary | ICD-10-CM | POA: Diagnosis not present

## 2019-08-16 DIAGNOSIS — H353221 Exudative age-related macular degeneration, left eye, with active choroidal neovascularization: Secondary | ICD-10-CM | POA: Diagnosis not present

## 2019-08-16 DIAGNOSIS — H43813 Vitreous degeneration, bilateral: Secondary | ICD-10-CM | POA: Diagnosis not present

## 2019-08-16 DIAGNOSIS — H34232 Retinal artery branch occlusion, left eye: Secondary | ICD-10-CM | POA: Diagnosis not present

## 2019-08-16 DIAGNOSIS — H353114 Nonexudative age-related macular degeneration, right eye, advanced atrophic with subfoveal involvement: Secondary | ICD-10-CM | POA: Diagnosis not present

## 2019-09-19 DIAGNOSIS — H353221 Exudative age-related macular degeneration, left eye, with active choroidal neovascularization: Secondary | ICD-10-CM | POA: Diagnosis not present

## 2019-09-19 DIAGNOSIS — H353114 Nonexudative age-related macular degeneration, right eye, advanced atrophic with subfoveal involvement: Secondary | ICD-10-CM | POA: Diagnosis not present

## 2019-09-19 DIAGNOSIS — H34232 Retinal artery branch occlusion, left eye: Secondary | ICD-10-CM | POA: Diagnosis not present

## 2019-09-19 DIAGNOSIS — H35423 Microcystoid degeneration of retina, bilateral: Secondary | ICD-10-CM | POA: Diagnosis not present

## 2019-09-26 DIAGNOSIS — E7800 Pure hypercholesterolemia, unspecified: Secondary | ICD-10-CM | POA: Diagnosis not present

## 2019-09-26 DIAGNOSIS — I10 Essential (primary) hypertension: Secondary | ICD-10-CM | POA: Diagnosis not present

## 2019-09-26 DIAGNOSIS — Z8542 Personal history of malignant neoplasm of other parts of uterus: Secondary | ICD-10-CM | POA: Diagnosis not present

## 2019-09-26 DIAGNOSIS — E7849 Other hyperlipidemia: Secondary | ICD-10-CM | POA: Diagnosis not present

## 2019-09-26 DIAGNOSIS — M810 Age-related osteoporosis without current pathological fracture: Secondary | ICD-10-CM | POA: Diagnosis not present

## 2019-09-26 DIAGNOSIS — M159 Polyosteoarthritis, unspecified: Secondary | ICD-10-CM | POA: Diagnosis not present

## 2019-10-24 DIAGNOSIS — H353114 Nonexudative age-related macular degeneration, right eye, advanced atrophic with subfoveal involvement: Secondary | ICD-10-CM | POA: Diagnosis not present

## 2019-10-24 DIAGNOSIS — H353221 Exudative age-related macular degeneration, left eye, with active choroidal neovascularization: Secondary | ICD-10-CM | POA: Diagnosis not present

## 2019-11-21 DIAGNOSIS — H353221 Exudative age-related macular degeneration, left eye, with active choroidal neovascularization: Secondary | ICD-10-CM | POA: Diagnosis not present

## 2019-11-21 DIAGNOSIS — H353114 Nonexudative age-related macular degeneration, right eye, advanced atrophic with subfoveal involvement: Secondary | ICD-10-CM | POA: Diagnosis not present

## 2019-12-22 DIAGNOSIS — H353221 Exudative age-related macular degeneration, left eye, with active choroidal neovascularization: Secondary | ICD-10-CM | POA: Diagnosis not present

## 2019-12-22 DIAGNOSIS — H353114 Nonexudative age-related macular degeneration, right eye, advanced atrophic with subfoveal involvement: Secondary | ICD-10-CM | POA: Diagnosis not present

## 2019-12-25 DIAGNOSIS — E7800 Pure hypercholesterolemia, unspecified: Secondary | ICD-10-CM | POA: Diagnosis not present

## 2019-12-25 DIAGNOSIS — E7849 Other hyperlipidemia: Secondary | ICD-10-CM | POA: Diagnosis not present

## 2019-12-25 DIAGNOSIS — Z8542 Personal history of malignant neoplasm of other parts of uterus: Secondary | ICD-10-CM | POA: Diagnosis not present

## 2019-12-25 DIAGNOSIS — I10 Essential (primary) hypertension: Secondary | ICD-10-CM | POA: Diagnosis not present

## 2019-12-25 DIAGNOSIS — M159 Polyosteoarthritis, unspecified: Secondary | ICD-10-CM | POA: Diagnosis not present

## 2019-12-25 DIAGNOSIS — M810 Age-related osteoporosis without current pathological fracture: Secondary | ICD-10-CM | POA: Diagnosis not present

## 2020-01-16 DIAGNOSIS — M810 Age-related osteoporosis without current pathological fracture: Secondary | ICD-10-CM | POA: Diagnosis not present

## 2020-01-16 DIAGNOSIS — E7800 Pure hypercholesterolemia, unspecified: Secondary | ICD-10-CM | POA: Diagnosis not present

## 2020-01-16 DIAGNOSIS — Z8542 Personal history of malignant neoplasm of other parts of uterus: Secondary | ICD-10-CM | POA: Diagnosis not present

## 2020-01-16 DIAGNOSIS — M159 Polyosteoarthritis, unspecified: Secondary | ICD-10-CM | POA: Diagnosis not present

## 2020-01-16 DIAGNOSIS — I10 Essential (primary) hypertension: Secondary | ICD-10-CM | POA: Diagnosis not present

## 2020-01-19 DIAGNOSIS — H34232 Retinal artery branch occlusion, left eye: Secondary | ICD-10-CM | POA: Diagnosis not present

## 2020-01-19 DIAGNOSIS — H353221 Exudative age-related macular degeneration, left eye, with active choroidal neovascularization: Secondary | ICD-10-CM | POA: Diagnosis not present

## 2020-01-19 DIAGNOSIS — H35423 Microcystoid degeneration of retina, bilateral: Secondary | ICD-10-CM | POA: Diagnosis not present

## 2020-01-19 DIAGNOSIS — H353114 Nonexudative age-related macular degeneration, right eye, advanced atrophic with subfoveal involvement: Secondary | ICD-10-CM | POA: Diagnosis not present

## 2020-01-24 DIAGNOSIS — L989 Disorder of the skin and subcutaneous tissue, unspecified: Secondary | ICD-10-CM | POA: Diagnosis not present

## 2020-01-24 DIAGNOSIS — Z0000 Encounter for general adult medical examination without abnormal findings: Secondary | ICD-10-CM | POA: Diagnosis not present

## 2020-01-24 DIAGNOSIS — E559 Vitamin D deficiency, unspecified: Secondary | ICD-10-CM | POA: Diagnosis not present

## 2020-01-24 DIAGNOSIS — M810 Age-related osteoporosis without current pathological fracture: Secondary | ICD-10-CM | POA: Diagnosis not present

## 2020-01-24 DIAGNOSIS — E7800 Pure hypercholesterolemia, unspecified: Secondary | ICD-10-CM | POA: Diagnosis not present

## 2020-01-24 DIAGNOSIS — Z23 Encounter for immunization: Secondary | ICD-10-CM | POA: Diagnosis not present

## 2020-01-24 DIAGNOSIS — I10 Essential (primary) hypertension: Secondary | ICD-10-CM | POA: Diagnosis not present

## 2020-01-24 DIAGNOSIS — H6121 Impacted cerumen, right ear: Secondary | ICD-10-CM | POA: Diagnosis not present

## 2020-02-19 DIAGNOSIS — H353221 Exudative age-related macular degeneration, left eye, with active choroidal neovascularization: Secondary | ICD-10-CM | POA: Diagnosis not present

## 2020-02-19 DIAGNOSIS — H353114 Nonexudative age-related macular degeneration, right eye, advanced atrophic with subfoveal involvement: Secondary | ICD-10-CM | POA: Diagnosis not present

## 2020-03-08 DIAGNOSIS — D485 Neoplasm of uncertain behavior of skin: Secondary | ICD-10-CM | POA: Diagnosis not present

## 2020-03-08 DIAGNOSIS — C44619 Basal cell carcinoma of skin of left upper limb, including shoulder: Secondary | ICD-10-CM | POA: Diagnosis not present

## 2020-03-08 DIAGNOSIS — C44319 Basal cell carcinoma of skin of other parts of face: Secondary | ICD-10-CM | POA: Diagnosis not present

## 2020-03-08 DIAGNOSIS — L821 Other seborrheic keratosis: Secondary | ICD-10-CM | POA: Diagnosis not present

## 2020-03-08 DIAGNOSIS — L570 Actinic keratosis: Secondary | ICD-10-CM | POA: Diagnosis not present

## 2020-03-08 DIAGNOSIS — C44519 Basal cell carcinoma of skin of other part of trunk: Secondary | ICD-10-CM | POA: Diagnosis not present

## 2020-03-11 DIAGNOSIS — E7800 Pure hypercholesterolemia, unspecified: Secondary | ICD-10-CM | POA: Diagnosis not present

## 2020-03-11 DIAGNOSIS — K219 Gastro-esophageal reflux disease without esophagitis: Secondary | ICD-10-CM | POA: Diagnosis not present

## 2020-03-11 DIAGNOSIS — M159 Polyosteoarthritis, unspecified: Secondary | ICD-10-CM | POA: Diagnosis not present

## 2020-03-11 DIAGNOSIS — E785 Hyperlipidemia, unspecified: Secondary | ICD-10-CM | POA: Diagnosis not present

## 2020-03-11 DIAGNOSIS — Z8542 Personal history of malignant neoplasm of other parts of uterus: Secondary | ICD-10-CM | POA: Diagnosis not present

## 2020-03-11 DIAGNOSIS — M810 Age-related osteoporosis without current pathological fracture: Secondary | ICD-10-CM | POA: Diagnosis not present

## 2020-03-11 DIAGNOSIS — I10 Essential (primary) hypertension: Secondary | ICD-10-CM | POA: Diagnosis not present

## 2020-03-18 DIAGNOSIS — H353221 Exudative age-related macular degeneration, left eye, with active choroidal neovascularization: Secondary | ICD-10-CM | POA: Diagnosis not present

## 2020-03-18 DIAGNOSIS — H353114 Nonexudative age-related macular degeneration, right eye, advanced atrophic with subfoveal involvement: Secondary | ICD-10-CM | POA: Diagnosis not present

## 2020-04-11 DIAGNOSIS — E785 Hyperlipidemia, unspecified: Secondary | ICD-10-CM | POA: Diagnosis not present

## 2020-04-11 DIAGNOSIS — Z8542 Personal history of malignant neoplasm of other parts of uterus: Secondary | ICD-10-CM | POA: Diagnosis not present

## 2020-04-11 DIAGNOSIS — K219 Gastro-esophageal reflux disease without esophagitis: Secondary | ICD-10-CM | POA: Diagnosis not present

## 2020-04-11 DIAGNOSIS — M810 Age-related osteoporosis without current pathological fracture: Secondary | ICD-10-CM | POA: Diagnosis not present

## 2020-04-11 DIAGNOSIS — E7849 Other hyperlipidemia: Secondary | ICD-10-CM | POA: Diagnosis not present

## 2020-04-11 DIAGNOSIS — M159 Polyosteoarthritis, unspecified: Secondary | ICD-10-CM | POA: Diagnosis not present

## 2020-04-11 DIAGNOSIS — E7800 Pure hypercholesterolemia, unspecified: Secondary | ICD-10-CM | POA: Diagnosis not present

## 2020-04-11 DIAGNOSIS — I10 Essential (primary) hypertension: Secondary | ICD-10-CM | POA: Diagnosis not present

## 2020-04-15 DIAGNOSIS — H353114 Nonexudative age-related macular degeneration, right eye, advanced atrophic with subfoveal involvement: Secondary | ICD-10-CM | POA: Diagnosis not present

## 2020-04-15 DIAGNOSIS — H353221 Exudative age-related macular degeneration, left eye, with active choroidal neovascularization: Secondary | ICD-10-CM | POA: Diagnosis not present

## 2020-05-27 DIAGNOSIS — H353114 Nonexudative age-related macular degeneration, right eye, advanced atrophic with subfoveal involvement: Secondary | ICD-10-CM | POA: Diagnosis not present

## 2020-05-27 DIAGNOSIS — H353221 Exudative age-related macular degeneration, left eye, with active choroidal neovascularization: Secondary | ICD-10-CM | POA: Diagnosis not present

## 2020-05-27 DIAGNOSIS — H35423 Microcystoid degeneration of retina, bilateral: Secondary | ICD-10-CM | POA: Diagnosis not present

## 2020-05-27 DIAGNOSIS — H34232 Retinal artery branch occlusion, left eye: Secondary | ICD-10-CM | POA: Diagnosis not present

## 2020-05-31 DIAGNOSIS — L905 Scar conditions and fibrosis of skin: Secondary | ICD-10-CM | POA: Diagnosis not present

## 2020-05-31 DIAGNOSIS — C44519 Basal cell carcinoma of skin of other part of trunk: Secondary | ICD-10-CM | POA: Diagnosis not present

## 2020-05-31 DIAGNOSIS — C44319 Basal cell carcinoma of skin of other parts of face: Secondary | ICD-10-CM | POA: Diagnosis not present

## 2020-06-25 DIAGNOSIS — H353114 Nonexudative age-related macular degeneration, right eye, advanced atrophic with subfoveal involvement: Secondary | ICD-10-CM | POA: Diagnosis not present

## 2020-06-25 DIAGNOSIS — H34232 Retinal artery branch occlusion, left eye: Secondary | ICD-10-CM | POA: Diagnosis not present

## 2020-06-25 DIAGNOSIS — H353221 Exudative age-related macular degeneration, left eye, with active choroidal neovascularization: Secondary | ICD-10-CM | POA: Diagnosis not present

## 2020-06-25 DIAGNOSIS — H35423 Microcystoid degeneration of retina, bilateral: Secondary | ICD-10-CM | POA: Diagnosis not present

## 2020-07-10 DIAGNOSIS — E7800 Pure hypercholesterolemia, unspecified: Secondary | ICD-10-CM | POA: Diagnosis not present

## 2020-07-10 DIAGNOSIS — M810 Age-related osteoporosis without current pathological fracture: Secondary | ICD-10-CM | POA: Diagnosis not present

## 2020-07-10 DIAGNOSIS — I10 Essential (primary) hypertension: Secondary | ICD-10-CM | POA: Diagnosis not present

## 2020-07-10 DIAGNOSIS — K219 Gastro-esophageal reflux disease without esophagitis: Secondary | ICD-10-CM | POA: Diagnosis not present

## 2020-07-10 DIAGNOSIS — E785 Hyperlipidemia, unspecified: Secondary | ICD-10-CM | POA: Diagnosis not present

## 2020-07-10 DIAGNOSIS — M159 Polyosteoarthritis, unspecified: Secondary | ICD-10-CM | POA: Diagnosis not present

## 2020-08-02 DIAGNOSIS — H353221 Exudative age-related macular degeneration, left eye, with active choroidal neovascularization: Secondary | ICD-10-CM | POA: Diagnosis not present

## 2020-08-02 DIAGNOSIS — H353114 Nonexudative age-related macular degeneration, right eye, advanced atrophic with subfoveal involvement: Secondary | ICD-10-CM | POA: Diagnosis not present

## 2020-08-14 DIAGNOSIS — I10 Essential (primary) hypertension: Secondary | ICD-10-CM | POA: Diagnosis not present

## 2020-08-14 DIAGNOSIS — M810 Age-related osteoporosis without current pathological fracture: Secondary | ICD-10-CM | POA: Diagnosis not present

## 2020-08-14 DIAGNOSIS — Z8542 Personal history of malignant neoplasm of other parts of uterus: Secondary | ICD-10-CM | POA: Diagnosis not present

## 2020-08-14 DIAGNOSIS — E7800 Pure hypercholesterolemia, unspecified: Secondary | ICD-10-CM | POA: Diagnosis not present

## 2020-08-14 DIAGNOSIS — M159 Polyosteoarthritis, unspecified: Secondary | ICD-10-CM | POA: Diagnosis not present

## 2020-08-14 DIAGNOSIS — K219 Gastro-esophageal reflux disease without esophagitis: Secondary | ICD-10-CM | POA: Diagnosis not present

## 2020-08-14 DIAGNOSIS — E785 Hyperlipidemia, unspecified: Secondary | ICD-10-CM | POA: Diagnosis not present

## 2020-09-02 DIAGNOSIS — H353114 Nonexudative age-related macular degeneration, right eye, advanced atrophic with subfoveal involvement: Secondary | ICD-10-CM | POA: Diagnosis not present

## 2020-09-02 DIAGNOSIS — H353221 Exudative age-related macular degeneration, left eye, with active choroidal neovascularization: Secondary | ICD-10-CM | POA: Diagnosis not present

## 2020-10-01 DIAGNOSIS — H353221 Exudative age-related macular degeneration, left eye, with active choroidal neovascularization: Secondary | ICD-10-CM | POA: Diagnosis not present

## 2020-10-01 DIAGNOSIS — H35423 Microcystoid degeneration of retina, bilateral: Secondary | ICD-10-CM | POA: Diagnosis not present

## 2020-10-01 DIAGNOSIS — H353114 Nonexudative age-related macular degeneration, right eye, advanced atrophic with subfoveal involvement: Secondary | ICD-10-CM | POA: Diagnosis not present

## 2020-10-01 DIAGNOSIS — H34232 Retinal artery branch occlusion, left eye: Secondary | ICD-10-CM | POA: Diagnosis not present

## 2020-10-30 DIAGNOSIS — M159 Polyosteoarthritis, unspecified: Secondary | ICD-10-CM | POA: Diagnosis not present

## 2020-10-30 DIAGNOSIS — M810 Age-related osteoporosis without current pathological fracture: Secondary | ICD-10-CM | POA: Diagnosis not present

## 2020-10-30 DIAGNOSIS — E7800 Pure hypercholesterolemia, unspecified: Secondary | ICD-10-CM | POA: Diagnosis not present

## 2020-10-30 DIAGNOSIS — K219 Gastro-esophageal reflux disease without esophagitis: Secondary | ICD-10-CM | POA: Diagnosis not present

## 2020-10-30 DIAGNOSIS — E785 Hyperlipidemia, unspecified: Secondary | ICD-10-CM | POA: Diagnosis not present

## 2020-10-30 DIAGNOSIS — I10 Essential (primary) hypertension: Secondary | ICD-10-CM | POA: Diagnosis not present

## 2020-10-30 DIAGNOSIS — Z8542 Personal history of malignant neoplasm of other parts of uterus: Secondary | ICD-10-CM | POA: Diagnosis not present

## 2020-11-26 DIAGNOSIS — H353221 Exudative age-related macular degeneration, left eye, with active choroidal neovascularization: Secondary | ICD-10-CM | POA: Diagnosis not present

## 2020-11-26 DIAGNOSIS — H353114 Nonexudative age-related macular degeneration, right eye, advanced atrophic with subfoveal involvement: Secondary | ICD-10-CM | POA: Diagnosis not present

## 2020-12-27 DIAGNOSIS — K219 Gastro-esophageal reflux disease without esophagitis: Secondary | ICD-10-CM | POA: Diagnosis not present

## 2020-12-27 DIAGNOSIS — M159 Polyosteoarthritis, unspecified: Secondary | ICD-10-CM | POA: Diagnosis not present

## 2020-12-27 DIAGNOSIS — E785 Hyperlipidemia, unspecified: Secondary | ICD-10-CM | POA: Diagnosis not present

## 2020-12-27 DIAGNOSIS — Z8542 Personal history of malignant neoplasm of other parts of uterus: Secondary | ICD-10-CM | POA: Diagnosis not present

## 2020-12-27 DIAGNOSIS — H353221 Exudative age-related macular degeneration, left eye, with active choroidal neovascularization: Secondary | ICD-10-CM | POA: Diagnosis not present

## 2020-12-27 DIAGNOSIS — I10 Essential (primary) hypertension: Secondary | ICD-10-CM | POA: Diagnosis not present

## 2020-12-27 DIAGNOSIS — E7800 Pure hypercholesterolemia, unspecified: Secondary | ICD-10-CM | POA: Diagnosis not present

## 2020-12-27 DIAGNOSIS — M810 Age-related osteoporosis without current pathological fracture: Secondary | ICD-10-CM | POA: Diagnosis not present

## 2020-12-27 DIAGNOSIS — H353114 Nonexudative age-related macular degeneration, right eye, advanced atrophic with subfoveal involvement: Secondary | ICD-10-CM | POA: Diagnosis not present

## 2021-01-24 DIAGNOSIS — H35423 Microcystoid degeneration of retina, bilateral: Secondary | ICD-10-CM | POA: Diagnosis not present

## 2021-01-24 DIAGNOSIS — H34232 Retinal artery branch occlusion, left eye: Secondary | ICD-10-CM | POA: Diagnosis not present

## 2021-01-24 DIAGNOSIS — H353114 Nonexudative age-related macular degeneration, right eye, advanced atrophic with subfoveal involvement: Secondary | ICD-10-CM | POA: Diagnosis not present

## 2021-01-24 DIAGNOSIS — H353221 Exudative age-related macular degeneration, left eye, with active choroidal neovascularization: Secondary | ICD-10-CM | POA: Diagnosis not present

## 2021-01-27 DIAGNOSIS — Z1389 Encounter for screening for other disorder: Secondary | ICD-10-CM | POA: Diagnosis not present

## 2021-01-27 DIAGNOSIS — Z0000 Encounter for general adult medical examination without abnormal findings: Secondary | ICD-10-CM | POA: Diagnosis not present

## 2021-01-28 DIAGNOSIS — I10 Essential (primary) hypertension: Secondary | ICD-10-CM | POA: Diagnosis not present

## 2021-01-28 DIAGNOSIS — L853 Xerosis cutis: Secondary | ICD-10-CM | POA: Diagnosis not present

## 2021-01-28 DIAGNOSIS — E559 Vitamin D deficiency, unspecified: Secondary | ICD-10-CM | POA: Diagnosis not present

## 2021-01-28 DIAGNOSIS — Z23 Encounter for immunization: Secondary | ICD-10-CM | POA: Diagnosis not present

## 2021-01-28 DIAGNOSIS — Z0000 Encounter for general adult medical examination without abnormal findings: Secondary | ICD-10-CM | POA: Diagnosis not present

## 2021-01-28 DIAGNOSIS — E7800 Pure hypercholesterolemia, unspecified: Secondary | ICD-10-CM | POA: Diagnosis not present

## 2021-01-28 DIAGNOSIS — M159 Polyosteoarthritis, unspecified: Secondary | ICD-10-CM | POA: Diagnosis not present

## 2021-01-28 DIAGNOSIS — M810 Age-related osteoporosis without current pathological fracture: Secondary | ICD-10-CM | POA: Diagnosis not present

## 2021-02-21 DIAGNOSIS — H353114 Nonexudative age-related macular degeneration, right eye, advanced atrophic with subfoveal involvement: Secondary | ICD-10-CM | POA: Diagnosis not present

## 2021-02-21 DIAGNOSIS — H353221 Exudative age-related macular degeneration, left eye, with active choroidal neovascularization: Secondary | ICD-10-CM | POA: Diagnosis not present

## 2021-03-21 DIAGNOSIS — H353221 Exudative age-related macular degeneration, left eye, with active choroidal neovascularization: Secondary | ICD-10-CM | POA: Diagnosis not present

## 2021-03-25 DIAGNOSIS — M810 Age-related osteoporosis without current pathological fracture: Secondary | ICD-10-CM | POA: Diagnosis not present

## 2021-03-25 DIAGNOSIS — I10 Essential (primary) hypertension: Secondary | ICD-10-CM | POA: Diagnosis not present

## 2021-03-25 DIAGNOSIS — E7800 Pure hypercholesterolemia, unspecified: Secondary | ICD-10-CM | POA: Diagnosis not present

## 2021-03-25 DIAGNOSIS — K219 Gastro-esophageal reflux disease without esophagitis: Secondary | ICD-10-CM | POA: Diagnosis not present

## 2021-03-25 DIAGNOSIS — M159 Polyosteoarthritis, unspecified: Secondary | ICD-10-CM | POA: Diagnosis not present

## 2021-04-21 DIAGNOSIS — H353114 Nonexudative age-related macular degeneration, right eye, advanced atrophic with subfoveal involvement: Secondary | ICD-10-CM | POA: Diagnosis not present

## 2021-04-21 DIAGNOSIS — H353221 Exudative age-related macular degeneration, left eye, with active choroidal neovascularization: Secondary | ICD-10-CM | POA: Diagnosis not present

## 2021-04-21 DIAGNOSIS — H34232 Retinal artery branch occlusion, left eye: Secondary | ICD-10-CM | POA: Diagnosis not present

## 2021-04-21 DIAGNOSIS — H35423 Microcystoid degeneration of retina, bilateral: Secondary | ICD-10-CM | POA: Diagnosis not present

## 2021-05-20 DIAGNOSIS — H34232 Retinal artery branch occlusion, left eye: Secondary | ICD-10-CM | POA: Diagnosis not present

## 2021-05-20 DIAGNOSIS — H353221 Exudative age-related macular degeneration, left eye, with active choroidal neovascularization: Secondary | ICD-10-CM | POA: Diagnosis not present

## 2021-05-20 DIAGNOSIS — H43813 Vitreous degeneration, bilateral: Secondary | ICD-10-CM | POA: Diagnosis not present

## 2021-05-20 DIAGNOSIS — H353114 Nonexudative age-related macular degeneration, right eye, advanced atrophic with subfoveal involvement: Secondary | ICD-10-CM | POA: Diagnosis not present

## 2021-06-17 DIAGNOSIS — H353221 Exudative age-related macular degeneration, left eye, with active choroidal neovascularization: Secondary | ICD-10-CM | POA: Diagnosis not present

## 2021-06-17 DIAGNOSIS — H43813 Vitreous degeneration, bilateral: Secondary | ICD-10-CM | POA: Diagnosis not present

## 2021-06-17 DIAGNOSIS — H353114 Nonexudative age-related macular degeneration, right eye, advanced atrophic with subfoveal involvement: Secondary | ICD-10-CM | POA: Diagnosis not present

## 2021-06-17 DIAGNOSIS — H34232 Retinal artery branch occlusion, left eye: Secondary | ICD-10-CM | POA: Diagnosis not present

## 2021-07-15 DIAGNOSIS — H353221 Exudative age-related macular degeneration, left eye, with active choroidal neovascularization: Secondary | ICD-10-CM | POA: Diagnosis not present

## 2021-07-15 DIAGNOSIS — H353114 Nonexudative age-related macular degeneration, right eye, advanced atrophic with subfoveal involvement: Secondary | ICD-10-CM | POA: Diagnosis not present

## 2021-08-25 DIAGNOSIS — H35373 Puckering of macula, bilateral: Secondary | ICD-10-CM | POA: Diagnosis not present

## 2021-08-25 DIAGNOSIS — H348322 Tributary (branch) retinal vein occlusion, left eye, stable: Secondary | ICD-10-CM | POA: Diagnosis not present

## 2021-08-25 DIAGNOSIS — H43813 Vitreous degeneration, bilateral: Secondary | ICD-10-CM | POA: Diagnosis not present

## 2021-08-25 DIAGNOSIS — H353221 Exudative age-related macular degeneration, left eye, with active choroidal neovascularization: Secondary | ICD-10-CM | POA: Diagnosis not present

## 2021-08-25 DIAGNOSIS — H353211 Exudative age-related macular degeneration, right eye, with active choroidal neovascularization: Secondary | ICD-10-CM | POA: Diagnosis not present

## 2021-09-22 DIAGNOSIS — H353221 Exudative age-related macular degeneration, left eye, with active choroidal neovascularization: Secondary | ICD-10-CM | POA: Diagnosis not present

## 2021-09-22 DIAGNOSIS — H353211 Exudative age-related macular degeneration, right eye, with active choroidal neovascularization: Secondary | ICD-10-CM | POA: Diagnosis not present

## 2021-10-21 DIAGNOSIS — H353221 Exudative age-related macular degeneration, left eye, with active choroidal neovascularization: Secondary | ICD-10-CM | POA: Diagnosis not present

## 2021-10-21 DIAGNOSIS — H353211 Exudative age-related macular degeneration, right eye, with active choroidal neovascularization: Secondary | ICD-10-CM | POA: Diagnosis not present

## 2021-11-18 DIAGNOSIS — H35373 Puckering of macula, bilateral: Secondary | ICD-10-CM | POA: Diagnosis not present

## 2021-11-18 DIAGNOSIS — H348322 Tributary (branch) retinal vein occlusion, left eye, stable: Secondary | ICD-10-CM | POA: Diagnosis not present

## 2021-11-18 DIAGNOSIS — H43813 Vitreous degeneration, bilateral: Secondary | ICD-10-CM | POA: Diagnosis not present

## 2021-11-18 DIAGNOSIS — H353211 Exudative age-related macular degeneration, right eye, with active choroidal neovascularization: Secondary | ICD-10-CM | POA: Diagnosis not present

## 2021-11-18 DIAGNOSIS — H353221 Exudative age-related macular degeneration, left eye, with active choroidal neovascularization: Secondary | ICD-10-CM | POA: Diagnosis not present

## 2021-11-25 DIAGNOSIS — M810 Age-related osteoporosis without current pathological fracture: Secondary | ICD-10-CM | POA: Diagnosis not present

## 2021-11-25 DIAGNOSIS — E7800 Pure hypercholesterolemia, unspecified: Secondary | ICD-10-CM | POA: Diagnosis not present

## 2021-11-25 DIAGNOSIS — K219 Gastro-esophageal reflux disease without esophagitis: Secondary | ICD-10-CM | POA: Diagnosis not present

## 2021-11-25 DIAGNOSIS — M159 Polyosteoarthritis, unspecified: Secondary | ICD-10-CM | POA: Diagnosis not present

## 2021-11-25 DIAGNOSIS — I10 Essential (primary) hypertension: Secondary | ICD-10-CM | POA: Diagnosis not present

## 2021-12-22 DIAGNOSIS — H353221 Exudative age-related macular degeneration, left eye, with active choroidal neovascularization: Secondary | ICD-10-CM | POA: Diagnosis not present

## 2021-12-22 DIAGNOSIS — H353211 Exudative age-related macular degeneration, right eye, with active choroidal neovascularization: Secondary | ICD-10-CM | POA: Diagnosis not present

## 2022-01-19 DIAGNOSIS — H43813 Vitreous degeneration, bilateral: Secondary | ICD-10-CM | POA: Diagnosis not present

## 2022-01-19 DIAGNOSIS — H35373 Puckering of macula, bilateral: Secondary | ICD-10-CM | POA: Diagnosis not present

## 2022-01-19 DIAGNOSIS — H353231 Exudative age-related macular degeneration, bilateral, with active choroidal neovascularization: Secondary | ICD-10-CM | POA: Diagnosis not present

## 2022-02-16 DIAGNOSIS — H35373 Puckering of macula, bilateral: Secondary | ICD-10-CM | POA: Diagnosis not present

## 2022-02-16 DIAGNOSIS — H43813 Vitreous degeneration, bilateral: Secondary | ICD-10-CM | POA: Diagnosis not present

## 2022-02-16 DIAGNOSIS — H353231 Exudative age-related macular degeneration, bilateral, with active choroidal neovascularization: Secondary | ICD-10-CM | POA: Diagnosis not present

## 2022-02-16 DIAGNOSIS — H34232 Retinal artery branch occlusion, left eye: Secondary | ICD-10-CM | POA: Diagnosis not present

## 2022-03-13 DIAGNOSIS — E559 Vitamin D deficiency, unspecified: Secondary | ICD-10-CM | POA: Diagnosis not present

## 2022-03-13 DIAGNOSIS — M810 Age-related osteoporosis without current pathological fracture: Secondary | ICD-10-CM | POA: Diagnosis not present

## 2022-03-13 DIAGNOSIS — M25551 Pain in right hip: Secondary | ICD-10-CM | POA: Diagnosis not present

## 2022-03-13 DIAGNOSIS — M25552 Pain in left hip: Secondary | ICD-10-CM | POA: Diagnosis not present

## 2022-03-13 DIAGNOSIS — E7800 Pure hypercholesterolemia, unspecified: Secondary | ICD-10-CM | POA: Diagnosis not present

## 2022-03-13 DIAGNOSIS — I10 Essential (primary) hypertension: Secondary | ICD-10-CM | POA: Diagnosis not present

## 2022-03-13 DIAGNOSIS — I6529 Occlusion and stenosis of unspecified carotid artery: Secondary | ICD-10-CM | POA: Diagnosis not present

## 2022-03-16 DIAGNOSIS — H35373 Puckering of macula, bilateral: Secondary | ICD-10-CM | POA: Diagnosis not present

## 2022-03-16 DIAGNOSIS — H353231 Exudative age-related macular degeneration, bilateral, with active choroidal neovascularization: Secondary | ICD-10-CM | POA: Diagnosis not present

## 2022-03-16 DIAGNOSIS — H34232 Retinal artery branch occlusion, left eye: Secondary | ICD-10-CM | POA: Diagnosis not present

## 2022-03-16 DIAGNOSIS — H43813 Vitreous degeneration, bilateral: Secondary | ICD-10-CM | POA: Diagnosis not present

## 2022-04-13 DIAGNOSIS — H353231 Exudative age-related macular degeneration, bilateral, with active choroidal neovascularization: Secondary | ICD-10-CM | POA: Diagnosis not present

## 2022-04-29 ENCOUNTER — Other Ambulatory Visit: Payer: Self-pay

## 2022-04-29 ENCOUNTER — Emergency Department (HOSPITAL_COMMUNITY): Payer: Medicare HMO

## 2022-04-29 ENCOUNTER — Encounter (HOSPITAL_COMMUNITY): Payer: Self-pay | Admitting: Family Medicine

## 2022-04-29 ENCOUNTER — Observation Stay (HOSPITAL_COMMUNITY)
Admission: EM | Admit: 2022-04-29 | Discharge: 2022-05-02 | Disposition: A | Discharge status: Home Health | Payer: Medicare HMO | Attending: Internal Medicine | Admitting: Internal Medicine

## 2022-04-29 DIAGNOSIS — Z79899 Other long term (current) drug therapy: Secondary | ICD-10-CM | POA: Insufficient documentation

## 2022-04-29 DIAGNOSIS — N1831 Chronic kidney disease, stage 3a: Secondary | ICD-10-CM | POA: Diagnosis not present

## 2022-04-29 DIAGNOSIS — M4316 Spondylolisthesis, lumbar region: Secondary | ICD-10-CM | POA: Diagnosis not present

## 2022-04-29 DIAGNOSIS — Z8542 Personal history of malignant neoplasm of other parts of uterus: Secondary | ICD-10-CM | POA: Diagnosis not present

## 2022-04-29 DIAGNOSIS — K449 Diaphragmatic hernia without obstruction or gangrene: Secondary | ICD-10-CM | POA: Diagnosis not present

## 2022-04-29 DIAGNOSIS — E538 Deficiency of other specified B group vitamins: Secondary | ICD-10-CM | POA: Diagnosis not present

## 2022-04-29 DIAGNOSIS — R2681 Unsteadiness on feet: Secondary | ICD-10-CM | POA: Insufficient documentation

## 2022-04-29 DIAGNOSIS — K921 Melena: Secondary | ICD-10-CM

## 2022-04-29 DIAGNOSIS — I129 Hypertensive chronic kidney disease with stage 1 through stage 4 chronic kidney disease, or unspecified chronic kidney disease: Secondary | ICD-10-CM | POA: Insufficient documentation

## 2022-04-29 DIAGNOSIS — K254 Chronic or unspecified gastric ulcer with hemorrhage: Principal | ICD-10-CM | POA: Insufficient documentation

## 2022-04-29 DIAGNOSIS — R1013 Epigastric pain: Secondary | ICD-10-CM

## 2022-04-29 DIAGNOSIS — I10 Essential (primary) hypertension: Secondary | ICD-10-CM | POA: Diagnosis present

## 2022-04-29 DIAGNOSIS — N281 Cyst of kidney, acquired: Secondary | ICD-10-CM | POA: Diagnosis not present

## 2022-04-29 DIAGNOSIS — M545 Low back pain, unspecified: Secondary | ICD-10-CM

## 2022-04-29 DIAGNOSIS — K922 Gastrointestinal hemorrhage, unspecified: Secondary | ICD-10-CM | POA: Diagnosis present

## 2022-04-29 DIAGNOSIS — K3189 Other diseases of stomach and duodenum: Secondary | ICD-10-CM | POA: Insufficient documentation

## 2022-04-29 DIAGNOSIS — D62 Acute posthemorrhagic anemia: Secondary | ICD-10-CM | POA: Diagnosis not present

## 2022-04-29 DIAGNOSIS — D539 Nutritional anemia, unspecified: Secondary | ICD-10-CM | POA: Diagnosis present

## 2022-04-29 DIAGNOSIS — N2889 Other specified disorders of kidney and ureter: Secondary | ICD-10-CM | POA: Diagnosis not present

## 2022-04-29 DIAGNOSIS — M5126 Other intervertebral disc displacement, lumbar region: Secondary | ICD-10-CM | POA: Diagnosis not present

## 2022-04-29 LAB — COMPREHENSIVE METABOLIC PANEL
ALT: 14 U/L (ref 0–44)
AST: 24 U/L (ref 15–41)
Albumin: 3.9 g/dL (ref 3.5–5.0)
Alkaline Phosphatase: 50 U/L (ref 38–126)
Anion gap: 9 (ref 5–15)
BUN: 48 mg/dL — ABNORMAL HIGH (ref 8–23)
CO2: 25 mmol/L (ref 22–32)
Calcium: 9.8 mg/dL (ref 8.9–10.3)
Chloride: 112 mmol/L — ABNORMAL HIGH (ref 98–111)
Creatinine, Ser: 1.08 mg/dL — ABNORMAL HIGH (ref 0.44–1.00)
GFR, Estimated: 47 mL/min — ABNORMAL LOW (ref >60)
Glucose, Bld: 127 mg/dL — ABNORMAL HIGH (ref 70–99)
Potassium: 5.3 mmol/L — ABNORMAL HIGH (ref 3.5–5.1)
Sodium: 146 mmol/L — ABNORMAL HIGH (ref 135–145)
Total Bilirubin: 1 mg/dL (ref 0.3–1.2)
Total Protein: 6.5 g/dL (ref 6.5–8.1)

## 2022-04-29 LAB — CBC WITH DIFFERENTIAL/PLATELET
Abs Immature Granulocytes: 0.04 10*3/uL (ref 0.00–0.07)
Basophils Absolute: 0 10*3/uL (ref 0.0–0.1)
Basophils Relative: 0 %
Eosinophils Absolute: 0.1 10*3/uL (ref 0.0–0.5)
Eosinophils Relative: 1 %
HCT: 27.8 % — ABNORMAL LOW (ref 36.0–46.0)
Hemoglobin: 8.5 g/dL — ABNORMAL LOW (ref 12.0–15.0)
Immature Granulocytes: 0 %
Lymphocytes Relative: 28 %
Lymphs Abs: 2.9 10*3/uL (ref 0.7–4.0)
MCH: 30.9 pg (ref 26.0–34.0)
MCHC: 30.6 g/dL (ref 30.0–36.0)
MCV: 101.1 fL — ABNORMAL HIGH (ref 80.0–100.0)
Monocytes Absolute: 0.9 10*3/uL (ref 0.1–1.0)
Monocytes Relative: 8 %
Neutro Abs: 6.4 10*3/uL (ref 1.7–7.7)
Neutrophils Relative %: 63 %
Platelets: UNDETERMINED 10*3/uL (ref 150–400)
RBC: 2.75 MIL/uL — ABNORMAL LOW (ref 3.87–5.11)
RDW: 16.8 % — ABNORMAL HIGH (ref 11.5–15.5)
WBC: 10.3 10*3/uL (ref 4.0–10.5)
nRBC: 0.2 % (ref 0.0–0.2)

## 2022-04-29 LAB — RETICULOCYTES
Immature Retic Fract: 37.7 % — ABNORMAL HIGH (ref 2.3–15.9)
RBC.: 2.42 MIL/uL — ABNORMAL LOW (ref 3.87–5.11)
Retic Count, Absolute: 125.8 10*3/uL (ref 19.0–186.0)
Retic Ct Pct: 5.2 % — ABNORMAL HIGH (ref 0.4–3.1)

## 2022-04-29 LAB — URINALYSIS, ROUTINE W REFLEX MICROSCOPIC
Bacteria, UA: NONE SEEN
Bilirubin Urine: NEGATIVE
Glucose, UA: NEGATIVE mg/dL
Hgb urine dipstick: NEGATIVE
Ketones, ur: NEGATIVE mg/dL
Nitrite: NEGATIVE
Protein, ur: 30 mg/dL — AB
Specific Gravity, Urine: 1.019 (ref 1.005–1.030)
pH: 5 (ref 5.0–8.0)

## 2022-04-29 LAB — CBC
HCT: 24.1 % — ABNORMAL LOW (ref 36.0–46.0)
Hemoglobin: 7.5 g/dL — ABNORMAL LOW (ref 12.0–15.0)
MCH: 31.3 pg (ref 26.0–34.0)
MCHC: 31.1 g/dL (ref 30.0–36.0)
MCV: 100.4 fL — ABNORMAL HIGH (ref 80.0–100.0)
Platelets: 242 10*3/uL (ref 150–400)
RBC: 2.4 MIL/uL — ABNORMAL LOW (ref 3.87–5.11)
RDW: 16.5 % — ABNORMAL HIGH (ref 11.5–15.5)
WBC: 11.7 10*3/uL — ABNORMAL HIGH (ref 4.0–10.5)
nRBC: 0 % (ref 0.0–0.2)

## 2022-04-29 LAB — POC OCCULT BLOOD, ED: Fecal Occult Bld: POSITIVE — AB

## 2022-04-29 LAB — LIPASE, BLOOD: Lipase: 53 U/L — ABNORMAL HIGH (ref 11–51)

## 2022-04-29 MED ORDER — SODIUM CHLORIDE 0.9% FLUSH
3.0000 mL | Freq: Two times a day (BID) | INTRAVENOUS | Status: DC
  Administered 2022-04-29 – 2022-05-02 (×6): 3 mL via INTRAVENOUS

## 2022-04-29 MED ORDER — PANTOPRAZOLE SODIUM 40 MG IV SOLR
40.0000 mg | Freq: Two times a day (BID) | INTRAVENOUS | Status: DC
  Administered 2022-04-30 – 2022-05-02 (×5): 40 mg via INTRAVENOUS
  Filled 2022-04-29 (×5): qty 10

## 2022-04-29 MED ORDER — PANTOPRAZOLE 80MG IVPB - SIMPLE MED
80.0000 mg | Freq: Once | INTRAVENOUS | Status: AC
  Administered 2022-04-29: 80 mg via INTRAVENOUS
  Filled 2022-04-29: qty 80

## 2022-04-29 MED ORDER — IOHEXOL 300 MG/ML  SOLN
80.0000 mL | Freq: Once | INTRAMUSCULAR | Status: AC | PRN
  Administered 2022-04-29: 80 mL via INTRAVENOUS

## 2022-04-29 MED ORDER — SODIUM CHLORIDE 0.9 % IV BOLUS
1000.0000 mL | Freq: Once | INTRAVENOUS | Status: AC
  Administered 2022-04-29: 1000 mL via INTRAVENOUS

## 2022-04-29 MED ORDER — ACETAMINOPHEN 325 MG PO TABS
650.0000 mg | ORAL_TABLET | Freq: Once | ORAL | Status: AC
  Administered 2022-04-29: 650 mg via ORAL
  Filled 2022-04-29: qty 2

## 2022-04-29 MED ORDER — ONDANSETRON HCL 4 MG/2ML IJ SOLN: 4.0000 mg | Freq: Four times a day (QID) | INTRAMUSCULAR | Status: DC | PRN

## 2022-04-29 MED ORDER — ACETAMINOPHEN 325 MG PO TABS: 650.0000 mg | ORAL_TABLET | Freq: Four times a day (QID) | ORAL | Status: DC | PRN

## 2022-04-29 MED ORDER — ACETAMINOPHEN 650 MG RE SUPP: 650.0000 mg | Freq: Four times a day (QID) | RECTAL | Status: DC | PRN

## 2022-04-29 MED ORDER — ONDANSETRON HCL 4 MG PO TABS: 4.0000 mg | ORAL_TABLET | Freq: Four times a day (QID) | ORAL | Status: DC | PRN

## 2022-04-29 NOTE — ED Provider Notes (Signed)
[facility] DEPT Provider Note   CSN: [id number] Arrival date & time: 04/29/22  1237     History  Chief Complaint  Patient presents with   Abdominal Pain    Brianna Bush is a 88 y.o. female with a past medical history significant for GERD, hypertension, hyperlipidemia, endometrial carcinoma, BS, and Gilbert's disease who presents to the ED due to low back pain.  Patient states 4 days ago she was lifting a Christmas tree from her front porch and began having worsening of her chronic low back pain.  Denies lower extremity weakness or numbness/tingling.  Daughters at bedside states patient was having abdominal pain earlier today with decreased p.o. intake for the past 3 days.  Daughters note that patient had routine labs in October which were unremarkable.  Normal hemoglobin per daughters.  No history of GI bleed. No history of PUD   History obtained from patient, daughters, and past medical records. No interpreter used during encounter.      Home Medications Prior to Admission medications   Medication Sig Start Date End Date Taking? Authorizing Provider  amLODipine (NORVASC) 5 MG tablet Take 5 mg by mouth daily.    [provider]  ciprofloxacin (CIPRO) 500 MG tablet Take 1 tablet (500 mg total) by mouth 2 (two) times daily. 10/12/15   Ghimire, Henreitta Leber, MD  hydrochlorothiazide (MICROZIDE) 12.5 MG capsule Take 12.5 mg by mouth daily.    [provider]  ibuprofen (ADVIL,MOTRIN) 200 MG tablet Take 200 mg by mouth every 6 (six) hours as needed.    [provider]  metroNIDAZOLE (FLAGYL) 500 MG tablet Take 1 tablet (500 mg total) by mouth 3 (three) times daily. 10/12/15   Ghimire, Henreitta Leber, MD  predniSONE (DELTASONE) 10 MG tablet Take 10 mg by mouth daily as needed (inflammation).     [provider]  raloxifene (EVISTA) 60 MG tablet Take 60 mg by mouth daily.    [provider]  simvastatin (ZOCOR) 10 MG  tablet Take 10 mg by mouth daily.    [provider]      Allergies    Tenoretic [atenolol-chlorthalidone]    Review of Systems   Review of Systems  Respiratory:  Negative for shortness of breath.   Cardiovascular:  Negative for chest pain.  Gastrointestinal:  Positive for abdominal pain and nausea. Negative for diarrhea and vomiting.  Genitourinary:  Negative for dysuria.    Physical Exam Updated Vital Signs BP (!) 100/56   Pulse 89   Temp 98.2 F (36.8 C) (Oral)   Resp 16   Ht '5\' 2"'$  (1.575 m)   Wt 68 kg   SpO2 100%   BMI 27.42 kg/m  Physical Exam Vitals and nursing note reviewed.  Constitutional:      General: She is not in acute distress.    Appearance: She is not ill-appearing.  HENT:     Head: Normocephalic.  Eyes:     Pupils: Pupils are equal, round, and reactive to light.  Cardiovascular:     Rate and Rhythm: Normal rate and regular rhythm.     Pulses: Normal pulses.     Heart sounds: Normal heart sounds. No murmur heard.    No friction rub. No gallop.  Pulmonary:     Effort: Pulmonary effort is normal.     Breath sounds: Normal breath sounds.  Abdominal:     General: Abdomen is flat. There is no distension.     Palpations: Abdomen is  soft.     Tenderness: There is abdominal tenderness. There is no guarding or rebound.     Comments: Diffuse tenderness.  No focal tenderness.  Genitourinary:    Comments: Rectal exam performed with chaperone in room.  Numerous external hemorrhoids. Dark tarry stool.  Musculoskeletal:        General: Normal range of motion.     Cervical back: Neck supple.  Skin:    General: Skin is warm and dry.  Neurological:     General: No focal deficit present.     Mental Status: She is alert.  Psychiatric:        Mood and Affect: Mood normal.        Behavior: Behavior normal.     ED Results / Procedures / Treatments   Labs (all labs ordered are listed, but only abnormal results are displayed) Labs Reviewed  CBC WITH  DIFFERENTIAL/PLATELET - Abnormal; Notable for the following components:      Result Value   RBC 2.75 (*)    Hemoglobin 8.5 (*)    HCT 27.8 (*)    MCV 101.1 (*)    RDW 16.8 (*)    All other components within normal limits  COMPREHENSIVE METABOLIC PANEL - Abnormal; Notable for the following components:   Sodium 146 (*)    Potassium 5.3 (*)    Chloride 112 (*)    Glucose, Bld 127 (*)    BUN 48 (*)    Creatinine, Ser 1.08 (*)    GFR, Estimated 47 (*)    All other components within normal limits  LIPASE, BLOOD - Abnormal; Notable for the following components:   Lipase 53 (*)    All other components within normal limits  URINALYSIS, ROUTINE W REFLEX MICROSCOPIC - Abnormal; Notable for the following components:   APPearance HAZY (*)    Protein, ur 30 (*)    Leukocytes,Ua TRACE (*)    All other components within normal limits  POC OCCULT BLOOD, ED - Abnormal; Notable for the following components:   Fecal Occult Bld POSITIVE (*)    All other components within normal limits    EKG EKG Interpretation  Date/Time:  Wednesday April 29 2022 20:39:51 EST Ventricular Rate:  71 PR Interval:  158 QRS Duration: 86 QT Interval:  432 QTC Calculation: 469 R Axis:   255 Text Interpretation: Normal sinus rhythm Low voltage QRS Possible Lateral infarct , age undetermined Abnormal ECG No significant change since last tracing Confirmed by Deno Etienne [id number]) on 04/29/2022 9:16:34 PM  Radiology US RENAL  Result Date: 04/29/2022 CLINICAL DATA:  Follow-up indeterminate renal lesions on same-day CT abdomen and pelvis EXAM: RENAL / URINARY TRACT ULTRASOUND COMPLETE COMPARISON:  CT 04/29/2022 FINDINGS: Right Kidney: Renal measurements: 9.5 x 3.7 x 4.4 cm = volume: 81 mL. Two anechoic cysts in the posterior right kidney measure 1.5 and 1.2 cm respectively. No internal vascularity. A third cyst in the right kidney also is anechoic without internal vascularity and measures 1.6 cm. No hydronephrosis.  Normal cortical echogenicity. Left Kidney: Renal measurements: 9.1 x 4.9 x 4.7 cm = volume: 110 mL. Anechoic cysts without internal vascularity in the left kidney measure 2.0 cm and 0.9 cm, respectively. Normal cortical echogenicity. No hydronephrosis. Bladder: Appears normal for degree of bladder distention. Other: None. IMPRESSION: Indeterminate lesion seen on same day CT abdomen and pelvis are compatible with benign simple cysts given ultrasound characteristics. No additional follow-up is recommended. Electronically Signed   By: Carroll Kinds.D.  On: 04/29/2022 21:26   CT L-SPINE NO CHARGE  Result Date: 04/29/2022 CLINICAL DATA:  Initial evaluation for acute abdominal pain, nausea, vomiting. EXAM: CT LUMBAR SPINE WITHOUT CONTRAST TECHNIQUE: Multidetector CT imaging of the lumbar spine was performed without intravenous contrast administration. Multiplanar CT image reconstructions were also generated. RADIATION DOSE REDUCTION: This exam was performed according to the departmental dose-optimization program which includes automated exposure control, adjustment of the mA and/or kV according to patient size and/or use of iterative reconstruction technique. COMPARISON:  Comparison made with prior radiograph from 06/13/2022 as well as previous. FINDINGS: Segmentation: Standard. Lowest well-formed disc space labeled the L5-S1 level. Alignment: Moderate dextroscoliosis, apex at L1-2. Slight rotatory component again noted. 4 mm retrolisthesis of L1 on L2, 5 mm retrolisthesis of L2 on L3, 5 mm anterolisthesis of L4 on L5 and L5 on S1. Findings chronic and degenerative. Vertebrae: Subtle concavity at the superior endplate of L5 with subchondral gas lucency, age indeterminate, and could reflect a subtle acute to subacute compression fracture. Otherwise, vertebral body height relatively well maintained with no other acute or recent fracture. Visualized sacrum and pelvis intact. No worrisome osseous lesions. Paraspinal  and other soft tissues: Paraspinous soft tissues demonstrate no acute finding. Cardiomegaly noted. Advanced aorto bi-iliac atherosclerotic disease. Moderate retained stool noted within the distal colon/rectum, suggesting constipation. Intermediate density right renal lesions again noted, indeterminate. Disc levels: L1-2: Retrolisthesis. Degenerative intervertebral disc space narrowing with diffuse disc bulge and disc desiccation. Reactive endplate spurring. Mild facet hypertrophy. No significant canal stenosis. Foramina remain patent. L2-3: Retrolisthesis with advanced degenerative intervertebral disc space narrowing. Diffuse disc bulge with reactive endplate spurring, worse on the left. Bilateral facet hypertrophy. No spinal stenosis. Moderate left L2 foraminal narrowing. Right neural foramen remains patent. L3-4: Degenerative disc space narrowing with diffuse disc bulge and disc desiccation. Reactive endplate spurring. Moderate facet hypertrophy. Resultant mild canal with moderate bilateral subarticular stenosis. Moderate right with mild left L3 foraminal narrowing. L4-5: Anterolisthesis. Disc bulge with disc desiccation. Superimposed right foraminal disc protrusion closely approximates the exiting right L4 nerve root. Moderate facet arthrosis. Resultant mild left with moderate right lateral recess stenosis. Mild left with moderate right L4 foraminal narrowing. L5-S1: Anterolisthesis. Disc bulge with disc desiccation. Reactive endplate spurring. Moderate to advanced facet arthrosis. No significant spinal stenosis. Moderate right with mild left L5 foraminal narrowing. IMPRESSION: 1. Subtle concavity at the superior endplate of L5, age indeterminate, but could reflect a subtle acute to subacute compression fracture. Correlation with physical exam for possible pain at this location recommended. 2. No other acute osseous abnormality within the lumbar spine. 3. Moderate dextroscoliosis with advanced multilevel  degenerative spondylosis and facet arthrosis as above. Resultant mild canal with moderate bilateral subarticular stenosis at L3-4. 4. Right foraminal disc protrusion at L4-5, potentially affecting the right L4 nerve root. 5. Intermediate density lesions within the right kidney, indeterminate. While these findings could reflect proteinaceous and/or hemorrhagic cyst, possible renal neoplasms could also have this appearance. Further evaluation with dedicated renal mass protocol CT and/or MRI suggested for further evaluation. Aortic Atherosclerosis (ICD10-I70.0). Electronically Signed   By: Jeannine Boga M.D.   On: 04/29/2022 20:16   CT ABDOMEN PELVIS W CONTRAST  Result Date: 04/29/2022 CLINICAL DATA:  Acute nonlocalized abdominal pain with nausea and weakness for 3 days. EXAM: CT ABDOMEN AND PELVIS WITH CONTRAST TECHNIQUE: Multidetector CT imaging of the abdomen and pelvis was performed using the standard protocol following bolus administration of intravenous contrast. RADIATION DOSE REDUCTION: This exam was performed according  to the departmental dose-optimization program which includes automated exposure control, adjustment of the mA and/or kV according to patient size and/or use of iterative reconstruction technique. CONTRAST:  6m OMNIPAQUE IOHEXOL 300 MG/ML  SOLN COMPARISON:  October 10, 2015. FINDINGS: Lower chest: Basilar atelectasis and septal thickening. Mild bronchiectatic changes at the LEFT lung base. Cardiomegaly. Coronary artery disease calcification of RIGHT coronary circulation. No chest wall abnormality. Hepatobiliary: Lobular hepatic contours. No focal, suspicious hepatic lesion or pericholecystic stranding. No biliary duct dilation. Elongated RIGHT hepatic lobe may represent RIGHT else lobe. Pancreas: Normal, without mass, inflammation or ductal dilatation. Spleen: Normal. Adrenals/Urinary Tract: Adrenal glands are normal. No hydronephrosis or perinephric stranding. Cyst arises from the  lower pole the LEFT kidney measuring water density and 1.8 cm. Along the posterior hilar lip of the LEFT kidney is a 69 Hounsfield unit 1.1 cm lesion that is well-circumscribed and slightly enlarged where it measured 9 mm on the prior study. Intermediate density lesions arising from the lateral cortex of the RIGHT kidney also appears slightly increased in size, largest measuring 15 mm (image 12/2 with a density of 70 Hounsfield units previously displaying a density of 56 Hounsfield units on noncontrast imaging. There is an increase in number of intermediate density lesions along the lateral cortex of the RIGHT kidney since previous imaging approximately 4 additional lesions are seen on images 51 and 55 of series 4 No definite ureteral calculi or hydronephrosis. Urinary bladder is collapsed limiting assessment. There are phleboliths in the pelvis which are similar to prior imaging. Stomach/Bowel: Stomach is under distended limiting evaluation. Question antral thickening and distal body thickening and irregularity which persists from initial to the late phase. Small bowel without dilation or signs of inflammation. Colon without dilation or inflammation stool in the rectum only small volume. Appendix not visualized but no secondary signs that would suggest acute appendicitis Vascular/Lymphatic: Aortic atherosclerosis. No sign of aneurysm. Smooth contour of the IVC. There is no gastrohepatic or hepatoduodenal ligament lymphadenopathy. No retroperitoneal or mesenteric lymphadenopathy. No pelvic sidewall lymphadenopathy. Atherosclerosis is moderate to marked. Reproductive: Post hysterectomy. Other: No ascites.  No pneumoperitoneum. Musculoskeletal: Rotary dextroconvex scoliotic curvature centered about the L1 vertebral level similar to previous imaging. No acute musculoskeletal findings. Retrolisthesis of L1 on L2 and L2 on L3 is moderate and similar to prior imaging as well. IMPRESSION: 1. Question of antral thickening  and distal gastric body thickening and irregularity which persists from initial to the late phase. Correlate with any symptoms of gastritis or peptic ulcer disease. 2. Intermediate density lesions arising from the lateral cortex of the RIGHT kidney also appears slightly increased in size since previous imaging. These may represent hemorrhagic or proteinaceous cysts but are incompletely characterized not allowing for exclusion of small solid renal neoplasms. Consider follow-up renal sonogram for further evaluation. 3. Cardiomegaly with coronary artery disease calcification of RIGHT coronary circulation. 4. Aortic atherosclerosis. 5. Rotary dextroconvex scoliotic curvature centered about the L1 vertebral level similar to previous imaging. Aortic Atherosclerosis (ICD10-I70.0). Electronically Signed   By: GZetta BillsM.D.   On: 04/29/2022 18:40    Procedures Procedures    Medications Ordered in ED Medications  acetaminophen (TYLENOL) tablet 650 mg (has no administration in time range)  pantoprazole (PROTONIX) 80 mg /NS 100 mL IVPB (has no administration in time range)  iohexol (OMNIPAQUE) 300 MG/ML solution 80 mL (80 mLs Intravenous Contrast Given 04/29/22 1744)    ED Course/ Medical Decision Making/ A&P  Medical Decision Making Amount and/or Complexity of Data Reviewed Independent Historian: caregiver    Details: Daughters at bedside provided history Labs: ordered. Decision-making details documented in ED Course. Radiology: ordered and independent interpretation performed. Decision-making details documented in ED Course. ECG/medicine tests: ordered and independent interpretation performed. Decision-making details documented in ED Course.  Risk OTC drugs. Decision regarding hospitalization.   This patient presents to the ED for concern of abdominal and back pain, this involves an extensive number of treatment options, and is a complaint that carries with it a high  risk of complications and morbidity.  The differential diagnosis includes bony fracture, bowel obstruction, GI bleed, pancreatitis, PUD, UTI, etc  88 year old female presents to the ED due to low back pain that started a few days ago after lifting a Christmas tree off her front porch.  No direct injury.  Denies saddle anesthesia, bowel/bladder incontinence, lower extremity numbness/[place], lower extremity weakness.  She admits to chronic low back pain after a fall roughly 10 years ago.  Daughter is at bedside states that patient has also been complaining of abdominal pain with decreased p.o. intake over the past 3 days.  Upon arrival, stable vitals.  Patient in no acute distress.  Unfortunately, patient waited over 7 hours prior to my initial evaluation due to long wait times.  Abdomen soft, nondistended with diffuse tenderness.  No focal tenderness.  Some midline tenderness in the lower lumbar region.  Bilateral lower extremities neurovascularly intact.  Routine labs ordered at triage.  CT abdomen L-spine ordered.  Tylenol given.  CBC significant for anemia with hemoglobin 8.5.  No records in EMR except from 6 years ago with normal hemoglobin.  Daughters at bedside states that patient had routine labs drawn in October which were normal.  Patient denies any melena or hematochezia. No previous GI bleed.  CMP significant for mild hyponatremia 146 and hyperkalemia 5.3.  Normal LFTs.  Lipase mildly elevated at 53.  UA negative for signs of infection.  CT abdomen personally reviewed and interpreted which demonstrates thickening around the gastric body likely due to gastritis or peptic ulcer disease.  Denies history of PUD.  Also demonstrates lesions on right kidney.  Ultrasound showed simple cyst of right kidney.  CT lumbar spine demonstrated subacute fracture of endplate of L5. EKG NSR, no signs of acute ischemia. Low suspicion for atypical ACS.  Rectal exam performed with chaperone in room.  Numerous external  hemorrhoids.  Melanotic stool.  Fecal occult positive.  Suspect upper GI bleed given significant drop in hemoglobin since October.  IV Protonix started.  Will consult hospitalist for admission. Discussed with Dr. Tyrone Nine who evaluated patient at bedside and agrees with assessment and plan.  10:00 PM Discussed with Dr. Myna Hidalgo with [facility] who agrees to admit patient. Secure chart sent to Dr. Watt Climes with [facility] to inform them of patient.        Final Clinical Impression(s) / ED Diagnoses Final diagnoses:  Epigastric pain  Acute midline low back pain without sciatica  Gastrointestinal hemorrhage with melena    Rx / DC Orders ED Discharge Orders     None         Karie Kirks 04/29/22 [place], DO 04/29/22 2239

## 2022-04-29 NOTE — ED Triage Notes (Signed)
Abd pain, nausea and weakness x 3 days

## 2022-04-29 NOTE — ED Provider Triage Note (Signed)
Emergency Medicine Provider Triage Evaluation Note  Brianna Bush , a 88 y.o. female  was evaluated in triage.  Pt complains of low back pain, and abdominal pain.  Back pain started a couple days before Christmas when she tried to move a downed tree to make room for the mailman.  Abdominal pain started on day of Christmas and has progressively worsened since then.  Decreased p.o. intake.  Denies nausea, vomiting, fever, dysuria.  Denies paresthesias.  Review of Systems  Positive: As above Negative: As above  Physical Exam  BP (!) 100/56   Pulse 89   Temp 98.2 F (36.8 C) (Oral)   Resp 16   SpO2 100%  Gen:   Awake, no distress   Resp:  Normal effort  MSK:   Moves extremities without difficulty  Other:  Generalized abdominal tenderness without guarding.  Normoactive bowel sounds  Medical Decision Making  Medically screening exam initiated at 3:54 PM.  Appropriate orders placed.  Brianna Bush was informed that the remainder of the evaluation will be completed by another provider, this initial triage assessment does not replace that evaluation, and the importance of remaining in the ED until their evaluation is complete.    Evlyn Courier, PA-C 04/29/22 1555

## 2022-04-29 NOTE — H&P (Signed)
History and Physical    Brianna Bush YQM:[id number] DOB: 01/18/1924 DOA: 04/29/2022  PCP: Antony Blackbird, MD   Patient coming from: Home   Chief Complaint: Back pain, abdominal pain, nausea, weakness   HPI: Brianna Bush is a pleasant 88 y.o. female with medical history significant for hypertension, hyperlipidemia, GERD, and CKD 3A who presents emergency department with abdominal pain, back pain, nausea, and general weakness.***   ED Course: Upon arrival to the ED, patient is found to be afebrile and saturating well on room air with systolic blood pressure of 100.  EKG demonstrates sinus rhythm.  CT of the abdomen pelvis raises question of antral and distal gastric body thickening.  Blood work notable for sodium 146, potassium 5.3, BUN 48, creatinine 1.08, hemoglobin 8.5, MCV 101.1, and clumped platelets.  Fecal occult blood testing is positive.  Stool appeared melanotic per ED PA.  Patient was given 1 L of normal saline, 80 mg IV Protonix, and acetaminophen in the ED.  ED PA sent a message to [facility] with request for routine consultation.  Review of Systems:  All other systems reviewed and apart from HPI, are negative.  Past Medical History:  Diagnosis Date   Acute sinusitis, unspecified    Diverticulosis    Endometrial carcinoma (HCC)    GERD (gastroesophageal reflux disease)    Gilbert disease    Hyperlipidemia    Hypertension    IBS (irritable bowel syndrome)    Osteopenia    Vitamin D deficiency     History reviewed. No pertinent surgical history.  Social History:   has no history on file for tobacco use, alcohol use, and drug use.  Allergies  Allergen Reactions   Tenoretic [Atenolol-Chlorthalidone]     bradycardia    History reviewed. No pertinent family history.   Prior to Admission medications   Medication Sig Start Date End Date Taking? Authorizing Provider  amLODipine (NORVASC) 5 MG tablet Take 5 mg by mouth daily.    [provider]   ciprofloxacin (CIPRO) 500 MG tablet Take 1 tablet (500 mg total) by mouth 2 (two) times daily. 10/12/15   Ghimire, Henreitta Leber, MD  hydrochlorothiazide (MICROZIDE) 12.5 MG capsule Take 12.5 mg by mouth daily.    [provider]  ibuprofen (ADVIL,MOTRIN) 200 MG tablet Take 200 mg by mouth every 6 (six) hours as needed.    [provider]  metroNIDAZOLE (FLAGYL) 500 MG tablet Take 1 tablet (500 mg total) by mouth 3 (three) times daily. 10/12/15   Ghimire, Henreitta Leber, MD  predniSONE (DELTASONE) 10 MG tablet Take 10 mg by mouth daily as needed (inflammation).     [provider]  raloxifene (EVISTA) 60 MG tablet Take 60 mg by mouth daily.    [provider]  simvastatin (ZOCOR) 10 MG tablet Take 10 mg by mouth daily.    [provider]    Physical Exam: Vitals:   04/29/22 1251 04/29/22 1559  BP: (!) 100/56   Pulse: 89   Resp: 16   Temp: 98.2 F (36.8 C)   TempSrc: Oral   SpO2: 100%   Weight:  68 kg  Height:  '5\' 2"'$  (1.575 m)     Constitutional: NAD, calm  Eyes: PERTLA, lids and conjunctivae normal ENMT: Mucous membranes are moist. Posterior pharynx clear of any exudate or lesions.   Neck: supple, no masses  Respiratory: clear to auscultation bilaterally, no wheezing, no crackles. No accessory muscle use.  Cardiovascular: S1 & S2 heard, regular  rate and rhythm. No extremity edema. No significant JVD. Abdomen: No distension, no tenderness, soft. Bowel sounds active.  Musculoskeletal: no clubbing / cyanosis. No joint deformity upper and lower extremities.   Skin: no significant rashes, lesions, ulcers. Warm, dry, well-perfused. Neurologic: CN 2-12 grossly intact. Sensation intact, DTR normal. Strength 5/5 in all 4 limbs. Alert and oriented.  Psychiatric: Pleasant. Cooperative.    Labs and Imaging on Admission: I have personally reviewed following labs and imaging studies  CBC: Recent Labs  Lab 04/29/22 1605  WBC 10.3  NEUTROABS 6.4  HGB  8.5*  HCT 27.8*  MCV 101.1*  PLT PLATELET CLUMPS NOTED ON SMEAR, UNABLE TO ESTIMATE   Basic Metabolic Panel: Recent Labs  Lab 04/29/22 1605  NA 146*  K 5.3*  CL 112*  CO2 25  GLUCOSE 127*  BUN 48*  CREATININE 1.08*  CALCIUM 9.8   GFR: Estimated Creatinine Clearance: 27.6 mL/min (A) (by C-G formula based on SCr of 1.08 mg/dL (H)). Liver Function Tests: Recent Labs  Lab 04/29/22 1605  AST 24  ALT 14  ALKPHOS 50  BILITOT 1.0  PROT 6.5  ALBUMIN 3.9   Recent Labs  Lab 04/29/22 1605  LIPASE 53*   No results for input(s): "AMMONIA" in the last 168 hours. Coagulation Profile: No results for input(s): "INR", "PROTIME" in the last 168 hours. Cardiac Enzymes: No results for input(s): "CKTOTAL", "CKMB", "CKMBINDEX", "TROPONINI" in the last 168 hours. BNP (last 3 results) No results for input(s): "PROBNP" in the last 8760 hours. HbA1C: No results for input(s): "HGBA1C" in the last 72 hours. CBG: No results for input(s): "GLUCAP" in the last 168 hours. Lipid Profile: No results for input(s): "CHOL", "HDL", "LDLCALC", "TRIG", "CHOLHDL", "LDLDIRECT" in the last 72 hours. Thyroid Function Tests: No results for input(s): "TSH", "T4TOTAL", "FREET4", "T3FREE", "THYROIDAB" in the last 72 hours. Anemia Panel: No results for input(s): "VITAMINB12", "FOLATE", "FERRITIN", "TIBC", "IRON", "RETICCTPCT" in the last 72 hours. Urine analysis:    Component Value Date/Time   COLORURINE YELLOW 04/29/2022 1553   APPEARANCEUR HAZY (A) 04/29/2022 1553   LABSPEC 1.019 04/29/2022 1553   PHURINE 5.0 04/29/2022 1553   GLUCOSEU NEGATIVE 04/29/2022 1553   HGBUR NEGATIVE 04/29/2022 1553   BILIRUBINUR NEGATIVE 04/29/2022 1553   KETONESUR NEGATIVE 04/29/2022 1553   PROTEINUR 30 (A) 04/29/2022 1553   NITRITE NEGATIVE 04/29/2022 1553   LEUKOCYTESUR TRACE (A) 04/29/2022 1553   Sepsis Labs: '@LABRCNTIP'$ (procalcitonin:4,lacticidven:4) )No results found for this or any previous visit (from the past  240 hour(s)).   Radiological Exams on Admission: US RENAL  Result Date: 04/29/2022 CLINICAL DATA:  Follow-up indeterminate renal lesions on same-day CT abdomen and pelvis EXAM: RENAL / URINARY TRACT ULTRASOUND COMPLETE COMPARISON:  CT 04/29/2022 FINDINGS: Right Kidney: Renal measurements: 9.5 x 3.7 x 4.4 cm = volume: 81 mL. Two anechoic cysts in the posterior right kidney measure 1.5 and 1.2 cm respectively. No internal vascularity. A third cyst in the right kidney also is anechoic without internal vascularity and measures 1.6 cm. No hydronephrosis. Normal cortical echogenicity. Left Kidney: Renal measurements: 9.1 x 4.9 x 4.7 cm = volume: 110 mL. Anechoic cysts without internal vascularity in the left kidney measure 2.0 cm and 0.9 cm, respectively. Normal cortical echogenicity. No hydronephrosis. Bladder: Appears normal for degree of bladder distention. Other: None. IMPRESSION: Indeterminate lesion seen on same day CT abdomen and pelvis are compatible with benign simple cysts given ultrasound characteristics. No additional follow-up is recommended. Electronically Signed   By: Carroll Kinds.D.  On: 04/29/2022 21:26   CT L-SPINE NO CHARGE  Result Date: 04/29/2022 CLINICAL DATA:  Initial evaluation for acute abdominal pain, nausea, vomiting. EXAM: CT LUMBAR SPINE WITHOUT CONTRAST TECHNIQUE: Multidetector CT imaging of the lumbar spine was performed without intravenous contrast administration. Multiplanar CT image reconstructions were also generated. RADIATION DOSE REDUCTION: This exam was performed according to the departmental dose-optimization program which includes automated exposure control, adjustment of the mA and/or kV according to patient size and/or use of iterative reconstruction technique. COMPARISON:  Comparison made with prior radiograph from 06/13/2022 as well as previous. FINDINGS: Segmentation: Standard. Lowest well-formed disc space labeled the L5-S1 level. Alignment: Moderate  dextroscoliosis, apex at L1-2. Slight rotatory component again noted. 4 mm retrolisthesis of L1 on L2, 5 mm retrolisthesis of L2 on L3, 5 mm anterolisthesis of L4 on L5 and L5 on S1. Findings chronic and degenerative. Vertebrae: Subtle concavity at the superior endplate of L5 with subchondral gas lucency, age indeterminate, and could reflect a subtle acute to subacute compression fracture. Otherwise, vertebral body height relatively well maintained with no other acute or recent fracture. Visualized sacrum and pelvis intact. No worrisome osseous lesions. Paraspinal and other soft tissues: Paraspinous soft tissues demonstrate no acute finding. Cardiomegaly noted. Advanced aorto bi-iliac atherosclerotic disease. Moderate retained stool noted within the distal colon/rectum, suggesting constipation. Intermediate density right renal lesions again noted, indeterminate. Disc levels: L1-2: Retrolisthesis. Degenerative intervertebral disc space narrowing with diffuse disc bulge and disc desiccation. Reactive endplate spurring. Mild facet hypertrophy. No significant canal stenosis. Foramina remain patent. L2-3: Retrolisthesis with advanced degenerative intervertebral disc space narrowing. Diffuse disc bulge with reactive endplate spurring, worse on the left. Bilateral facet hypertrophy. No spinal stenosis. Moderate left L2 foraminal narrowing. Right neural foramen remains patent. L3-4: Degenerative disc space narrowing with diffuse disc bulge and disc desiccation. Reactive endplate spurring. Moderate facet hypertrophy. Resultant mild canal with moderate bilateral subarticular stenosis. Moderate right with mild left L3 foraminal narrowing. L4-5: Anterolisthesis. Disc bulge with disc desiccation. Superimposed right foraminal disc protrusion closely approximates the exiting right L4 nerve root. Moderate facet arthrosis. Resultant mild left with moderate right lateral recess stenosis. Mild left with moderate right L4 foraminal  narrowing. L5-S1: Anterolisthesis. Disc bulge with disc desiccation. Reactive endplate spurring. Moderate to advanced facet arthrosis. No significant spinal stenosis. Moderate right with mild left L5 foraminal narrowing. IMPRESSION: 1. Subtle concavity at the superior endplate of L5, age indeterminate, but could reflect a subtle acute to subacute compression fracture. Correlation with physical exam for possible pain at this location recommended. 2. No other acute osseous abnormality within the lumbar spine. 3. Moderate dextroscoliosis with advanced multilevel degenerative spondylosis and facet arthrosis as above. Resultant mild canal with moderate bilateral subarticular stenosis at L3-4. 4. Right foraminal disc protrusion at L4-5, potentially affecting the right L4 nerve root. 5. Intermediate density lesions within the right kidney, indeterminate. While these findings could reflect proteinaceous and/or hemorrhagic cyst, possible renal neoplasms could also have this appearance. Further evaluation with dedicated renal mass protocol CT and/or MRI suggested for further evaluation. Aortic Atherosclerosis (ICD10-I70.0). Electronically Signed   By: Jeannine Boga M.D.   On: 04/29/2022 20:16   CT ABDOMEN PELVIS W CONTRAST  Result Date: 04/29/2022 CLINICAL DATA:  Acute nonlocalized abdominal pain with nausea and weakness for 3 days. EXAM: CT ABDOMEN AND PELVIS WITH CONTRAST TECHNIQUE: Multidetector CT imaging of the abdomen and pelvis was performed using the standard protocol following bolus administration of intravenous contrast. RADIATION DOSE REDUCTION: This exam was performed according  to the departmental dose-optimization program which includes automated exposure control, adjustment of the mA and/or kV according to patient size and/or use of iterative reconstruction technique. CONTRAST:  26m OMNIPAQUE IOHEXOL 300 MG/ML  SOLN COMPARISON:  October 10, 2015. FINDINGS: Lower chest: Basilar atelectasis and septal  thickening. Mild bronchiectatic changes at the LEFT lung base. Cardiomegaly. Coronary artery disease calcification of RIGHT coronary circulation. No chest wall abnormality. Hepatobiliary: Lobular hepatic contours. No focal, suspicious hepatic lesion or pericholecystic stranding. No biliary duct dilation. Elongated RIGHT hepatic lobe may represent RIGHT else lobe. Pancreas: Normal, without mass, inflammation or ductal dilatation. Spleen: Normal. Adrenals/Urinary Tract: Adrenal glands are normal. No hydronephrosis or perinephric stranding. Cyst arises from the lower pole the LEFT kidney measuring water density and 1.8 cm. Along the posterior hilar lip of the LEFT kidney is a 69 Hounsfield unit 1.1 cm lesion that is well-circumscribed and slightly enlarged where it measured 9 mm on the prior study. Intermediate density lesions arising from the lateral cortex of the RIGHT kidney also appears slightly increased in size, largest measuring 15 mm (image 12/2 with a density of 70 Hounsfield units previously displaying a density of 56 Hounsfield units on noncontrast imaging. There is an increase in number of intermediate density lesions along the lateral cortex of the RIGHT kidney since previous imaging approximately 4 additional lesions are seen on images 51 and 55 of series 4 No definite ureteral calculi or hydronephrosis. Urinary bladder is collapsed limiting assessment. There are phleboliths in the pelvis which are similar to prior imaging. Stomach/Bowel: Stomach is under distended limiting evaluation. Question antral thickening and distal body thickening and irregularity which persists from initial to the late phase. Small bowel without dilation or signs of inflammation. Colon without dilation or inflammation stool in the rectum only small volume. Appendix not visualized but no secondary signs that would suggest acute appendicitis Vascular/Lymphatic: Aortic atherosclerosis. No sign of aneurysm. Smooth contour of the IVC.  There is no gastrohepatic or hepatoduodenal ligament lymphadenopathy. No retroperitoneal or mesenteric lymphadenopathy. No pelvic sidewall lymphadenopathy. Atherosclerosis is moderate to marked. Reproductive: Post hysterectomy. Other: No ascites.  No pneumoperitoneum. Musculoskeletal: Rotary dextroconvex scoliotic curvature centered about the L1 vertebral level similar to previous imaging. No acute musculoskeletal findings. Retrolisthesis of L1 on L2 and L2 on L3 is moderate and similar to prior imaging as well. IMPRESSION: 1. Question of antral thickening and distal gastric body thickening and irregularity which persists from initial to the late phase. Correlate with any symptoms of gastritis or peptic ulcer disease. 2. Intermediate density lesions arising from the lateral cortex of the RIGHT kidney also appears slightly increased in size since previous imaging. These may represent hemorrhagic or proteinaceous cysts but are incompletely characterized not allowing for exclusion of small solid renal neoplasms. Consider follow-up renal sonogram for further evaluation. 3. Cardiomegaly with coronary artery disease calcification of RIGHT coronary circulation. 4. Aortic atherosclerosis. 5. Rotary dextroconvex scoliotic curvature centered about the L1 vertebral level similar to previous imaging. Aortic Atherosclerosis (ICD10-I70.0). Electronically Signed   By: GZetta BillsM.D.   On: 04/29/2022 18:40    EKG: Independently reviewed. Sinus rhythm.   Assessment/Plan   1. GI bleeding; macrocytic anemia  - Hgb 8.5 on admission, down from 15.0 in 2017 (pt's daughters report she had unremarkable bloodwork in October 2023)  - ED PA reports melena on DRE, BUN is increased, and there are CT findings suggestive of PUD  - *** NSAID use  - She was given 80 mg IV Protonix in  ED  - Continue NPO, IV PPI, trend H&H, transfuse if needed, follow-up GI recommendations    2. Hypertension  - SBP is 100 in ED, there is concern  for upper GIB, and antihypertensives held on admission   3. CKD IIIa; hyperkalemia; hypernatremia  - SCr is 1.08 on admission, appears close to baseline  - Slight hypernatremia and hyperkalemia noted on admission  - She was given 1 liter NS in ED, will repeat chem panel in am    DVT prophylaxis: SCDs  Code Status: ***  Level of Care: Level of care: Telemetry Family Communication: ***  Disposition Plan:  Patient is from: home  Anticipated d/c is to: Home  Anticipated d/c date is: Possibly as early as 12/28 or 05/01/22  Patient currently: pending GI consultation, stable H&H  Consults called: ED PA sent message to [facility] with request for routine consult  Admission status: Observation     Vianne Bulls, MD [facility]  04/29/2022, 10:19 PM

## 2022-04-29 NOTE — ED Notes (Signed)
Hospitalist at bedside 

## 2022-04-29 NOTE — ED Notes (Signed)
Ggreen and blue top sent to lab

## 2022-04-30 ENCOUNTER — Encounter (HOSPITAL_COMMUNITY): Payer: Self-pay | Admitting: Family Medicine

## 2022-04-30 DIAGNOSIS — K3189 Other diseases of stomach and duodenum: Secondary | ICD-10-CM | POA: Diagnosis not present

## 2022-04-30 DIAGNOSIS — K922 Gastrointestinal hemorrhage, unspecified: Secondary | ICD-10-CM

## 2022-04-30 DIAGNOSIS — S32050A Wedge compression fracture of fifth lumbar vertebra, initial encounter for closed fracture: Secondary | ICD-10-CM | POA: Diagnosis not present

## 2022-04-30 DIAGNOSIS — D509 Iron deficiency anemia, unspecified: Secondary | ICD-10-CM

## 2022-04-30 DIAGNOSIS — K259 Gastric ulcer, unspecified as acute or chronic, without hemorrhage or perforation: Secondary | ICD-10-CM | POA: Diagnosis not present

## 2022-04-30 DIAGNOSIS — K449 Diaphragmatic hernia without obstruction or gangrene: Secondary | ICD-10-CM | POA: Diagnosis not present

## 2022-04-30 DIAGNOSIS — K921 Melena: Secondary | ICD-10-CM | POA: Diagnosis not present

## 2022-04-30 DIAGNOSIS — D62 Acute posthemorrhagic anemia: Secondary | ICD-10-CM | POA: Diagnosis not present

## 2022-04-30 LAB — VITAMIN B12: Vitamin B-12: 199 pg/mL (ref 180–914)

## 2022-04-30 LAB — CBC
HCT: 22.9 % — ABNORMAL LOW (ref 36.0–46.0)
HCT: 23.9 % — ABNORMAL LOW (ref 36.0–46.0)
HCT: 24.1 % — ABNORMAL LOW (ref 36.0–46.0)
Hemoglobin: 7.2 g/dL — ABNORMAL LOW (ref 12.0–15.0)
Hemoglobin: 7.4 g/dL — ABNORMAL LOW (ref 12.0–15.0)
Hemoglobin: 7.4 g/dL — ABNORMAL LOW (ref 12.0–15.0)
MCH: 31.3 pg (ref 26.0–34.0)
MCH: 31.4 pg (ref 26.0–34.0)
MCH: 31.4 pg (ref 26.0–34.0)
MCHC: 31.4 g/dL (ref 30.0–36.0)
MCHC: 31 g/dL (ref 30.0–36.0)
MCHC: 30.7 g/dL (ref 30.0–36.0)
MCV: 99.6 fL (ref 80.0–100.0)
MCV: 101.3 fL — ABNORMAL HIGH (ref 80.0–100.0)
MCV: 102.1 fL — ABNORMAL HIGH (ref 80.0–100.0)
Platelets: 246 10*3/uL (ref 150–400)
Platelets: 253 10*3/uL (ref 150–400)
Platelets: 265 10*3/uL (ref 150–400)
RBC: 2.3 MIL/uL — ABNORMAL LOW (ref 3.87–5.11)
RBC: 2.36 MIL/uL — ABNORMAL LOW (ref 3.87–5.11)
RBC: 2.36 MIL/uL — ABNORMAL LOW (ref 3.87–5.11)
RDW: 16.9 % — ABNORMAL HIGH (ref 11.5–15.5)
RDW: 16.9 % — ABNORMAL HIGH (ref 11.5–15.5)
RDW: 17.3 % — ABNORMAL HIGH (ref 11.5–15.5)
WBC: 10.8 10*3/uL — ABNORMAL HIGH (ref 4.0–10.5)
WBC: 10.8 10*3/uL — ABNORMAL HIGH (ref 4.0–10.5)
WBC: 11 10*3/uL — ABNORMAL HIGH (ref 4.0–10.5)
nRBC: 0.2 % (ref 0.0–0.2)
nRBC: 0 % (ref 0.0–0.2)
nRBC: 0 % (ref 0.0–0.2)

## 2022-04-30 LAB — BASIC METABOLIC PANEL
Anion gap: 7 (ref 5–15)
BUN: 40 mg/dL — ABNORMAL HIGH (ref 8–23)
CO2: 23 mmol/L (ref 22–32)
Calcium: 8.4 mg/dL — ABNORMAL LOW (ref 8.9–10.3)
Chloride: 113 mmol/L — ABNORMAL HIGH (ref 98–111)
Creatinine, Ser: 0.93 mg/dL (ref 0.44–1.00)
GFR, Estimated: 56 mL/min — ABNORMAL LOW (ref >60)
Glucose, Bld: 99 mg/dL (ref 70–99)
Potassium: 4.1 mmol/L (ref 3.5–5.1)
Sodium: 143 mmol/L (ref 135–145)

## 2022-04-30 LAB — TYPE AND SCREEN
ABO/RH(D): O POS
Antibody Screen: NEGATIVE

## 2022-04-30 LAB — IRON AND TIBC
Iron: 21 ug/dL — ABNORMAL LOW (ref 28–170)
Saturation Ratios: 7 % — ABNORMAL LOW (ref 10.4–31.8)
TIBC: 316 ug/dL (ref 250–450)
UIBC: 295 ug/dL

## 2022-04-30 LAB — ABO/RH: ABO/RH(D): O POS

## 2022-04-30 LAB — FERRITIN: Ferritin: 43 ng/mL (ref 11–307)

## 2022-04-30 LAB — FOLATE: Folate: 16.7 ng/mL (ref >5.9)

## 2022-04-30 MED ORDER — SODIUM CHLORIDE 0.9 % IV SOLN: INTRAVENOUS | Status: DC

## 2022-04-30 MED ORDER — VITAMIN B-12 1000 MCG PO TABS
1000.0000 ug | ORAL_TABLET | Freq: Every day | ORAL | Status: DC
  Administered 2022-05-01 – 2022-05-02 (×2): 1000 ug via ORAL
  Filled 2022-04-30 (×2): qty 1

## 2022-04-30 MED ORDER — SODIUM CHLORIDE 0.9 % IV SOLN
250.0000 mg | Freq: Every day | INTRAVENOUS | Status: AC
  Administered 2022-04-30 – 2022-05-01 (×2): 250 mg via INTRAVENOUS
  Filled 2022-04-30 (×2): qty 20

## 2022-04-30 NOTE — Progress Notes (Signed)
Mobility Specialist - Progress Note   04/30/22 0920  Therapy Vitals  BP (!) 111/49  Patient Position (if appropriate) Standing  Mobility  Activity Stood at bedside  Level of Assistance Standby assist, set-up cues, supervision of patient - no hands on  Assistive Device None  Distance Ambulated (ft) 0 ft  Activity Response Tolerated well  Mobility Referral Yes  $Mobility charge 1 Mobility   Pt received in recliner and agreeable to mobility. Pt c/o feeling dizzy upon standing. Prompted pt to sit while checking BP. BP checked once while sitting & once while standing. No other complaints during mobility & stated she will attempt ambulation this afternoon. Pt to recliner after session with all needs met. Nurse notified at [facility] of vitals.   Sitting: 135/69  BP Standing: 111/49 BP  Set designer

## 2022-04-30 NOTE — Consult Note (Signed)
Reason for Consult: GI bleed Referring Physician: Hospital team  Brianna Bush is an 88 y.o. female.  HPI: Patient seen and examined and hospital computer chart reviewed and case discussed with multiple family members all of their questions were answered and she does drink socially and does use a moderate amount of Advil and Aleve for her back and to help her sleep although not necessarily every day and she has not had a significant GI history up till now but may have had a colonoscopy years ago and her daughter believes she had some black stools but currently she is tolerating clear liquids and has not had a bowel movement today  Past Medical History:  Diagnosis Date   Acute sinusitis, unspecified    Diverticulosis    Endometrial carcinoma (HCC)    GERD (gastroesophageal reflux disease)    Rosanna Randy disease    Hyperlipidemia    Hypertension    IBS (irritable bowel syndrome)    Osteopenia    Vitamin D deficiency     History reviewed. No pertinent surgical history.  History reviewed. No pertinent family history.  Social History:  has no history on file for tobacco use, alcohol use, and drug use.  Allergies:  Allergies  Allergen Reactions   Atenolol-Chlorthalidone Other (See Comments)    Bradycardia     Medications: I have reviewed the patient's current medications.  Results for orders placed or performed during the hospital encounter of 04/29/22 (from the past 48 hour(s))  Urinalysis, Routine w reflex microscopic Urine, Clean Catch     Status: Abnormal   Collection Time: 04/29/22  3:53 PM  Result Value Ref Range   Color, Urine YELLOW YELLOW   APPearance HAZY (A) CLEAR   Specific Gravity, Urine 1.019 1.005 - 1.030   pH 5.0 5.0 - 8.0   Glucose, UA NEGATIVE NEGATIVE mg/dL   Hgb urine dipstick NEGATIVE NEGATIVE   Bilirubin Urine NEGATIVE NEGATIVE   Ketones, ur NEGATIVE NEGATIVE mg/dL   Protein, ur 30 (A) NEGATIVE mg/dL   Nitrite NEGATIVE NEGATIVE   Leukocytes,Ua TRACE  (A) NEGATIVE   RBC / HPF 0-5 0 - 5 RBC/hpf   WBC, UA 11-20 0 - 5 WBC/hpf   Bacteria, UA NONE SEEN NONE SEEN   Squamous Epithelial / LPF 0-5 0 - 5 /HPF   Mucus PRESENT    Hyaline Casts, UA PRESENT     Comment: Performed at [facility], [place] [address] [place], [place] [address]  CBC with Differential     Status: Abnormal   Collection Time: 04/29/22  4:05 PM  Result Value Ref Range   WBC 10.3 4.0 - 10.5 K/uL   RBC 2.75 (L) 3.87 - 5.11 MIL/uL   Hemoglobin 8.5 (L) 12.0 - 15.0 g/dL   HCT 27.8 (L) 36.0 - 46.0 %   MCV 101.1 (H) 80.0 - 100.0 fL   MCH 30.9 26.0 - 34.0 pg   MCHC 30.6 30.0 - 36.0 g/dL   RDW 16.8 (H) 11.5 - 15.5 %   Platelets PLATELET CLUMPS NOTED ON SMEAR, UNABLE TO ESTIMATE 150 - 400 K/uL   nRBC 0.2 0.0 - 0.2 %   Neutrophils Relative % 63 %   Neutro Abs 6.4 1.7 - 7.7 K/uL   Lymphocytes Relative 28 %   Lymphs Abs 2.9 0.7 - 4.0 K/uL   Monocytes Relative 8 %   Monocytes Absolute 0.9 0.1 - 1.0 K/uL   Eosinophils Relative 1 %   Eosinophils Absolute 0.1 0.0 - 0.5 K/uL  Basophils Relative 0 %   Basophils Absolute 0.0 0.0 - 0.1 K/uL   Immature Granulocytes 0 %   Abs Immature Granulocytes 0.04 0.00 - 0.07 K/uL   Polychromasia PRESENT    Ovalocytes PRESENT     Comment: Performed at [facility], [place] [address], [place], [place] [address]  Comprehensive metabolic panel     Status: Abnormal   Collection Time: 04/29/22  4:05 PM  Result Value Ref Range   Sodium 146 (H) 135 - 145 mmol/L   Potassium 5.3 (H) 3.5 - 5.1 mmol/L   Chloride 112 (H) 98 - 111 mmol/L   CO2 25 22 - 32 mmol/L   Glucose, Bld 127 (H) 70 - 99 mg/dL    Comment: Glucose reference range applies only to samples taken after fasting for at least 8 hours.   BUN 48 (H) 8 - 23 mg/dL   Creatinine, Ser 1.08 (H) 0.44 - 1.00 mg/dL   Calcium 9.8 8.9 - 10.3 mg/dL   Total Protein 6.5 6.5 - 8.1 g/dL   Albumin 3.9 3.5 - 5.0 g/dL   AST 24 15 - 41 U/L   ALT 14 0 - 44 U/L   Alkaline  Phosphatase 50 38 - 126 U/L   Total Bilirubin 1.0 0.3 - 1.2 mg/dL   GFR, Estimated 47 (L) >60 mL/min    Comment: (NOTE) Calculated using the CKD-EPI Creatinine Equation (2021)    Anion gap 9 5 - 15    Comment: Performed at [facility], [place] [address], [place], [place] [address]  Lipase, blood     Status: Abnormal   Collection Time: 04/29/22  4:05 PM  Result Value Ref Range   Lipase 53 (H) 11 - 51 U/L    Comment: Performed at [facility], [place] [address] [place], [place] [address]  POC occult blood, ED Provider will collect     Status: Abnormal   Collection Time: 04/29/22  9:32 PM  Result Value Ref Range   Fecal Occult Bld POSITIVE (A) NEGATIVE  CBC     Status: Abnormal   Collection Time: 04/29/22 10:55 PM  Result Value Ref Range   WBC 11.7 (H) 4.0 - 10.5 K/uL   RBC 2.40 (L) 3.87 - 5.11 MIL/uL   Hemoglobin 7.5 (L) 12.0 - 15.0 g/dL   HCT 24.1 (L) 36.0 - 46.0 %   MCV 100.4 (H) 80.0 - 100.0 fL   MCH 31.3 26.0 - 34.0 pg   MCHC 31.1 30.0 - 36.0 g/dL   RDW 16.5 (H) 11.5 - 15.5 %   Platelets 242 150 - 400 K/uL   nRBC 0.0 0.0 - 0.2 %    Comment: Performed at [facility], [place] [address], [place], [place] [address]  Reticulocytes     Status: Abnormal   Collection Time: 04/29/22 10:55 PM  Result Value Ref Range   Retic Ct Pct 5.2 (H) 0.4 - 3.1 %   RBC. 2.42 (L) 3.87 - 5.11 MIL/uL   Retic Count, Absolute 125.8 19.0 - 186.0 K/uL   Immature Retic Fract 37.7 (H) 2.3 - 15.9 %    Comment: Performed at [facility], [place] [address] [place],  [address]  Type and screen [place]     Status: None   Collection Time: 04/29/22 10:56 PM  Result Value Ref Range   ABO/RH(D) O POS    Antibody Screen NEG    Sample Expiration      05/02/2022,2359  Performed at [facility], [place] [address], [place], [place] [address]   Vitamin B12     Status: None   Collection Time: 04/29/22  11:17 PM  Result Value Ref Range   Vitamin B-12 199 180 - 914 pg/mL    Comment: (NOTE) This assay is not validated for testing neonatal or myeloproliferative syndrome specimens for Vitamin B12 levels. Performed at [facility], [place] [address] [place], [place] [address]   Folate     Status: None   Collection Time: 04/29/22 11:17 PM  Result Value Ref Range   Folate 16.7 >5.9 ng/mL    Comment: Performed at [facility], [place] [address] [place], [place] [address]  Iron and TIBC     Status: Abnormal   Collection Time: 04/29/22 11:17 PM  Result Value Ref Range   Iron 21 (L) 28 - 170 ug/dL   TIBC 316 250 - 450 ug/dL   Saturation Ratios 7 (L) 10.4 - 31.8 %   UIBC 295 ug/dL    Comment: Performed at [facility], [place] [address], [place], [place] [address]  Ferritin     Status: None   Collection Time: 04/29/22 11:17 PM  Result Value Ref Range   Ferritin 43 11 - 307 ng/mL    Comment: Performed at [facility], [place] [address] [place], [place] [address]  CBC     Status: Abnormal   Collection Time: 04/30/22  3:28 AM  Result Value Ref Range   WBC 10.8 (H) 4.0 - 10.5 K/uL   RBC 2.30 (L) 3.87 - 5.11 MIL/uL   Hemoglobin 7.2 (L) 12.0 - 15.0 g/dL   HCT 22.9 (L) 36.0 - 46.0 %   MCV 99.6 80.0 - 100.0 fL   MCH 31.3 26.0 - 34.0 pg   MCHC 31.4 30.0 - 36.0 g/dL   RDW 16.9 (H) 11.5 - 15.5 %   Platelets 246 150 - 400 K/uL   nRBC 0.2 0.0 - 0.2 %    Comment: Performed at [facility], [place] [address], [place], [place] [address]  Basic metabolic panel     Status: Abnormal   Collection Time: 04/30/22  3:28 AM  Result Value Ref Range   Sodium 143 135 - 145 mmol/L   Potassium 4.1 3.5 - 5.1 mmol/L   Chloride 113 (H) 98 - 111 mmol/L   CO2 23 22 - 32 mmol/L   Glucose, Bld 99 70 - 99 mg/dL    Comment: Glucose reference range applies only to samples taken after fasting for at least 8 hours.   BUN 40 (H) 8 - 23  mg/dL   Creatinine, Ser 0.93 0.44 - 1.00 mg/dL   Calcium 8.4 (L) 8.9 - 10.3 mg/dL   GFR, Estimated 56 (L) >60 mL/min    Comment: (NOTE) Calculated using the CKD-EPI Creatinine Equation (2021)    Anion gap 7 5 - 15    Comment: Performed at [facility], [place] [address], [place], [place] [address]  ABO/Rh     Status: None   Collection Time: 04/30/22  3:28 AM  Result Value Ref Range   ABO/RH(D)      O POS Performed at [facility], [place] [address] [place],  [address]   CBC     Status: Abnormal   Collection Time: 04/30/22 10:39 AM  Result Value Ref Range   WBC 10.8 (H) 4.0 - 10.5 K/uL   RBC 2.36 (L) 3.87 -  5.11 MIL/uL   Hemoglobin 7.4 (L) 12.0 - 15.0 g/dL   HCT 23.9 (L) 36.0 - 46.0 %   MCV 101.3 (H) 80.0 - 100.0 fL   MCH 31.4 26.0 - 34.0 pg   MCHC 31.0 30.0 - 36.0 g/dL   RDW 16.9 (H) 11.5 - 15.5 %   Platelets 253 150 - 400 K/uL   nRBC 0.0 0.0 - 0.2 %    Comment: Performed at [facility], [place] [address], [place], [place] [address]  CBC     Status: Abnormal   Collection Time: 04/30/22  3:59 PM  Result Value Ref Range   WBC 11.0 (H) 4.0 - 10.5 K/uL   RBC 2.36 (L) 3.87 - 5.11 MIL/uL   Hemoglobin 7.4 (L) 12.0 - 15.0 g/dL   HCT 24.1 (L) 36.0 - 46.0 %   MCV 102.1 (H) 80.0 - 100.0 fL   MCH 31.4 26.0 - 34.0 pg   MCHC 30.7 30.0 - 36.0 g/dL   RDW 17.3 (H) 11.5 - 15.5 %   Platelets 265 150 - 400 K/uL   nRBC 0.0 0.0 - 0.2 %    Comment: Performed at [facility], [place] [address] [place], [place] [address]    US RENAL  Result Date: 04/29/2022 CLINICAL DATA:  Follow-up indeterminate renal lesions on same-day CT abdomen and pelvis EXAM: RENAL / URINARY TRACT ULTRASOUND COMPLETE COMPARISON:  CT 04/29/2022 FINDINGS: Right Kidney: Renal measurements: 9.5 x 3.7 x 4.4 cm = volume: 81 mL. Two anechoic cysts in the posterior right kidney measure 1.5 and 1.2 cm respectively. No internal vascularity. A third cyst in  the right kidney also is anechoic without internal vascularity and measures 1.6 cm. No hydronephrosis. Normal cortical echogenicity. Left Kidney: Renal measurements: 9.1 x 4.9 x 4.7 cm = volume: 110 mL. Anechoic cysts without internal vascularity in the left kidney measure 2.0 cm and 0.9 cm, respectively. Normal cortical echogenicity. No hydronephrosis. Bladder: Appears normal for degree of bladder distention. Other: None. IMPRESSION: Indeterminate lesion seen on same day CT abdomen and pelvis are compatible with benign simple cysts given ultrasound characteristics. No additional follow-up is recommended. Electronically Signed   By: Placido Sou M.D.   On: 04/29/2022 21:26   CT L-SPINE NO CHARGE  Result Date: 04/29/2022 CLINICAL DATA:  Initial evaluation for acute abdominal pain, nausea, vomiting. EXAM: CT LUMBAR SPINE WITHOUT CONTRAST TECHNIQUE: Multidetector CT imaging of the lumbar spine was performed without intravenous contrast administration. Multiplanar CT image reconstructions were also generated. RADIATION DOSE REDUCTION: This exam was performed according to the departmental dose-optimization program which includes automated exposure control, adjustment of the mA and/or kV according to patient size and/or use of iterative reconstruction technique. COMPARISON:  Comparison made with prior radiograph from 06/13/2022 as well as previous. FINDINGS: Segmentation: Standard. Lowest well-formed disc space labeled the L5-S1 level. Alignment: Moderate dextroscoliosis, apex at L1-2. Slight rotatory component again noted. 4 mm retrolisthesis of L1 on L2, 5 mm retrolisthesis of L2 on L3, 5 mm anterolisthesis of L4 on L5 and L5 on S1. Findings chronic and degenerative. Vertebrae: Subtle concavity at the superior endplate of L5 with subchondral gas lucency, age indeterminate, and could reflect a subtle acute to subacute compression fracture. Otherwise, vertebral body height relatively well maintained with no other  acute or recent fracture. Visualized sacrum and pelvis intact. No worrisome osseous lesions. Paraspinal and other soft tissues: Paraspinous soft tissues demonstrate no acute finding. Cardiomegaly noted. Advanced aorto bi-iliac atherosclerotic disease. Moderate retained stool  noted within the distal colon/rectum, suggesting constipation. Intermediate density right renal lesions again noted, indeterminate. Disc levels: L1-2: Retrolisthesis. Degenerative intervertebral disc space narrowing with diffuse disc bulge and disc desiccation. Reactive endplate spurring. Mild facet hypertrophy. No significant canal stenosis. Foramina remain patent. L2-3: Retrolisthesis with advanced degenerative intervertebral disc space narrowing. Diffuse disc bulge with reactive endplate spurring, worse on the left. Bilateral facet hypertrophy. No spinal stenosis. Moderate left L2 foraminal narrowing. Right neural foramen remains patent. L3-4: Degenerative disc space narrowing with diffuse disc bulge and disc desiccation. Reactive endplate spurring. Moderate facet hypertrophy. Resultant mild canal with moderate bilateral subarticular stenosis. Moderate right with mild left L3 foraminal narrowing. L4-5: Anterolisthesis. Disc bulge with disc desiccation. Superimposed right foraminal disc protrusion closely approximates the exiting right L4 nerve root. Moderate facet arthrosis. Resultant mild left with moderate right lateral recess stenosis. Mild left with moderate right L4 foraminal narrowing. L5-S1: Anterolisthesis. Disc bulge with disc desiccation. Reactive endplate spurring. Moderate to advanced facet arthrosis. No significant spinal stenosis. Moderate right with mild left L5 foraminal narrowing. IMPRESSION: 1. Subtle concavity at the superior endplate of L5, age indeterminate, but could reflect a subtle acute to subacute compression fracture. Correlation with physical exam for possible pain at this location recommended. 2. No other acute  osseous abnormality within the lumbar spine. 3. Moderate dextroscoliosis with advanced multilevel degenerative spondylosis and facet arthrosis as above. Resultant mild canal with moderate bilateral subarticular stenosis at L3-4. 4. Right foraminal disc protrusion at L4-5, potentially affecting the right L4 nerve root. 5. Intermediate density lesions within the right kidney, indeterminate. While these findings could reflect proteinaceous and/or hemorrhagic cyst, possible renal neoplasms could also have this appearance. Further evaluation with dedicated renal mass protocol CT and/or MRI suggested for further evaluation. Aortic Atherosclerosis (ICD10-I70.0). Electronically Signed   By: Jeannine Boga M.D.   On: 04/29/2022 20:16   CT ABDOMEN PELVIS W CONTRAST  Result Date: 04/29/2022 CLINICAL DATA:  Acute nonlocalized abdominal pain with nausea and weakness for 3 days. EXAM: CT ABDOMEN AND PELVIS WITH CONTRAST TECHNIQUE: Multidetector CT imaging of the abdomen and pelvis was performed using the standard protocol following bolus administration of intravenous contrast. RADIATION DOSE REDUCTION: This exam was performed according to the departmental dose-optimization program which includes automated exposure control, adjustment of the mA and/or kV according to patient size and/or use of iterative reconstruction technique. CONTRAST:  40m OMNIPAQUE IOHEXOL 300 MG/ML  SOLN COMPARISON:  October 10, 2015. FINDINGS: Lower chest: Basilar atelectasis and septal thickening. Mild bronchiectatic changes at the LEFT lung base. Cardiomegaly. Coronary artery disease calcification of RIGHT coronary circulation. No chest wall abnormality. Hepatobiliary: Lobular hepatic contours. No focal, suspicious hepatic lesion or pericholecystic stranding. No biliary duct dilation. Elongated RIGHT hepatic lobe may represent RIGHT else lobe. Pancreas: Normal, without mass, inflammation or ductal dilatation. Spleen: Normal. Adrenals/Urinary  Tract: Adrenal glands are normal. No hydronephrosis or perinephric stranding. Cyst arises from the lower pole the LEFT kidney measuring water density and 1.8 cm. Along the posterior hilar lip of the LEFT kidney is a 69 Hounsfield unit 1.1 cm lesion that is well-circumscribed and slightly enlarged where it measured 9 mm on the prior study. Intermediate density lesions arising from the lateral cortex of the RIGHT kidney also appears slightly increased in size, largest measuring 15 mm (image 12/2 with a density of 70 Hounsfield units previously displaying a density of 56 Hounsfield units on noncontrast imaging. There is an increase in number of intermediate density lesions along the lateral cortex of  the RIGHT kidney since previous imaging approximately 4 additional lesions are seen on images 51 and 55 of series 4 No definite ureteral calculi or hydronephrosis. Urinary bladder is collapsed limiting assessment. There are phleboliths in the pelvis which are similar to prior imaging. Stomach/Bowel: Stomach is under distended limiting evaluation. Question antral thickening and distal body thickening and irregularity which persists from initial to the late phase. Small bowel without dilation or signs of inflammation. Colon without dilation or inflammation stool in the rectum only small volume. Appendix not visualized but no secondary signs that would suggest acute appendicitis Vascular/Lymphatic: Aortic atherosclerosis. No sign of aneurysm. Smooth contour of the IVC. There is no gastrohepatic or hepatoduodenal ligament lymphadenopathy. No retroperitoneal or mesenteric lymphadenopathy. No pelvic sidewall lymphadenopathy. Atherosclerosis is moderate to marked. Reproductive: Post hysterectomy. Other: No ascites.  No pneumoperitoneum. Musculoskeletal: Rotary dextroconvex scoliotic curvature centered about the L1 vertebral level similar to previous imaging. No acute musculoskeletal findings. Retrolisthesis of L1 on L2 and L2 on  L3 is moderate and similar to prior imaging as well. IMPRESSION: 1. Question of antral thickening and distal gastric body thickening and irregularity which persists from initial to the late phase. Correlate with any symptoms of gastritis or peptic ulcer disease. 2. Intermediate density lesions arising from the lateral cortex of the RIGHT kidney also appears slightly increased in size since previous imaging. These may represent hemorrhagic or proteinaceous cysts but are incompletely characterized not allowing for exclusion of small solid renal neoplasms. Consider follow-up renal sonogram for further evaluation. 3. Cardiomegaly with coronary artery disease calcification of RIGHT coronary circulation. 4. Aortic atherosclerosis. 5. Rotary dextroconvex scoliotic curvature centered about the L1 vertebral level similar to previous imaging. Aortic Atherosclerosis (ICD10-I70.0). Electronically Signed   By: Zetta Bills M.D.   On: 04/29/2022 18:40    ROS negative except above Blood pressure (!) 135/59, pulse 69, temperature 97.8 F (36.6 C), temperature source Oral, resp. rate 16, height '5\' 2"'$  (1.575 m), weight 68 kg, SpO2 99 %. Physical Exam vital signs stable afebrile no acute distress in good spirits looks good for her age exam pertinent for her abdomen being soft nontender labs and CT reviewed BUN decreased a little she is not iron deficient but might expect hemoglobin to drop further with hydration as BUN decreases towards normal  Assessment/Plan: Melena probably upper GI bleed in patient with abnormal CT Plan: The risk benefits methods of endoscopy was discussed with the patient and multiple family members and we answered all of their questions and we will proceed tomorrow at 1015 with further workup and plans pending those findings and consider transfusion based on her age particularly if any at wrist lesions and please call me sooner if any GI question or problems or signs of acute bleeding  Oluwatobi Ruppe  E 04/30/2022, 5:31 PM

## 2022-04-30 NOTE — Progress Notes (Signed)
[facility] PROGRESS NOTE   Brianna Bush ACZ:[id number] DOB: 22-Sep-1923 DOA: 04/29/2022  PCP: Antony Blackbird, MD  Brief History/Interval Summary: 88 y.o. female with medical history significant for hypertension, hyperlipidemia, GERD, and CKD 3A who presented to emergency department with abdominal pain, back pain, nausea, and general weakness. Patient began experiencing pain in her back 3 days ago that she was attributing to moving a Christmas tree, but also began to have some pain in the upper abdomen.  She denies any nausea or vomiting associated with this and has not noticed any melena or hematochezia.  She reports using Advil occasionally as needed, took 40 mg of Advil yesterday, but reports that she never takes more than 1 dose per day and does not use more than 400 mg at a time. CT of the abdomen pelvis raises question of antral and distal gastric body thickening.  Fecal occult blood testing is positive.  Stool appeared melanotic per ED PA.  Consultants: Gastroenterology  Procedures: None yet    Subjective/Interval History: Patient denies any abdominal pain nausea or vomiting.  Does feel fatigued.  Complains of lower back pain.    Assessment/Plan:  Concern for GI bleed/acute blood loss anemia/iron deficiency CT scan raise concern for peptic ulcer disease.  Patient has been taking Advil for back pain.  Hemoglobin was 15 in 2017 and apparently had unremarkable blood work in October 2023.  Presented with hemoglobin of 8.5.  Stool was Hemoccult positive.  Stool appeared to be melanotic per ED provider. Patient currently on PPI.  Gastroenterology has been consulted. Monitor hemoglobin closely.  Transfuse if it drops below 7. Continue n.p.o. status till patient has been seen by gastroenterology.  Patient told to avoid NSAIDs. Anemia panel reviewed.  Ferritin 43, iron 21, TIBC 316, percent saturation 7.  May benefit from iron infusion which will be ordered.   Low B12 level  noted.  See below  Back pain/L5 compression fracture Mildly tender to palpation.  No neurological weakness.  PT and OT evaluation.  Pain does not appear to be severe.  Will try conservative measures first.  Chronic kidney disease stage IIIa/hyperkalemia Stable.  Monitor urine output.  Avoid nephrotoxic agents.   Potassium level noted to be normal today.  B12 deficiency Vitamin B12 level 199.  Will start B12 supplementation.  Folic acid level is normal at 16.7.  Right kidney lesions Noted to be benign cysts on ultrasound.  No further workup.   DVT Prophylaxis: SCDs Code Status: Full code Family Communication: Discussed with the patient.  No family at bedside Disposition Plan: Hopefully return home when improved.  Status is: Observation The patient will require care spanning > 2 midnights and should be moved to inpatient because: Need to monitor hemoglobin due to concern for GI bleed.      Medications: Scheduled:  [START ON 05/01/2022] vitamin B-12  1,000 mcg Oral Daily   pantoprazole (PROTONIX) IV  40 mg Intravenous Q12H   sodium chloride flush  3 mL Intravenous Q12H   Continuous: FUX:NATFTDDUKGURK **OR** acetaminophen, ondansetron **OR** ondansetron (ZOFRAN) IV  Antibiotics: Anti-infectives (From admission, onward)    None       Objective:  Vital Signs  Vitals:   04/30/22 0123 04/30/22 0525 04/30/22 0920 04/30/22 0926  BP: (!) 151/71 119/79 (!) 111/49 (!) 111/49  Pulse: 82 77  78  Resp: '18 18  16  '$ Temp: (!) 97.5 F (36.4 C) 98 F (36.7 C)  (!) 97.4 F (36.3 C)  TempSrc: Oral Oral  Oral  SpO2: 100% 96%  100%  Weight:      Height:        Intake/Output Summary (Last 24 hours) at 04/30/2022 1012 Last data filed at 04/30/2022 0815 Gross per 24 hour  Intake 0 ml  Output --  Net 0 ml   Filed Weights   04/29/22 1559  Weight: 68 kg    General appearance: Awake alert.  In no distress Resp: Clear to auscultation bilaterally.  Normal effort Cardio:  S1-S2 is normal regular.  No S3-S4.  No rubs murmurs or bruit GI: Abdomen is soft.  Nontender nondistended.  Bowel sounds are present normal.  No masses organomegaly Extremities: No edema.  Full range of motion of lower extremities. Neurologic: Alert and oriented x3.  No focal neurological deficits.    Lab Results:  Data Reviewed: I have personally reviewed following labs and reports of the imaging studies  CBC: Recent Labs  Lab 04/29/22 1605 04/29/22 2255 04/30/22 0328  WBC 10.3 11.7* 10.8*  NEUTROABS 6.4  --   --   HGB 8.5* 7.5* 7.2*  HCT 27.8* 24.1* 22.9*  MCV 101.1* 100.4* 99.6  PLT PLATELET CLUMPS NOTED ON SMEAR, UNABLE TO ESTIMATE 242 161    Basic Metabolic Panel: Recent Labs  Lab 04/29/22 1605 04/30/22 0328  NA 146* 143  K 5.3* 4.1  CL 112* 113*  CO2 25 23  GLUCOSE 127* 99  BUN 48* 40*  CREATININE 1.08* 0.93  CALCIUM 9.8 8.4*    GFR: Estimated Creatinine Clearance: 32 mL/min (by C-G formula based on SCr of 0.93 mg/dL).  Liver Function Tests: Recent Labs  Lab 04/29/22 1605  AST 24  ALT 14  ALKPHOS 50  BILITOT 1.0  PROT 6.5  ALBUMIN 3.9    Recent Labs  Lab 04/29/22 1605  LIPASE 53*     Anemia Panel: Recent Labs    04/29/22 2255 04/29/22 2317  VITAMINB12  --  199  FOLATE  --  16.7  FERRITIN  --  43  TIBC  --  316  IRON  --  21*  RETICCTPCT 5.2*  --      Radiology Studies: US RENAL  Result Date: 04/29/2022 CLINICAL DATA:  Follow-up indeterminate renal lesions on same-day CT abdomen and pelvis EXAM: RENAL / URINARY TRACT ULTRASOUND COMPLETE COMPARISON:  CT 04/29/2022 FINDINGS: Right Kidney: Renal measurements: 9.5 x 3.7 x 4.4 cm = volume: 81 mL. Two anechoic cysts in the posterior right kidney measure 1.5 and 1.2 cm respectively. No internal vascularity. A third cyst in the right kidney also is anechoic without internal vascularity and measures 1.6 cm. No hydronephrosis. Normal cortical echogenicity. Left Kidney: Renal measurements: 9.1  x 4.9 x 4.7 cm = volume: 110 mL. Anechoic cysts without internal vascularity in the left kidney measure 2.0 cm and 0.9 cm, respectively. Normal cortical echogenicity. No hydronephrosis. Bladder: Appears normal for degree of bladder distention. Other: None. IMPRESSION: Indeterminate lesion seen on same day CT abdomen and pelvis are compatible with benign simple cysts given ultrasound characteristics. No additional follow-up is recommended. Electronically Signed   By: Placido Sou M.D.   On: 04/29/2022 21:26   CT L-SPINE NO CHARGE  Result Date: 04/29/2022 CLINICAL DATA:  Initial evaluation for acute abdominal pain, nausea, vomiting. EXAM: CT LUMBAR SPINE WITHOUT CONTRAST TECHNIQUE: Multidetector CT imaging of the lumbar spine was performed without intravenous contrast administration. Multiplanar CT image reconstructions were also generated. RADIATION DOSE REDUCTION: This exam was performed according to the departmental dose-optimization program which  includes automated exposure control, adjustment of the mA and/or kV according to patient size and/or use of iterative reconstruction technique. COMPARISON:  Comparison made with prior radiograph from 06/13/2022 as well as previous. FINDINGS: Segmentation: Standard. Lowest well-formed disc space labeled the L5-S1 level. Alignment: Moderate dextroscoliosis, apex at L1-2. Slight rotatory component again noted. 4 mm retrolisthesis of L1 on L2, 5 mm retrolisthesis of L2 on L3, 5 mm anterolisthesis of L4 on L5 and L5 on S1. Findings chronic and degenerative. Vertebrae: Subtle concavity at the superior endplate of L5 with subchondral gas lucency, age indeterminate, and could reflect a subtle acute to subacute compression fracture. Otherwise, vertebral body height relatively well maintained with no other acute or recent fracture. Visualized sacrum and pelvis intact. No worrisome osseous lesions. Paraspinal and other soft tissues: Paraspinous soft tissues demonstrate no  acute finding. Cardiomegaly noted. Advanced aorto bi-iliac atherosclerotic disease. Moderate retained stool noted within the distal colon/rectum, suggesting constipation. Intermediate density right renal lesions again noted, indeterminate. Disc levels: L1-2: Retrolisthesis. Degenerative intervertebral disc space narrowing with diffuse disc bulge and disc desiccation. Reactive endplate spurring. Mild facet hypertrophy. No significant canal stenosis. Foramina remain patent. L2-3: Retrolisthesis with advanced degenerative intervertebral disc space narrowing. Diffuse disc bulge with reactive endplate spurring, worse on the left. Bilateral facet hypertrophy. No spinal stenosis. Moderate left L2 foraminal narrowing. Right neural foramen remains patent. L3-4: Degenerative disc space narrowing with diffuse disc bulge and disc desiccation. Reactive endplate spurring. Moderate facet hypertrophy. Resultant mild canal with moderate bilateral subarticular stenosis. Moderate right with mild left L3 foraminal narrowing. L4-5: Anterolisthesis. Disc bulge with disc desiccation. Superimposed right foraminal disc protrusion closely approximates the exiting right L4 nerve root. Moderate facet arthrosis. Resultant mild left with moderate right lateral recess stenosis. Mild left with moderate right L4 foraminal narrowing. L5-S1: Anterolisthesis. Disc bulge with disc desiccation. Reactive endplate spurring. Moderate to advanced facet arthrosis. No significant spinal stenosis. Moderate right with mild left L5 foraminal narrowing. IMPRESSION: 1. Subtle concavity at the superior endplate of L5, age indeterminate, but could reflect a subtle acute to subacute compression fracture. Correlation with physical exam for possible pain at this location recommended. 2. No other acute osseous abnormality within the lumbar spine. 3. Moderate dextroscoliosis with advanced multilevel degenerative spondylosis and facet arthrosis as above. Resultant mild  canal with moderate bilateral subarticular stenosis at L3-4. 4. Right foraminal disc protrusion at L4-5, potentially affecting the right L4 nerve root. 5. Intermediate density lesions within the right kidney, indeterminate. While these findings could reflect proteinaceous and/or hemorrhagic cyst, possible renal neoplasms could also have this appearance. Further evaluation with dedicated renal mass protocol CT and/or MRI suggested for further evaluation. Aortic Atherosclerosis (ICD10-I70.0). Electronically Signed   By: Jeannine Boga M.D.   On: 04/29/2022 20:16   CT ABDOMEN PELVIS W CONTRAST  Result Date: 04/29/2022 CLINICAL DATA:  Acute nonlocalized abdominal pain with nausea and weakness for 3 days. EXAM: CT ABDOMEN AND PELVIS WITH CONTRAST TECHNIQUE: Multidetector CT imaging of the abdomen and pelvis was performed using the standard protocol following bolus administration of intravenous contrast. RADIATION DOSE REDUCTION: This exam was performed according to the departmental dose-optimization program which includes automated exposure control, adjustment of the mA and/or kV according to patient size and/or use of iterative reconstruction technique. CONTRAST:  44m OMNIPAQUE IOHEXOL 300 MG/ML  SOLN COMPARISON:  October 10, 2015. FINDINGS: Lower chest: Basilar atelectasis and septal thickening. Mild bronchiectatic changes at the LEFT lung base. Cardiomegaly. Coronary artery disease calcification of RIGHT coronary circulation.  No chest wall abnormality. Hepatobiliary: Lobular hepatic contours. No focal, suspicious hepatic lesion or pericholecystic stranding. No biliary duct dilation. Elongated RIGHT hepatic lobe may represent RIGHT else lobe. Pancreas: Normal, without mass, inflammation or ductal dilatation. Spleen: Normal. Adrenals/Urinary Tract: Adrenal glands are normal. No hydronephrosis or perinephric stranding. Cyst arises from the lower pole the LEFT kidney measuring water density and 1.8 cm. Along the  posterior hilar lip of the LEFT kidney is a 69 Hounsfield unit 1.1 cm lesion that is well-circumscribed and slightly enlarged where it measured 9 mm on the prior study. Intermediate density lesions arising from the lateral cortex of the RIGHT kidney also appears slightly increased in size, largest measuring 15 mm (image 12/2 with a density of 70 Hounsfield units previously displaying a density of 56 Hounsfield units on noncontrast imaging. There is an increase in number of intermediate density lesions along the lateral cortex of the RIGHT kidney since previous imaging approximately 4 additional lesions are seen on images 51 and 55 of series 4 No definite ureteral calculi or hydronephrosis. Urinary bladder is collapsed limiting assessment. There are phleboliths in the pelvis which are similar to prior imaging. Stomach/Bowel: Stomach is under distended limiting evaluation. Question antral thickening and distal body thickening and irregularity which persists from initial to the late phase. Small bowel without dilation or signs of inflammation. Colon without dilation or inflammation stool in the rectum only small volume. Appendix not visualized but no secondary signs that would suggest acute appendicitis Vascular/Lymphatic: Aortic atherosclerosis. No sign of aneurysm. Smooth contour of the IVC. There is no gastrohepatic or hepatoduodenal ligament lymphadenopathy. No retroperitoneal or mesenteric lymphadenopathy. No pelvic sidewall lymphadenopathy. Atherosclerosis is moderate to marked. Reproductive: Post hysterectomy. Other: No ascites.  No pneumoperitoneum. Musculoskeletal: Rotary dextroconvex scoliotic curvature centered about the L1 vertebral level similar to previous imaging. No acute musculoskeletal findings. Retrolisthesis of L1 on L2 and L2 on L3 is moderate and similar to prior imaging as well. IMPRESSION: 1. Question of antral thickening and distal gastric body thickening and irregularity which persists from  initial to the late phase. Correlate with any symptoms of gastritis or peptic ulcer disease. 2. Intermediate density lesions arising from the lateral cortex of the RIGHT kidney also appears slightly increased in size since previous imaging. These may represent hemorrhagic or proteinaceous cysts but are incompletely characterized not allowing for exclusion of small solid renal neoplasms. Consider follow-up renal sonogram for further evaluation. 3. Cardiomegaly with coronary artery disease calcification of RIGHT coronary circulation. 4. Aortic atherosclerosis. 5. Rotary dextroconvex scoliotic curvature centered about the L1 vertebral level similar to previous imaging. Aortic Atherosclerosis (ICD10-I70.0). Electronically Signed   By: Zetta Bills M.D.   On: 04/29/2022 18:40       LOS: 0 days   [facility] Pager on [email or web address]  04/30/2022, 10:12 AM

## 2022-04-30 NOTE — Progress Notes (Signed)
Mobility Specialist - Progress Note   04/30/22 1543  Mobility  Activity Ambulated with assistance in hallway  Level of Assistance Standby assist, set-up cues, supervision of patient - no hands on  Assistive Device Front wheel walker  Distance Ambulated (ft) 250 ft  Activity Response Tolerated well  Mobility Referral Yes  $Mobility charge 1 Mobility   Pt received EOB and agreeable to mobility. C/o lower back pain when after ambulating. No complaints during session. Pt to recliner after session with all needs met & family in room.    [facility]

## 2022-04-30 NOTE — ED Notes (Signed)
ED TO INPATIENT HANDOFF REPORT  Name/Age/Gender Brianna Bush 88 y.o. female  Code Status    Code Status Orders  (From admission, onward)           Start     Ordered   04/29/22 2218  Full code  Continuous       Question:  By:  Answer:  Consent: discussion documented in EHR   04/29/22 2219           Code Status History     Date Active Date Inactive Code Status Order ID Comments User Context   10/11/2015 0128 10/12/2015 1450 Full Code [id number]  Vianne Bulls, MD ED       Home/SNF/Other Home  Chief Complaint GI bleeding [K92.2]  Level of Care/Admitting Diagnosis ED Disposition     ED Disposition  Admit   Condition  --   [facility]: [facility] [[id number]]  Level of Care: Telemetry [5]  Admit to tele based on following criteria: Monitor for Ischemic changes  May place patient in observation at [facility] or [facility] if equivalent level of care is available:: Yes  Covid Evaluation: Asymptomatic - no recent exposure (last 10 days) testing not required  Diagnosis: GI bleeding [010272]  Admitting Physician: Vianne Bulls [[id number]]  Attending Physician: Vianne Bulls [[id number]]          Medical History Past Medical History:  Diagnosis Date   Acute sinusitis, unspecified    Diverticulosis    Endometrial carcinoma ([place])    GERD (gastroesophageal reflux disease)    Rosanna Randy disease    Hyperlipidemia    Hypertension    IBS (irritable bowel syndrome)    Osteopenia    Vitamin D deficiency     Allergies Allergies  Allergen Reactions   Tenoretic [Atenolol-Chlorthalidone]     bradycardia    IV Location/Drains/Wounds Patient Lines/Drains/Airways Status     Active Line/Drains/Airways     Name Placement date Placement time Site Days   Peripheral IV 04/29/22 20 G Right Antecubital 04/29/22  1743  Antecubital  1   Peripheral IV 04/29/22 20 G 1" Anterior;Left;Proximal Forearm 04/29/22  2259  Forearm  1   Wound  / Incision (Open or Dehisced) 10/11/15 Other (Comment) Leg Left;Anterior non-healing ulcer 10/11/15  0153  Leg  2393            Labs/Imaging Results for orders placed or performed during the hospital encounter of 04/29/22 (from the past 48 hour(s))  Urinalysis, Routine w reflex microscopic Urine, Clean Catch     Status: Abnormal   Collection Time: 04/29/22  3:53 PM  Result Value Ref Range   Color, Urine YELLOW YELLOW   APPearance HAZY (A) CLEAR   Specific Gravity, Urine 1.019 1.005 - 1.030   pH 5.0 5.0 - 8.0   Glucose, UA NEGATIVE NEGATIVE mg/dL   Hgb urine dipstick NEGATIVE NEGATIVE   Bilirubin Urine NEGATIVE NEGATIVE   Ketones, ur NEGATIVE NEGATIVE mg/dL   Protein, ur 30 (A) NEGATIVE mg/dL   Nitrite NEGATIVE NEGATIVE   Leukocytes,Ua TRACE (A) NEGATIVE   RBC / HPF 0-5 0 - 5 RBC/hpf   WBC, UA 11-20 0 - 5 WBC/hpf   Bacteria, UA NONE SEEN NONE SEEN   Squamous Epithelial / LPF 0-5 0 - 5 /HPF   Mucus PRESENT    Hyaline Casts, UA PRESENT     Comment: Performed at [facility], [place] [address] [place], [place] [address]  CBC with  Differential     Status: Abnormal   Collection Time: 04/29/22  4:05 PM  Result Value Ref Range   WBC 10.3 4.0 - 10.5 K/uL   RBC 2.75 (L) 3.87 - 5.11 MIL/uL   Hemoglobin 8.5 (L) 12.0 - 15.0 g/dL   HCT 27.8 (L) 36.0 - 46.0 %   MCV 101.1 (H) 80.0 - 100.0 fL   MCH 30.9 26.0 - 34.0 pg   MCHC 30.6 30.0 - 36.0 g/dL   RDW 16.8 (H) 11.5 - 15.5 %   Platelets PLATELET CLUMPS NOTED ON SMEAR, UNABLE TO ESTIMATE 150 - 400 K/uL   nRBC 0.2 0.0 - 0.2 %   Neutrophils Relative % 63 %   Neutro Abs 6.4 1.7 - 7.7 K/uL   Lymphocytes Relative 28 %   Lymphs Abs 2.9 0.7 - 4.0 K/uL   Monocytes Relative 8 %   Monocytes Absolute 0.9 0.1 - 1.0 K/uL   Eosinophils Relative 1 %   Eosinophils Absolute 0.1 0.0 - 0.5 K/uL   Basophils Relative 0 %   Basophils Absolute 0.0 0.0 - 0.1 K/uL   Immature Granulocytes 0 %   Abs Immature Granulocytes 0.04 0.00 - 0.07  K/uL   Polychromasia PRESENT    Ovalocytes PRESENT     Comment: Performed at [facility], [place] [address], [place], [place] [address]  Comprehensive metabolic panel     Status: Abnormal   Collection Time: 04/29/22  4:05 PM  Result Value Ref Range   Sodium 146 (H) 135 - 145 mmol/L   Potassium 5.3 (H) 3.5 - 5.1 mmol/L   Chloride 112 (H) 98 - 111 mmol/L   CO2 25 22 - 32 mmol/L   Glucose, Bld 127 (H) 70 - 99 mg/dL    Comment: Glucose reference range applies only to samples taken after fasting for at least 8 hours.   BUN 48 (H) 8 - 23 mg/dL   Creatinine, Ser 1.08 (H) 0.44 - 1.00 mg/dL   Calcium 9.8 8.9 - 10.3 mg/dL   Total Protein 6.5 6.5 - 8.1 g/dL   Albumin 3.9 3.5 - 5.0 g/dL   AST 24 15 - 41 U/L   ALT 14 0 - 44 U/L   Alkaline Phosphatase 50 38 - 126 U/L   Total Bilirubin 1.0 0.3 - 1.2 mg/dL   GFR, Estimated 47 (L) >60 mL/min    Comment: (NOTE) Calculated using the CKD-EPI Creatinine Equation (2021)    Anion gap 9 5 - 15    Comment: Performed at [facility], [place] [address] [place], [place] [address]  Lipase, blood     Status: Abnormal   Collection Time: 04/29/22  4:05 PM  Result Value Ref Range   Lipase 53 (H) 11 - 51 U/L    Comment: Performed at [facility], [place] [address] [place],  [address]  POC occult blood, ED Provider will collect     Status: Abnormal   Collection Time: 04/29/22  9:32 PM  Result Value Ref Range   Fecal Occult Bld POSITIVE (A) NEGATIVE  CBC     Status: Abnormal   Collection Time: 04/29/22 10:55 PM  Result Value Ref Range   WBC 11.7 (H) 4.0 - 10.5 K/uL   RBC 2.40 (L) 3.87 - 5.11 MIL/uL   Hemoglobin 7.5 (L) 12.0 - 15.0 g/dL   HCT 24.1 (L) 36.0 - 46.0 %   MCV 100.4 (H) 80.0 - 100.0 fL   MCH 31.3 26.0 - 34.0 pg  MCHC 31.1 30.0 - 36.0 g/dL   RDW 16.5 (H) 11.5 - 15.5 %   Platelets 242 150 - 400 K/uL   nRBC 0.0 0.0 - 0.2 %    Comment: Performed at [facility], [place]  [address], [place], [place] [address]  Reticulocytes     Status: Abnormal   Collection Time: 04/29/22 10:55 PM  Result Value Ref Range   Retic Ct Pct 5.2 (H) 0.4 - 3.1 %   RBC. 2.42 (L) 3.87 - 5.11 MIL/uL   Retic Count, Absolute 125.8 19.0 - 186.0 K/uL   Immature Retic Fract 37.7 (H) 2.3 - 15.9 %    Comment: Performed at [facility], [place] [address] [place], [place] [address]  Type and screen [place]     Status: None   Collection Time: 04/29/22 10:56 PM  Result Value Ref Range   ABO/RH(D) O POS    Antibody Screen NEG    Sample Expiration      05/02/2022,2359 Performed at [facility], [place] [address], [place], [place] [address]   Vitamin B12     Status: None   Collection Time: 04/29/22 11:17 PM  Result Value Ref Range   Vitamin B-12 199 180 - 914 pg/mL    Comment: (NOTE) This assay is not validated for testing neonatal or myeloproliferative syndrome specimens for Vitamin B12 levels. Performed at [facility], [place] [address] [place], [place] [address]   Folate     Status: None   Collection Time: 04/29/22 11:17 PM  Result Value Ref Range   Folate 16.7 >5.9 ng/mL    Comment: Performed at [facility], [place] [address] [place], [place] [address]  Iron and TIBC     Status: Abnormal   Collection Time: 04/29/22 11:17 PM  Result Value Ref Range   Iron 21 (L) 28 - 170 ug/dL   TIBC 316 250 - 450 ug/dL   Saturation Ratios 7 (L) 10.4 - 31.8 %   UIBC 295 ug/dL    Comment: Performed at [facility], [place] [address], [place], [place] [address]  Ferritin     Status: None   Collection Time: 04/29/22 11:17 PM  Result Value Ref Range   Ferritin 43 11 - 307 ng/mL    Comment: Performed at [facility], [place] [address] [place], [place] [address]   US RENAL  Result Date: 04/29/2022 CLINICAL DATA:  Follow-up indeterminate renal lesions on same-day CT abdomen  and pelvis EXAM: RENAL / URINARY TRACT ULTRASOUND COMPLETE COMPARISON:  CT 04/29/2022 FINDINGS: Right Kidney: Renal measurements: 9.5 x 3.7 x 4.4 cm = volume: 81 mL. Two anechoic cysts in the posterior right kidney measure 1.5 and 1.2 cm respectively. No internal vascularity. A third cyst in the right kidney also is anechoic without internal vascularity and measures 1.6 cm. No hydronephrosis. Normal cortical echogenicity. Left Kidney: Renal measurements: 9.1 x 4.9 x 4.7 cm = volume: 110 mL. Anechoic cysts without internal vascularity in the left kidney measure 2.0 cm and 0.9 cm, respectively. Normal cortical echogenicity. No hydronephrosis. Bladder: Appears normal for degree of bladder distention. Other: None. IMPRESSION: Indeterminate lesion seen on same day CT abdomen and pelvis are compatible with benign simple cysts given ultrasound characteristics. No additional follow-up is recommended. Electronically Signed   By: Placido Sou M.D.   On: 04/29/2022 21:26   CT L-SPINE NO CHARGE  Result Date: 04/29/2022 CLINICAL DATA:  Initial evaluation for  acute abdominal pain, nausea, vomiting. EXAM: CT LUMBAR SPINE WITHOUT CONTRAST TECHNIQUE: Multidetector CT imaging of the lumbar spine was performed without intravenous contrast administration. Multiplanar CT image reconstructions were also generated. RADIATION DOSE REDUCTION: This exam was performed according to the departmental dose-optimization program which includes automated exposure control, adjustment of the mA and/or kV according to patient size and/or use of iterative reconstruction technique. COMPARISON:  Comparison made with prior radiograph from 06/13/2022 as well as previous. FINDINGS: Segmentation: Standard. Lowest well-formed disc space labeled the L5-S1 level. Alignment: Moderate dextroscoliosis, apex at L1-2. Slight rotatory component again noted. 4 mm retrolisthesis of L1 on L2, 5 mm retrolisthesis of L2 on L3, 5 mm anterolisthesis of L4 on L5 and  L5 on S1. Findings chronic and degenerative. Vertebrae: Subtle concavity at the superior endplate of L5 with subchondral gas lucency, age indeterminate, and could reflect a subtle acute to subacute compression fracture. Otherwise, vertebral body height relatively well maintained with no other acute or recent fracture. Visualized sacrum and pelvis intact. No worrisome osseous lesions. Paraspinal and other soft tissues: Paraspinous soft tissues demonstrate no acute finding. Cardiomegaly noted. Advanced aorto bi-iliac atherosclerotic disease. Moderate retained stool noted within the distal colon/rectum, suggesting constipation. Intermediate density right renal lesions again noted, indeterminate. Disc levels: L1-2: Retrolisthesis. Degenerative intervertebral disc space narrowing with diffuse disc bulge and disc desiccation. Reactive endplate spurring. Mild facet hypertrophy. No significant canal stenosis. Foramina remain patent. L2-3: Retrolisthesis with advanced degenerative intervertebral disc space narrowing. Diffuse disc bulge with reactive endplate spurring, worse on the left. Bilateral facet hypertrophy. No spinal stenosis. Moderate left L2 foraminal narrowing. Right neural foramen remains patent. L3-4: Degenerative disc space narrowing with diffuse disc bulge and disc desiccation. Reactive endplate spurring. Moderate facet hypertrophy. Resultant mild canal with moderate bilateral subarticular stenosis. Moderate right with mild left L3 foraminal narrowing. L4-5: Anterolisthesis. Disc bulge with disc desiccation. Superimposed right foraminal disc protrusion closely approximates the exiting right L4 nerve root. Moderate facet arthrosis. Resultant mild left with moderate right lateral recess stenosis. Mild left with moderate right L4 foraminal narrowing. L5-S1: Anterolisthesis. Disc bulge with disc desiccation. Reactive endplate spurring. Moderate to advanced facet arthrosis. No significant spinal stenosis. Moderate  right with mild left L5 foraminal narrowing. IMPRESSION: 1. Subtle concavity at the superior endplate of L5, age indeterminate, but could reflect a subtle acute to subacute compression fracture. Correlation with physical exam for possible pain at this location recommended. 2. No other acute osseous abnormality within the lumbar spine. 3. Moderate dextroscoliosis with advanced multilevel degenerative spondylosis and facet arthrosis as above. Resultant mild canal with moderate bilateral subarticular stenosis at L3-4. 4. Right foraminal disc protrusion at L4-5, potentially affecting the right L4 nerve root. 5. Intermediate density lesions within the right kidney, indeterminate. While these findings could reflect proteinaceous and/or hemorrhagic cyst, possible renal neoplasms could also have this appearance. Further evaluation with dedicated renal mass protocol CT and/or MRI suggested for further evaluation. Aortic Atherosclerosis (ICD10-I70.0). Electronically Signed   By: Jeannine Boga M.D.   On: 04/29/2022 20:16   CT ABDOMEN PELVIS W CONTRAST  Result Date: 04/29/2022 CLINICAL DATA:  Acute nonlocalized abdominal pain with nausea and weakness for 3 days. EXAM: CT ABDOMEN AND PELVIS WITH CONTRAST TECHNIQUE: Multidetector CT imaging of the abdomen and pelvis was performed using the standard protocol following bolus administration of intravenous contrast. RADIATION DOSE REDUCTION: This exam was performed according to the departmental dose-optimization program which includes automated exposure control, adjustment of the mA and/or kV according to patient  size and/or use of iterative reconstruction technique. CONTRAST:  7m OMNIPAQUE IOHEXOL 300 MG/ML  SOLN COMPARISON:  October 10, 2015. FINDINGS: Lower chest: Basilar atelectasis and septal thickening. Mild bronchiectatic changes at the LEFT lung base. Cardiomegaly. Coronary artery disease calcification of RIGHT coronary circulation. No chest wall abnormality.  Hepatobiliary: Lobular hepatic contours. No focal, suspicious hepatic lesion or pericholecystic stranding. No biliary duct dilation. Elongated RIGHT hepatic lobe may represent RIGHT else lobe. Pancreas: Normal, without mass, inflammation or ductal dilatation. Spleen: Normal. Adrenals/Urinary Tract: Adrenal glands are normal. No hydronephrosis or perinephric stranding. Cyst arises from the lower pole the LEFT kidney measuring water density and 1.8 cm. Along the posterior hilar lip of the LEFT kidney is a 69 Hounsfield unit 1.1 cm lesion that is well-circumscribed and slightly enlarged where it measured 9 mm on the prior study. Intermediate density lesions arising from the lateral cortex of the RIGHT kidney also appears slightly increased in size, largest measuring 15 mm (image 12/2 with a density of 70 Hounsfield units previously displaying a density of 56 Hounsfield units on noncontrast imaging. There is an increase in number of intermediate density lesions along the lateral cortex of the RIGHT kidney since previous imaging approximately 4 additional lesions are seen on images 51 and 55 of series 4 No definite ureteral calculi or hydronephrosis. Urinary bladder is collapsed limiting assessment. There are phleboliths in the pelvis which are similar to prior imaging. Stomach/Bowel: Stomach is under distended limiting evaluation. Question antral thickening and distal body thickening and irregularity which persists from initial to the late phase. Small bowel without dilation or signs of inflammation. Colon without dilation or inflammation stool in the rectum only small volume. Appendix not visualized but no secondary signs that would suggest acute appendicitis Vascular/Lymphatic: Aortic atherosclerosis. No sign of aneurysm. Smooth contour of the IVC. There is no gastrohepatic or hepatoduodenal ligament lymphadenopathy. No retroperitoneal or mesenteric lymphadenopathy. No pelvic sidewall lymphadenopathy. Atherosclerosis  is moderate to marked. Reproductive: Post hysterectomy. Other: No ascites.  No pneumoperitoneum. Musculoskeletal: Rotary dextroconvex scoliotic curvature centered about the L1 vertebral level similar to previous imaging. No acute musculoskeletal findings. Retrolisthesis of L1 on L2 and L2 on L3 is moderate and similar to prior imaging as well. IMPRESSION: 1. Question of antral thickening and distal gastric body thickening and irregularity which persists from initial to the late phase. Correlate with any symptoms of gastritis or peptic ulcer disease. 2. Intermediate density lesions arising from the lateral cortex of the RIGHT kidney also appears slightly increased in size since previous imaging. These may represent hemorrhagic or proteinaceous cysts but are incompletely characterized not allowing for exclusion of small solid renal neoplasms. Consider follow-up renal sonogram for further evaluation. 3. Cardiomegaly with coronary artery disease calcification of RIGHT coronary circulation. 4. Aortic atherosclerosis. 5. Rotary dextroconvex scoliotic curvature centered about the L1 vertebral level similar to previous imaging. Aortic Atherosclerosis (ICD10-I70.0). Electronically Signed   By: GZetta BillsM.D.   On: 04/29/2022 18:40    Pending Labs Unresulted Labs (From admission, onward)     Start     Ordered   04/30/22 09233 Basic metabolic panel  Daily,   R      04/29/22 2219   04/29/22 2345  ABO/Rh  Once,   R        04/29/22 2345   04/29/22 2219  CBC  Now then every 6 hours,   R      04/29/22 2219  Vitals/Pain Today's Vitals   04/29/22 2259 04/29/22 2302 04/29/22 2310 04/29/22 2344  BP:      Pulse:      Resp:      Temp:   98 F (36.7 C)   TempSrc:   Oral   SpO2: 94%     Weight:      Height:      PainSc:  5   2     Isolation Precautions No active isolations  Medications Medications  sodium chloride flush (NS) 0.9 % injection 3 mL (3 mLs Intravenous Given 04/29/22 2241)   pantoprazole (PROTONIX) injection 40 mg (has no administration in time range)  acetaminophen (TYLENOL) tablet 650 mg (has no administration in time range)    Or  acetaminophen (TYLENOL) suppository 650 mg (has no administration in time range)  ondansetron (ZOFRAN) tablet 4 mg (has no administration in time range)    Or  ondansetron (ZOFRAN) injection 4 mg (has no administration in time range)  iohexol (OMNIPAQUE) 300 MG/ML solution 80 mL (80 mLs Intravenous Contrast Given 04/29/22 1744)  acetaminophen (TYLENOL) tablet 650 mg (650 mg Oral Given 04/29/22 2313)  pantoprazole (PROTONIX) 80 mg /NS 100 mL IVPB (0 mg Intravenous Stopped 04/30/22 0023)  sodium chloride 0.9 % bolus 1,000 mL (0 mLs Intravenous Stopped 04/30/22 0024)    Mobility walks with person assist

## 2022-05-01 ENCOUNTER — Encounter (HOSPITAL_COMMUNITY)
Admission: EM | Disposition: A | Discharge status: Home Health | Payer: Self-pay | Source: Home / Self Care | Attending: Emergency Medicine

## 2022-05-01 ENCOUNTER — Observation Stay (HOSPITAL_COMMUNITY): Payer: Medicare HMO | Admitting: Anesthesiology

## 2022-05-01 ENCOUNTER — Encounter (HOSPITAL_COMMUNITY): Payer: Self-pay | Admitting: Family Medicine

## 2022-05-01 DIAGNOSIS — K259 Gastric ulcer, unspecified as acute or chronic, without hemorrhage or perforation: Secondary | ICD-10-CM

## 2022-05-01 DIAGNOSIS — N189 Chronic kidney disease, unspecified: Secondary | ICD-10-CM | POA: Diagnosis not present

## 2022-05-01 DIAGNOSIS — K449 Diaphragmatic hernia without obstruction or gangrene: Secondary | ICD-10-CM | POA: Diagnosis not present

## 2022-05-01 DIAGNOSIS — K254 Chronic or unspecified gastric ulcer with hemorrhage: Secondary | ICD-10-CM | POA: Diagnosis not present

## 2022-05-01 DIAGNOSIS — D62 Acute posthemorrhagic anemia: Secondary | ICD-10-CM

## 2022-05-01 DIAGNOSIS — S32050A Wedge compression fracture of fifth lumbar vertebra, initial encounter for closed fracture: Secondary | ICD-10-CM | POA: Diagnosis not present

## 2022-05-01 DIAGNOSIS — I129 Hypertensive chronic kidney disease with stage 1 through stage 4 chronic kidney disease, or unspecified chronic kidney disease: Secondary | ICD-10-CM

## 2022-05-01 DIAGNOSIS — K922 Gastrointestinal hemorrhage, unspecified: Secondary | ICD-10-CM | POA: Diagnosis not present

## 2022-05-01 DIAGNOSIS — D631 Anemia in chronic kidney disease: Secondary | ICD-10-CM

## 2022-05-01 DIAGNOSIS — K921 Melena: Secondary | ICD-10-CM | POA: Diagnosis not present

## 2022-05-01 DIAGNOSIS — K3189 Other diseases of stomach and duodenum: Secondary | ICD-10-CM

## 2022-05-01 DIAGNOSIS — D509 Iron deficiency anemia, unspecified: Secondary | ICD-10-CM | POA: Diagnosis not present

## 2022-05-01 HISTORY — PX: ESOPHAGOGASTRODUODENOSCOPY (EGD) WITH PROPOFOL: SHX5813

## 2022-05-01 HISTORY — PX: BIOPSY: SHX5522

## 2022-05-01 LAB — CBC
HCT: 25.9 % — ABNORMAL LOW (ref 36.0–46.0)
Hemoglobin: 7.9 g/dL — ABNORMAL LOW (ref 12.0–15.0)
MCH: 31 pg (ref 26.0–34.0)
MCHC: 30.5 g/dL (ref 30.0–36.0)
MCV: 101.6 fL — ABNORMAL HIGH (ref 80.0–100.0)
Platelets: 272 10*3/uL (ref 150–400)
RBC: 2.55 MIL/uL — ABNORMAL LOW (ref 3.87–5.11)
RDW: 17.1 % — ABNORMAL HIGH (ref 11.5–15.5)
WBC: 11.5 10*3/uL — ABNORMAL HIGH (ref 4.0–10.5)
nRBC: 0.2 % (ref 0.0–0.2)

## 2022-05-01 LAB — BASIC METABOLIC PANEL
Anion gap: 6 (ref 5–15)
BUN: 33 mg/dL — ABNORMAL HIGH (ref 8–23)
CO2: 23 mmol/L (ref 22–32)
Calcium: 8.7 mg/dL — ABNORMAL LOW (ref 8.9–10.3)
Chloride: 112 mmol/L — ABNORMAL HIGH (ref 98–111)
Creatinine, Ser: 0.97 mg/dL (ref 0.44–1.00)
GFR, Estimated: 53 mL/min — ABNORMAL LOW (ref >60)
Glucose, Bld: 95 mg/dL (ref 70–99)
Potassium: 4 mmol/L (ref 3.5–5.1)
Sodium: 141 mmol/L (ref 135–145)

## 2022-05-01 SURGERY — ESOPHAGOGASTRODUODENOSCOPY (EGD) WITH PROPOFOL
Anesthesia: Monitor Anesthesia Care

## 2022-05-01 MED ORDER — PROPOFOL 10 MG/ML IV BOLUS
INTRAVENOUS | Status: DC | PRN
  Administered 2022-05-01 (×4): 20 mg via INTRAVENOUS

## 2022-05-01 MED ORDER — LACTATED RINGERS IV SOLN: INTRAVENOUS | Status: DC

## 2022-05-01 MED ORDER — FERROUS SULFATE 325 (65 FE) MG PO TABS: 325.0000 mg | ORAL_TABLET | Freq: Every day | ORAL | 3 refills | Status: AC

## 2022-05-01 MED ORDER — LIDOCAINE HCL (CARDIAC) PF 100 MG/5ML IV SOSY
PREFILLED_SYRINGE | INTRAVENOUS | Status: DC | PRN
  Administered 2022-05-01: 100 mg via INTRAVENOUS

## 2022-05-01 SURGICAL SUPPLY — 15 items
BLOCK BITE 60FR ADLT L/F BLUE (MISCELLANEOUS) ×2 IMPLANT
ELECT REM PT RETURN 9FT ADLT (ELECTROSURGICAL)
ELECTRODE REM PT RTRN 9FT ADLT (ELECTROSURGICAL) IMPLANT
FORCEP RJ3 GP 1.8X160 W-NEEDLE (CUTTING FORCEPS) IMPLANT
FORCEPS BIOP RAD 4 LRG CAP 4 (CUTTING FORCEPS) IMPLANT
NDL SCLEROTHERAPY 25GX240 (NEEDLE) IMPLANT
NEEDLE SCLEROTHERAPY 25GX240 (NEEDLE) IMPLANT
PROBE APC STR FIRE (PROBE) IMPLANT
PROBE INJECTION GOLD (MISCELLANEOUS)
PROBE INJECTION GOLD 7FR (MISCELLANEOUS) IMPLANT
SNARE SHORT THROW 13M SML OVAL (MISCELLANEOUS) IMPLANT
SYR 50ML LL SCALE MARK (SYRINGE) IMPLANT
TUBING ENDO SMARTCAP PENTAX (MISCELLANEOUS) ×4 IMPLANT
TUBING IRRIGATION ENDOGATOR (MISCELLANEOUS) ×3 IMPLANT
WATER STERILE IRR 1000ML POUR (IV SOLUTION) IMPLANT

## 2022-05-01 NOTE — Progress Notes (Signed)
[facility] PROGRESS NOTE   Brianna Bush QVZ:[id number] DOB: [birth year] DOA: 04/29/2022  PCP: Lurline Del, DO  Brief History/Interval Summary: 88 y.o. female with medical history significant for hypertension, hyperlipidemia, GERD, and CKD 3A who presented to emergency department with abdominal pain, back pain, nausea, and general weakness. Patient began experiencing pain in her back 3 days ago that she was attributing to moving a Christmas tree, but also began to have some pain in the upper abdomen.  She denies any nausea or vomiting associated with this and has not noticed any melena or hematochezia.  She reports using Advil occasionally as needed, took 40 mg of Advil yesterday, but reports that she never takes more than 1 dose per day and does not use more than 400 mg at a time. CT of the abdomen pelvis raises question of antral and distal gastric body thickening.  Fecal occult blood testing is positive.  Stool appeared melanotic per ED PA.  Consultants: Gastroenterology  Procedures: None yet    Subjective/Interval History: Patient frustrated that she has not been able to get out of the bed on her own.  Denies any abdominal pain nausea or vomiting.  Back pain is stable.    Assessment/Plan:  Concern for GI bleed/acute blood loss anemia/iron deficiency CT scan raise concern for peptic ulcer disease.  Patient has been taking Advil for back pain.  Hemoglobin was 15 in 2017 and apparently had unremarkable blood work in October 2023.  Presented with hemoglobin of 8.5.  Stool was Hemoccult positive.  Stool appeared to be melanotic per ED provider. Patient currently on PPI.  Gastroenterology has seen the patient.  Plan is for upper endoscopy today. Hemoglobin stable for the most part.  Transfuse if it drops below 7. Patient told to avoid NSAIDs. Anemia panel reviewed.  Ferritin 43, iron 21, TIBC 316, percent saturation 7.  Iron infusion has been ordered.  Will benefit from iron  supplementation at discharge.    Low B12 level noted.  See below  Back pain/L5 compression fracture Mildly tender to palpation.  No neurological weakness.  PT and OT evaluation.  Pain does not appear to be severe.  Will try conservative measures first.  Chronic kidney disease stage IIIa/hyperkalemia Stable.  Monitor urine output.  Avoid nephrotoxic agents.   Potassium level noted to be normal today.  B12 deficiency Vitamin B12 level 199.  She has been started on B12 supplementation.  Folic acid level is normal at 16.7.  Right kidney lesions Noted to be benign cysts on ultrasound.  No further workup.   DVT Prophylaxis: SCDs Code Status: Full code Family Communication: Discussed with the patient.  No family at bedside Disposition Plan: Hopefully return home when improved.  Status is: Observation The patient will require care spanning > 2 midnights and should be moved to inpatient because: Need to monitor hemoglobin due to concern for GI bleed.      Medications: Scheduled:  [MAR Hold] vitamin B-12  1,000 mcg Oral Daily   [MAR Hold] pantoprazole (PROTONIX) IV  40 mg Intravenous Q12H   [MAR Hold] sodium chloride flush  3 mL Intravenous Q12H   Continuous:  sodium chloride 20 mL/hr at 05/01/22 0546   [MAR Hold] ferric gluconate (FERRLECIT) IVPB 250 mg (04/30/22 1248)   lactated ringers 10 mL/hr at 05/01/22 0959   PRN:[MAR Hold] acetaminophen **OR** [MAR Hold] acetaminophen, [MAR Hold] ondansetron **OR** [MAR Hold] ondansetron (ZOFRAN) IV  Antibiotics: Anti-infectives (From admission, onward)    None  Objective:  Vital Signs  Vitals:   04/30/22 1323 04/30/22 1954 05/01/22 0512 05/01/22 0931  BP: (!) 135/59 108/67 90/67 (!) 159/69  Pulse: 69 89 86 78  Resp: '16 18 17   '$ Temp: 97.8 F (36.6 C) 97.6 F (36.4 C) (!) 97.4 F (36.3 C) 98.8 F (37.1 C)  TempSrc: Oral  Oral Temporal  SpO2: 99% 99% 99% 98%  Weight:      Height:        Intake/Output Summary (Last  24 hours) at 05/01/2022 1020 Last data filed at 05/01/2022 0904 Gross per 24 hour  Intake 574.57 ml  Output --  Net 574.57 ml    Filed Weights   04/29/22 1559  Weight: 68 kg    General appearance: Awake alert.  In no distress Resp: Clear to auscultation bilaterally.  Normal effort Cardio: S1-S2 is normal regular.  No S3-S4.  No rubs murmurs or bruit GI: Abdomen is soft.  Nontender nondistended.  Bowel sounds are present normal.  No masses organomegaly Extremities: No edema.  Able to move both of her lower extremities   Lab Results:  Data Reviewed: I have personally reviewed following labs and reports of the imaging studies  CBC: Recent Labs  Lab 04/29/22 1605 04/29/22 2255 04/30/22 0328 04/30/22 1039 04/30/22 1559 05/01/22 0116  WBC 10.3 11.7* 10.8* 10.8* 11.0* 11.5*  NEUTROABS 6.4  --   --   --   --   --   HGB 8.5* 7.5* 7.2* 7.4* 7.4* 7.9*  HCT 27.8* 24.1* 22.9* 23.9* 24.1* 25.9*  MCV 101.1* 100.4* 99.6 101.3* 102.1* 101.6*  PLT PLATELET CLUMPS NOTED ON SMEAR, UNABLE TO ESTIMATE 242 246 253 265 272     Basic Metabolic Panel: Recent Labs  Lab 04/29/22 1605 04/30/22 0328 05/01/22 0116  NA 146* 143 141  K 5.3* 4.1 4.0  CL 112* 113* 112*  CO2 '25 23 23  '$ GLUCOSE 127* 99 95  BUN 48* 40* 33*  CREATININE 1.08* 0.93 0.97  CALCIUM 9.8 8.4* 8.7*     GFR: Estimated Creatinine Clearance: 30.7 mL/min (by C-G formula based on SCr of 0.97 mg/dL).  Liver Function Tests: Recent Labs  Lab 04/29/22 1605  AST 24  ALT 14  ALKPHOS 50  BILITOT 1.0  PROT 6.5  ALBUMIN 3.9     Recent Labs  Lab 04/29/22 1605  LIPASE 53*      Anemia Panel: Recent Labs    04/29/22 2255 04/29/22 2317  VITAMINB12  --  199  FOLATE  --  16.7  FERRITIN  --  43  TIBC  --  316  IRON  --  21*  RETICCTPCT 5.2*  --       Radiology Studies: US RENAL  Result Date: 04/29/2022 CLINICAL DATA:  Follow-up indeterminate renal lesions on same-day CT abdomen and pelvis EXAM: RENAL  / URINARY TRACT ULTRASOUND COMPLETE COMPARISON:  CT 04/29/2022 FINDINGS: Right Kidney: Renal measurements: 9.5 x 3.7 x 4.4 cm = volume: 81 mL. Two anechoic cysts in the posterior right kidney measure 1.5 and 1.2 cm respectively. No internal vascularity. A third cyst in the right kidney also is anechoic without internal vascularity and measures 1.6 cm. No hydronephrosis. Normal cortical echogenicity. Left Kidney: Renal measurements: 9.1 x 4.9 x 4.7 cm = volume: 110 mL. Anechoic cysts without internal vascularity in the left kidney measure 2.0 cm and 0.9 cm, respectively. Normal cortical echogenicity. No hydronephrosis. Bladder: Appears normal for degree of bladder distention. Other: None. IMPRESSION: Indeterminate lesion seen on  same day CT abdomen and pelvis are compatible with benign simple cysts given ultrasound characteristics. No additional follow-up is recommended. Electronically Signed   By: Placido Sou M.D.   On: 04/29/2022 21:26   CT L-SPINE NO CHARGE  Result Date: 04/29/2022 CLINICAL DATA:  Initial evaluation for acute abdominal pain, nausea, vomiting. EXAM: CT LUMBAR SPINE WITHOUT CONTRAST TECHNIQUE: Multidetector CT imaging of the lumbar spine was performed without intravenous contrast administration. Multiplanar CT image reconstructions were also generated. RADIATION DOSE REDUCTION: This exam was performed according to the departmental dose-optimization program which includes automated exposure control, adjustment of the mA and/or kV according to patient size and/or use of iterative reconstruction technique. COMPARISON:  Comparison made with prior radiograph from 06/13/2022 as well as previous. FINDINGS: Segmentation: Standard. Lowest well-formed disc space labeled the L5-S1 level. Alignment: Moderate dextroscoliosis, apex at L1-2. Slight rotatory component again noted. 4 mm retrolisthesis of L1 on L2, 5 mm retrolisthesis of L2 on L3, 5 mm anterolisthesis of L4 on L5 and L5 on S1. Findings  chronic and degenerative. Vertebrae: Subtle concavity at the superior endplate of L5 with subchondral gas lucency, age indeterminate, and could reflect a subtle acute to subacute compression fracture. Otherwise, vertebral body height relatively well maintained with no other acute or recent fracture. Visualized sacrum and pelvis intact. No worrisome osseous lesions. Paraspinal and other soft tissues: Paraspinous soft tissues demonstrate no acute finding. Cardiomegaly noted. Advanced aorto bi-iliac atherosclerotic disease. Moderate retained stool noted within the distal colon/rectum, suggesting constipation. Intermediate density right renal lesions again noted, indeterminate. Disc levels: L1-2: Retrolisthesis. Degenerative intervertebral disc space narrowing with diffuse disc bulge and disc desiccation. Reactive endplate spurring. Mild facet hypertrophy. No significant canal stenosis. Foramina remain patent. L2-3: Retrolisthesis with advanced degenerative intervertebral disc space narrowing. Diffuse disc bulge with reactive endplate spurring, worse on the left. Bilateral facet hypertrophy. No spinal stenosis. Moderate left L2 foraminal narrowing. Right neural foramen remains patent. L3-4: Degenerative disc space narrowing with diffuse disc bulge and disc desiccation. Reactive endplate spurring. Moderate facet hypertrophy. Resultant mild canal with moderate bilateral subarticular stenosis. Moderate right with mild left L3 foraminal narrowing. L4-5: Anterolisthesis. Disc bulge with disc desiccation. Superimposed right foraminal disc protrusion closely approximates the exiting right L4 nerve root. Moderate facet arthrosis. Resultant mild left with moderate right lateral recess stenosis. Mild left with moderate right L4 foraminal narrowing. L5-S1: Anterolisthesis. Disc bulge with disc desiccation. Reactive endplate spurring. Moderate to advanced facet arthrosis. No significant spinal stenosis. Moderate right with mild  left L5 foraminal narrowing. IMPRESSION: 1. Subtle concavity at the superior endplate of L5, age indeterminate, but could reflect a subtle acute to subacute compression fracture. Correlation with physical exam for possible pain at this location recommended. 2. No other acute osseous abnormality within the lumbar spine. 3. Moderate dextroscoliosis with advanced multilevel degenerative spondylosis and facet arthrosis as above. Resultant mild canal with moderate bilateral subarticular stenosis at L3-4. 4. Right foraminal disc protrusion at L4-5, potentially affecting the right L4 nerve root. 5. Intermediate density lesions within the right kidney, indeterminate. While these findings could reflect proteinaceous and/or hemorrhagic cyst, possible renal neoplasms could also have this appearance. Further evaluation with dedicated renal mass protocol CT and/or MRI suggested for further evaluation. Aortic Atherosclerosis (ICD10-I70.0). Electronically Signed   By: Jeannine Boga M.D.   On: 04/29/2022 20:16   CT ABDOMEN PELVIS W CONTRAST  Result Date: 04/29/2022 CLINICAL DATA:  Acute nonlocalized abdominal pain with nausea and weakness for 3 days. EXAM: CT ABDOMEN AND PELVIS  WITH CONTRAST TECHNIQUE: Multidetector CT imaging of the abdomen and pelvis was performed using the standard protocol following bolus administration of intravenous contrast. RADIATION DOSE REDUCTION: This exam was performed according to the departmental dose-optimization program which includes automated exposure control, adjustment of the mA and/or kV according to patient size and/or use of iterative reconstruction technique. CONTRAST:  87m OMNIPAQUE IOHEXOL 300 MG/ML  SOLN COMPARISON:  October 10, 2015. FINDINGS: Lower chest: Basilar atelectasis and septal thickening. Mild bronchiectatic changes at the LEFT lung base. Cardiomegaly. Coronary artery disease calcification of RIGHT coronary circulation. No chest wall abnormality. Hepatobiliary: Lobular  hepatic contours. No focal, suspicious hepatic lesion or pericholecystic stranding. No biliary duct dilation. Elongated RIGHT hepatic lobe may represent RIGHT else lobe. Pancreas: Normal, without mass, inflammation or ductal dilatation. Spleen: Normal. Adrenals/Urinary Tract: Adrenal glands are normal. No hydronephrosis or perinephric stranding. Cyst arises from the lower pole the LEFT kidney measuring water density and 1.8 cm. Along the posterior hilar lip of the LEFT kidney is a 69 Hounsfield unit 1.1 cm lesion that is well-circumscribed and slightly enlarged where it measured 9 mm on the prior study. Intermediate density lesions arising from the lateral cortex of the RIGHT kidney also appears slightly increased in size, largest measuring 15 mm (image 12/2 with a density of 70 Hounsfield units previously displaying a density of 56 Hounsfield units on noncontrast imaging. There is an increase in number of intermediate density lesions along the lateral cortex of the RIGHT kidney since previous imaging approximately 4 additional lesions are seen on images 51 and 55 of series 4 No definite ureteral calculi or hydronephrosis. Urinary bladder is collapsed limiting assessment. There are phleboliths in the pelvis which are similar to prior imaging. Stomach/Bowel: Stomach is under distended limiting evaluation. Question antral thickening and distal body thickening and irregularity which persists from initial to the late phase. Small bowel without dilation or signs of inflammation. Colon without dilation or inflammation stool in the rectum only small volume. Appendix not visualized but no secondary signs that would suggest acute appendicitis Vascular/Lymphatic: Aortic atherosclerosis. No sign of aneurysm. Smooth contour of the IVC. There is no gastrohepatic or hepatoduodenal ligament lymphadenopathy. No retroperitoneal or mesenteric lymphadenopathy. No pelvic sidewall lymphadenopathy. Atherosclerosis is moderate to marked.  Reproductive: Post hysterectomy. Other: No ascites.  No pneumoperitoneum. Musculoskeletal: Rotary dextroconvex scoliotic curvature centered about the L1 vertebral level similar to previous imaging. No acute musculoskeletal findings. Retrolisthesis of L1 on L2 and L2 on L3 is moderate and similar to prior imaging as well. IMPRESSION: 1. Question of antral thickening and distal gastric body thickening and irregularity which persists from initial to the late phase. Correlate with any symptoms of gastritis or peptic ulcer disease. 2. Intermediate density lesions arising from the lateral cortex of the RIGHT kidney also appears slightly increased in size since previous imaging. These may represent hemorrhagic or proteinaceous cysts but are incompletely characterized not allowing for exclusion of small solid renal neoplasms. Consider follow-up renal sonogram for further evaluation. 3. Cardiomegaly with coronary artery disease calcification of RIGHT coronary circulation. 4. Aortic atherosclerosis. 5. Rotary dextroconvex scoliotic curvature centered about the L1 vertebral level similar to previous imaging. Aortic Atherosclerosis (ICD10-I70.0). Electronically Signed   By: GZetta BillsM.D.   On: 04/29/2022 18:40       LOS: 0 days   [facility] Pager on [email or web address]  05/01/2022, 10:20 AM

## 2022-05-01 NOTE — Transfer of Care (Signed)
Immediate Anesthesia Transfer of Care Note  Patient: Brianna Bush  Procedure(s) Performed: ESOPHAGOGASTRODUODENOSCOPY (EGD) WITH PROPOFOL BIOPSY  Patient Location: PACU and Endoscopy Unit  Anesthesia Type:MAC  Level of Consciousness: awake, alert , and oriented  Airway & Oxygen Therapy: Patient Spontanous Breathing and Patient connected to face mask oxygen  Post-op Assessment: Report given to RN and Post -op Vital signs reviewed and stable  Post vital signs: Reviewed and stable  Last Vitals:  Vitals Value Taken Time  BP 105/45 05/01/22 1022  Temp 36.3 C 05/01/22 1022  Pulse 70 05/01/22 1024  Resp 19 05/01/22 1024  SpO2 100 % 05/01/22 1024  Vitals shown include unvalidated device data.  Last Pain:  Vitals:   05/01/22 1022  TempSrc: Temporal  PainSc: 0-No pain         Complications: No notable events documented.

## 2022-05-01 NOTE — Evaluation (Signed)
Physical Therapy Evaluation Patient Details Name: Brianna Bush MRN: [id number] DOB: 1924/04/16 Today's Date: 05/01/2022  History of Present Illness  88 y.o. female presented after experiencing pain in her back 3 days ago that she was attributing to moving a Christmas tree, but also began to have some pain in the upper abdomen.  CT of the abdomen pelvis raises question of antral and distal gastric body thickening. Fecal occult blood testing is positive. Patient admitted with concern for GI bleed/acute blood loss anemia/iron deficiency. Pt also noted to have L5 compression fracture. PMH significant for hypertension, hyperlipidemia, GERD, and CKD 3A who presented to emergency department with abdominal pain, back pain, nausea, and general weakness.    Clinical Impression  Brianna Bush is 88 y.o. female admitted with above HPI and diagnosis. Patient is currently limited by functional impairments below (see PT problem list). Patient lives alone and is independent at baseline. Currently she requires min assist for transfers and gait with HHA and RW. Pt's balance improved with bil UE support on RW as she tends to reach for external support if only provided single UE support. Patient will benefit from continued skilled PT interventions to address impairments and progress independence with mobility, recommending HHPT with return home with assist from family. Acute PT will follow and progress as able.        Recommendations for follow up therapy are one component of a multi-disciplinary discharge planning process, led by the attending physician.  Recommendations may be updated based on patient status, additional functional criteria and insurance authorization.  Follow Up Recommendations Home health PT      Assistance Recommended at Discharge Frequent or constant Supervision/Assistance  Patient can return home with the following  A little help with walking and/or transfers;A little help with  bathing/dressing/bathroom;Assistance with cooking/housework;Direct supervision/assist for medications management;Assist for transportation;Help with stairs or ramp for entrance    Equipment Recommendations Rolling walker (2 wheels)  Recommendations for Other Services       Functional Status Assessment Patient has had a recent decline in their functional status and demonstrates the ability to make significant improvements in function in a reasonable and predictable amount of time.     Precautions / Restrictions Precautions Precautions: Fall Restrictions Weight Bearing Restrictions: No      Mobility  Bed Mobility Overal bed mobility: Needs Assistance Bed Mobility: Supine to Sit     Supine to sit: Min assist     General bed mobility comments: HOB elevated and pt using bed rail, min assist to fully raise trunk. pt able to scoot to EOB.    Transfers Overall transfer level: Needs assistance Equipment used: Rolling walker (2 wheels) Transfers: Sit to/from Stand Sit to Stand: Min assist           General transfer comment: min assist to power up from EOB, toilet, and recliner. pt using bil UE to rise/initiate.    Ambulation/Gait Ambulation/Gait assistance: Min assist, Min guard Gait Distance (Feet): 200 Feet Assistive device: Rolling walker (2 wheels), 1 person hand held assist Gait Pattern/deviations: Step-through pattern, Decreased stride length, Shuffle, Trunk flexed Gait velocity: decr     General Gait Details: min assist with HHA for ambulation in room to bathroom. pt reaching for external support of bed, wall, door, sink, etc. RW provided and pt's balance improved. min guard with cues for direction of gait throughout.  Stairs            Wheelchair Mobility    Modified Rankin (Stroke Patients  Only)       Balance Overall balance assessment: Needs assistance Sitting-balance support: Feet supported, Bilateral upper extremity supported Sitting  balance-Leahy Scale: Good     Standing balance support: Bilateral upper extremity supported, During functional activity, Reliant on assistive device for balance Standing balance-Leahy Scale: Poor Standing balance comment: reliant on external support                             Pertinent Vitals/Pain      Home Living Family/patient expects to be discharged to:: Private residence Living Arrangements: Alone Available Help at Discharge: Family Type of Home: House         Home Layout: One level;Laundry or work area in [place]: [place] held shower head;Grab bars - tub/shower;Grab bars - toilet;Cane - single point Additional Comments: pt's granddaughter comes over on Thursday - works through [employer] as home aid. Pt granddaugthers arrived EOS and report planning to take pt home with family.    Prior Function Prior Level of Function : Independent/Modified Independent             Mobility Comments: doesn't use SPC always       Hand Dominance        Extremity/Trunk Assessment   Upper Extremity Assessment Upper Extremity Assessment: Generalized weakness    Lower Extremity Assessment Lower Extremity Assessment: Generalized weakness    Cervical / Trunk Assessment Cervical / Trunk Assessment: Kyphotic  Communication   Communication: HOH (has hearign aids)  Cognition Arousal/Alertness: Awake/alert Behavior During Therapy: WFL for tasks assessed/performed Overall Cognitive Status: Within Functional Limits for tasks assessed                                 General Comments: slighlty confused on timeline but remember nmonday was Christmas, pt thinks she arrived on christmas.        General Comments      Exercises     Assessment/Plan    PT Assessment Patient needs continued PT services  PT Problem List Decreased balance;Decreased mobility;Decreased range of motion;Decreased activity tolerance;Decreased  strength;Decreased knowledge of use of DME;Decreased safety awareness;Decreased knowledge of precautions;Pain       PT Treatment Interventions DME instruction;Therapeutic exercise;Therapeutic activities;Functional mobility training;Stair training;Gait training;Balance training;Patient/family education    PT Goals (Current goals can be found in the Care Plan section)  Acute Rehab PT Goals PT Goal Formulation: With patient Time For Goal Achievement: 05/15/22 Potential to Achieve Goals: Good    Frequency Min 3X/week     Co-evaluation               AM-PAC PT "6 Clicks" Mobility  Outcome Measure Help needed turning from your back to your side while in a flat bed without using bedrails?: A Little Help needed moving from lying on your back to sitting on the side of a flat bed without using bedrails?: A Little Help needed moving to and from a bed to a chair (including a wheelchair)?: A Little Help needed standing up from a chair using your arms (e.g., wheelchair or bedside chair)?: A Little Help needed to walk in hospital room?: A Little Help needed climbing 3-5 steps with a railing? : A Lot 6 Click Score: 17    End of Session Equipment Utilized During Treatment: Gait belt Activity Tolerance: Patient tolerated treatment well Patient left: in chair;with call bell/phone within reach;with family/visitor  present Nurse Communication: Mobility status PT Visit Diagnosis: Muscle weakness (generalized) (M62.81);Difficulty in walking, not elsewhere classified (R26.2)    Time: 8022-1798 PT Time Calculation (min) (ACUTE ONLY): 37 min   Charges:   PT Evaluation $PT Eval Moderate Complexity: 1 Mod PT Treatments $Gait Training: 8-22 mins        Verner Mould, DPT Acute Rehabilitation Services Office [phone]  05/01/22 4:11 PM

## 2022-05-01 NOTE — Op Note (Signed)
[facility] Patient Name: Brianna Bush Procedure Date: 05/01/2022 MRN: [id number] Attending MD: Clarene Essex , MD, [id number] Date of Birth: [birth year] CSN: [id number] Age: 88 Admit Type: Inpatient Procedure:                Upper GI endoscopy Indications:              Acute post hemorrhagic anemia, Melena Providers:                Clarene Essex, MD, Fanny Skates RN, RN, Benetta Spar, Technician Referring MD:              Medicines:                Monitored Anesthesia Care Complications:            No immediate complications. Estimated Blood Loss:     Estimated blood loss: none. Procedure:                Pre-Anesthesia Assessment:                           - Prior to the procedure, a History and Physical                            was performed, and patient medications and                            allergies were reviewed. The patient's tolerance of                            previous anesthesia was also reviewed. The risks                            and benefits of the procedure and the sedation                            options and risks were discussed with the patient.                            All questions were answered, and informed consent                            was obtained. Prior Anticoagulants: The patient has                            taken no anticoagulant or antiplatelet agents                            except for NSAID medication. ASA Grade Assessment:                            III - A patient with severe systemic disease. After  reviewing the risks and benefits, the patient was                            deemed in satisfactory condition to undergo the                            procedure.                           After obtaining informed consent, the endoscope was                            passed under direct vision. Throughout the                            procedure, the patient's  blood pressure, pulse, and                            oxygen saturations were monitored continuously. The                            [id number]-H190 ([id number]) Olympus endoscope was introduced                            through the mouth, and advanced to the second part                            of duodenum. The upper GI endoscopy was                            accomplished without difficulty. The patient                            tolerated the procedure well. Scope In: Scope Out: Findings:      A small hiatal hernia was present.      One non-bleeding moderately large cratered gastric ulcer with no       stigmata of bleeding was found in the gastric antrum. Biopsies were       taken with a cold forceps for histology.      A single localized medium erosion with no bleeding and no stigmata of       recent bleeding was found in the gastric antrum. Biopsies were taken       with a cold forceps for histology.      The duodenal bulb, first portion of the duodenum and second portion of       the duodenum were normal.      The exam was otherwise without abnormality. Impression:               - Small hiatal hernia.                           - Non-bleeding gastric ulcer with no stigmata of                            bleeding. Biopsied.                           -  Erosive gastropathy with no bleeding and no                            stigmata of recent bleeding. Biopsied.                           - Normal duodenal bulb, first portion of the                            duodenum and second portion of the duodenum.                           - The examination was otherwise normal. Moderate Sedation:      Not Applicable - Patient had care per Anesthesia. Recommendation:           - Soft diet today.                           - Continue present medications. If doing well                            tomorrow may switch to p.o. pump inhibitors and                            hopefully go home soon but consider  transfusion                           - Await pathology results.                           - Return to GI clinic in 2 weeks.                           - Telephone GI clinic for pathology results in 1                            week.                           - Telephone GI clinic if symptomatic PRN. No                            aspirin or nonsteroidals including her Aleve                            long-term Tylenol okay and only Procedure Code(s):        --- Professional ---                           [id number], Esophagogastroduodenoscopy, flexible,                            transoral; with biopsy, single or multiple Diagnosis Code(s):        --- Professional ---  K44.9, Diaphragmatic hernia without obstruction or                            gangrene                           K25.9, Gastric ulcer, unspecified as acute or                            chronic, without hemorrhage or perforation                           K31.89, Other diseases of stomach and duodenum                           D62, Acute posthemorrhagic anemia                           K92.1, Melena (includes Hematochezia) CPT copyright 2022 American [employer] Association. All rights reserved. The codes documented in this report are preliminary and upon coder review may  be revised to meet current compliance requirements. Clarene Essex, MD 05/01/2022 10:41:18 AM This report has been signed electronically. Number of Addenda: 0

## 2022-05-01 NOTE — Anesthesia Preprocedure Evaluation (Addendum)
Anesthesia Evaluation  Patient identified by MRN, date of birth, ID band Patient awake    Reviewed: Allergy & Precautions, H&P , NPO status , Patient's Chart, lab work & pertinent test results  Airway Mallampati: III  TM Distance: >3 FB Neck ROM: Full    Dental no notable dental hx. (+) Teeth Intact, Dental Advisory Given, Caps   Pulmonary neg pulmonary ROS   Pulmonary exam normal breath sounds clear to auscultation       Cardiovascular Exercise Tolerance: Good hypertension, Pt. on medications negative cardio ROS Normal cardiovascular exam Rhythm:Regular Rate:Normal     Neuro/Psych negative neurological ROS  negative psych ROS   GI/Hepatic negative GI ROS, Neg liver ROS,GERD  ,,  Endo/Other  negative endocrine ROS    Renal/GU CRFRenal diseasenegative Renal ROS  negative genitourinary   Musculoskeletal negative musculoskeletal ROS (+)    Abdominal   Peds negative pediatric ROS (+)  Hematology negative hematology ROS (+) Blood dyscrasia, anemia   Anesthesia Other Findings   Reproductive/Obstetrics negative OB ROS                             Anesthesia Physical Anesthesia Plan  ASA: 4  Anesthesia Plan: MAC   Post-op Pain Management: Minimal or no pain anticipated   Induction: Intravenous  PONV Risk Score and Plan: Propofol infusion  Airway Management Planned: Mask and Natural Airway  Additional Equipment: None  Intra-op Plan:   Post-operative Plan:   Informed Consent: I have reviewed the patients History and Physical, chart, labs and discussed the procedure including the risks, benefits and alternatives for the proposed anesthesia with the patient or authorized representative who has indicated his/her understanding and acceptance.       Plan Discussed with: Anesthesiologist and CRNA  Anesthesia Plan Comments:        Anesthesia Quick Evaluation

## 2022-05-01 NOTE — Anesthesia Postprocedure Evaluation (Signed)
Anesthesia Post Note  Patient: Brianna Bush  Procedure(s) Performed: ESOPHAGOGASTRODUODENOSCOPY (EGD) WITH PROPOFOL BIOPSY     Patient location during evaluation: PACU Anesthesia Type: MAC Level of consciousness: awake and alert Pain management: pain level controlled Vital Signs Assessment: post-procedure vital signs reviewed and stable Respiratory status: spontaneous breathing, nonlabored ventilation, respiratory function stable and patient connected to nasal cannula oxygen Cardiovascular status: stable and blood pressure returned to baseline Postop Assessment: no apparent nausea or vomiting Anesthetic complications: no  No notable events documented.  Last Vitals:  Vitals:   05/01/22 1040 05/01/22 1112  BP: (!) 147/57 (!) 142/73  Pulse: 73 72  Resp: 14 16  Temp:  36.5 C  SpO2: 93% 99%    Last Pain:  Vitals:   05/01/22 1112  TempSrc: Oral  PainSc:                  [name]

## 2022-05-01 NOTE — Progress Notes (Signed)
Brianna Bush 10:00 AM  Subjective: Patient without any new complaints and no signs of bleeding and we rediscussed the procedure  Objective: Vital signs stable afebrile no acute distress exam please see preassessment evaluation BUN decreased hemoglobin slight increase  Assessment: Melena abnormal CTA anemia  Plan: Okay to proceed with endoscopy with anesthesia assistance with further workup and plans pending those findings  [facility]  office [phone] After 5PM or if no answer call [phone]

## 2022-05-01 NOTE — Evaluation (Signed)
Occupational Therapy Evaluation Patient Details Name: Brianna Bush MRN: [id number] DOB: 06/12/1923 Today's Date: 05/01/2022   History of Present Illness 88 y.o. female presented after experiencing pain in her back 3 days ago that she was attributing to moving a Christmas tree, but also began to have some pain in the upper abdomen.  CT of the abdomen pelvis raises question of antral and distal gastric body thickening. Fecal occult blood testing is positive. Patient admitted with concern for GI bleed/acute blood loss anemia/iron deficiency. Pt also noted to have L5 compression fracture. PMH significant for hypertension, hyperlipidemia, GERD, and CKD 3A   Clinical Impression   Patient is currently presenting below her baseline level of functioning for self-care management, given mild back pain & reports of generalized weakness, intermittent dizziness in standing, and feelings of shortness of breath with activity. She requires steadying assist during dynamic standing tasks, as well as intermittent cues for memory/recall. She stated she plans to stay with her family for a while once discharged from the hospital. She will benefit from further OT services to maximize her safety and independence with ADLs and to decrease the risk for restricted participation in meaningful activities.      Recommendations for follow up therapy are one component of a multi-disciplinary discharge planning process, led by the attending physician.  Recommendations may be updated based on patient status, additional functional criteria and insurance authorization.   Follow Up Recommendations  Home health OT     Assistance Recommended at Discharge Intermittent Supervision/Assistance  Patient can return home with the following A little help with bathing/dressing/bathroom;Assist for transportation;Assistance with cooking/housework;Direct supervision/assist for financial management;Direct supervision/assist for medications  management    Functional Status Assessment  Patient has had a recent decline in their functional status and demonstrates the ability to make significant improvements in function in a reasonable and predictable amount of time.  Equipment Recommendations  None recommended by OT       Precautions / Restrictions Precautions Precautions: Fall Restrictions Weight Bearing Restrictions: No      Mobility Bed Mobility      General bed mobility comments: Pt was received seated in the bedside chair    Transfers Overall transfer level: Needs assistance   Transfers: Sit to/from Stand Sit to Stand: Min guard                  Balance Overall balance assessment: Needs assistance   Sitting balance-Leahy Scale: Good         Standing balance comment: min assist             ADL either performed or assessed with clinical judgement   ADL Overall ADL's : Needs assistance/impaired Eating/Feeding: Independent Eating/Feeding Details (indicate cue type and reason): based on clinical judgement Grooming: Set up;Min guard Grooming Details (indicate cue type and reason): Set-up seated or min guard standing         Upper Body Dressing : Supervision/safety;Set up;Sitting   Lower Body Dressing: Minimal assistance               Vision Baseline Vision/History: 1 Wears glasses Additional Comments: She reported baseline vision impairment. She was able to correctly read the time depicted on the wall clock.            Pertinent Vitals/Pain Pain Assessment Pain Assessment: 0-10 Pain Location: She reported "mild" back pain. Pain Intervention(s): Limited activity within patient's tolerance     Hand Dominance Right   Extremity/Trunk Assessment Upper Extremity Assessment Upper Extremity  Assessment: Overall WFL for tasks assessed   Lower Extremity Assessment Lower Extremity Assessment: Overall WFL for tasks assessed      Communication Communication Communication:  HOH   Cognition Arousal/Alertness: Awake/alert Behavior During Therapy: WFL for tasks assessed/performed Overall Cognitive Status: Within Functional Limits for tasks assessed        General Comments: Oriented to person, place, month, and year. Able to follow 1-2 step commands                Home Living Family/patient expects to be discharged to:: Private residence Living Arrangements: Alone Available Help at Discharge: Family Type of Home: House       Home Layout: Multi-level;Able to live on main level with bedroom/bathroom Alternate Level Stairs-Number of Steps: she resides on the main level of the home   Bathroom Shower/Tub: Occupational psychologist: Handicapped height Bathroom Accessibility: Yes   Home Equipment: [place] held shower head, [place]   Additional Comments:  (She plans to stay with her family for a few days upon discharge.)      Prior Functioning/Environment Prior Level of Function : Independent/Modified Independent             Mobility Comments:  (Ambulatory without an assistive device) ADLs Comments: Modified independent to independent with ADLs, she does not drive, she performed cooking, and she her granddaugher performs household cleaning.        OT Problem List: Decreased strength;Impaired balance (sitting and/or standing);Decreased activity tolerance;Impaired vision/perception;Decreased knowledge of use of DME or AE;Decreased knowledge of precautions;Pain      OT Treatment/Interventions: Self-care/ADL training;Therapeutic exercise;Therapeutic activities;Energy conservation;Patient/family education;DME and/or AE instruction;Balance training    OT Goals(Current goals can be found in the care plan section) Acute Rehab OT Goals Patient Stated Goal: to return home soon OT Goal Formulation: With patient Time For Goal Achievement: 05/15/22 Potential to Achieve Goals: Good ADL Goals Pt Will Perform Grooming: with  supervision;standing Pt Will Perform Upper Body Dressing: with set-up;sitting Pt Will Perform Lower Body Dressing: with supervision;sit to/from stand Pt Will Transfer to Toilet: with supervision;ambulating Pt Will Perform Toileting - Clothing Manipulation and hygiene: with supervision;sit to/from stand  OT Frequency: Min 2X/week       AM-PAC OT "6 Clicks" Daily Activity     Outcome Measure Help from another person eating meals?: None Help from another person taking care of personal grooming?: A Little Help from another person toileting, which includes using toliet, bedpan, or urinal?: A Little Help from another person bathing (including washing, rinsing, drying)?: A Little Help from another person to put on and taking off regular upper body clothing?: None Help from another person to put on and taking off regular lower body clothing?: A Little 6 Click Score: 20   End of Session Nurse Communication: Mobility status  Activity Tolerance: Patient tolerated treatment well Patient left: in chair;with call bell/phone within reach;with family/visitor present  OT Visit Diagnosis: Unsteadiness on feet (R26.81);Pain                Time: 6301-6010 OT Time Calculation (min): 19 min Charges:  OT General Charges $OT Visit: 1 Visit OT Evaluation $OT Eval Low Complexity: 1 Low    Jagjit Riner L Bristyn Kulesza, OTR/L 05/01/2022, 4:55 PM

## 2022-05-01 NOTE — TOC Initial Note (Signed)
Transition of Care [facility] - Initial/Assessment Note    Patient Details  Name: Brianna Bush MRN: [id number] Date of Birth: October 31, 1923  Transition of [facility]) CM/SW Contact:    Vassie Moselle, LCSW Phone Number: 05/01/2022, 4:14 PM  Clinical Narrative:                 CSW spoke with pt and granddaughter via t/c to room. Pt is agreeable to home health being arranged. She currently has a PCS 1x a week through [facility], HHPT/OT has been arranged with [facility]. HH orders will need to be placed prior to discharge. Pt shares she has a RW, shower seat, and BSC at home and declined further DME at this time.   Expected Discharge Plan: [place] Barriers to Discharge: No Barriers Identified   Patient Goals and CMS Choice Patient states their goals for this hospitalization and ongoing recovery are:: To go home CMS Medicare.gov Compare Post Acute Care list provided to:: Patient Choice offered to / list presented to : Patient, Adult [place] ownership interest in [facility].provided to:: Patient    Expected Discharge Plan and Services In-house Referral: NA Discharge Planning Services: NA Post Acute Care Choice: [place] arrangements for the past 2 months: Single Family Home                 DME Arranged: N/A DME Agency: NA       HH Arranged: PT, OT HH Agency: [facility] Date [place]: 05/01/22 Time [place]: [phone] Representative spoke with at [place]: Claiborne Billings  Prior Living Arrangements/Services Living arrangements for the past 2 months: [place] with:: Self Patient language and need for interpreter reviewed:: Yes Do you feel safe going back to the place where you live?: Yes      Need for Family Participation in Patient Care: No (Comment) Care giver support system in place?: No (comment) Current home services: DME (RW, BSC, shower seat) Criminal Activity/Legal  Involvement Pertinent to Current Situation/Hospitalization: No - Comment as needed  Activities of Daily Living Home Assistive Devices/Equipment: None ADL Screening (condition at time of admission) Patient's cognitive ability adequate to safely complete daily activities?: Yes Is the patient deaf or have difficulty hearing?: No Does the patient have difficulty seeing, even when wearing glasses/contacts?: No Does the patient have difficulty concentrating, remembering, or making decisions?: No Patient able to express need for assistance with ADLs?: No Does the patient have difficulty dressing or bathing?: Yes Independently performs ADLs?: Yes (appropriate for developmental age) Does the patient have difficulty walking or climbing stairs?: No Weakness of Legs: None Weakness of Arms/Hands: None  Permission Sought/Granted Permission sought to share information with : Family Supports Permission granted to share information with : Yes, Verbal Permission Granted        Permission granted to share info w Relationship: Daughters and granddaughter     Emotional Assessment   Attitude/Demeanor/Rapport: Unable to Assess Affect (typically observed): Unable to Assess Orientation: : Oriented to Self, Oriented to Place, Oriented to  Time, Oriented to Situation Alcohol / Substance Use: Not Applicable Psych Involvement: No (comment)  Admission diagnosis:  GI bleeding [K92.2] Epigastric pain [R10.13] Gastrointestinal hemorrhage with melena [K92.1] Acute midline low back pain without sciatica [M54.50] Patient Active Problem List   Diagnosis Date Noted   Acute upper GI bleed 04/30/2022   GI bleeding 04/29/2022   Macrocytic anemia 04/29/2022   Colitis 10/11/2015   Hypokalemia  10/11/2015   Stage 3a chronic kidney disease (CKD) ([place]) 10/11/2015   GERD (gastroesophageal reflux disease) 10/11/2015   Leg wound, left 10/11/2015   Hypertension 10/11/2015   Hyperlipidemia 10/11/2015   Endometrial  carcinoma ([place]) 10/11/2015   Colitis, acute 10/11/2015   PCP:  Lurline Del, DO Pharmacy:   [facility] [address] [place] [address] Phone: [phone] Fax: [phone]     Social Determinants of Health (SDOH) Social History: [place]: No Food Insecurity (04/30/2022)  Housing: Low Risk  (04/30/2022)  Transportation Needs: No Transportation Needs (04/30/2022)  Utilities: Not At Risk (04/30/2022)   SDOH Interventions: Food Insecurity Interventions: Intervention Not Indicated Housing Interventions: Intervention Not Indicated Utilities Interventions: Intervention Not Indicated   Readmission Risk Interventions     No data to display

## 2022-05-02 DIAGNOSIS — N1831 Chronic kidney disease, stage 3a: Secondary | ICD-10-CM | POA: Diagnosis not present

## 2022-05-02 DIAGNOSIS — K922 Gastrointestinal hemorrhage, unspecified: Secondary | ICD-10-CM | POA: Diagnosis not present

## 2022-05-02 LAB — CBC
HCT: 24.2 % — ABNORMAL LOW (ref 36.0–46.0)
Hemoglobin: 7.5 g/dL — ABNORMAL LOW (ref 12.0–15.0)
MCH: 31.3 pg (ref 26.0–34.0)
MCHC: 31 g/dL (ref 30.0–36.0)
MCV: 100.8 fL — ABNORMAL HIGH (ref 80.0–100.0)
Platelets: 306 10*3/uL (ref 150–400)
RBC: 2.4 MIL/uL — ABNORMAL LOW (ref 3.87–5.11)
RDW: 17.3 % — ABNORMAL HIGH (ref 11.5–15.5)
WBC: 12.9 10*3/uL — ABNORMAL HIGH (ref 4.0–10.5)
nRBC: 0.5 % — ABNORMAL HIGH (ref 0.0–0.2)

## 2022-05-02 LAB — BASIC METABOLIC PANEL
Anion gap: 5 (ref 5–15)
BUN: 26 mg/dL — ABNORMAL HIGH (ref 8–23)
CO2: 24 mmol/L (ref 22–32)
Calcium: 8.6 mg/dL — ABNORMAL LOW (ref 8.9–10.3)
Chloride: 113 mmol/L — ABNORMAL HIGH (ref 98–111)
Creatinine, Ser: 0.87 mg/dL (ref 0.44–1.00)
GFR, Estimated: >60 mL/min (ref >60)
Glucose, Bld: 105 mg/dL — ABNORMAL HIGH (ref 70–99)
Potassium: 3.8 mmol/L (ref 3.5–5.1)
Sodium: 142 mmol/L (ref 135–145)

## 2022-05-02 MED ORDER — CYANOCOBALAMIN 1000 MCG PO TABS: 1000.0000 ug | ORAL_TABLET | Freq: Every day | ORAL | 0 refills | Status: AC

## 2022-05-02 MED ORDER — PANTOPRAZOLE SODIUM 40 MG PO TBEC: 40.0000 mg | DELAYED_RELEASE_TABLET | Freq: Two times a day (BID) | ORAL | 1 refills | Status: AC

## 2022-05-02 NOTE — Discharge Summary (Signed)
Physician Discharge Summary  Brianna Bush IEP:[id number] DOB: 17-Jun-1923 DOA: 04/29/2022  PCP: Lurline Del, DO  Admit date: 04/29/2022 Discharge date: 05/02/2022  Admitted From: Home Disposition: Home  Recommendations for Outpatient Follow-up:  Follow up with PCP in 1 week with repeat CBC/BMP Outpatient follow-up with GI Follow up in ED if symptoms worsen or new appear   Home Health: No Equipment/Devices: None  Discharge Condition: Stable CODE STATUS: DNR Diet recommendation: Heart healthy  Brief/Interim Summary: 88 y.o. female with medical history significant for hypertension, hyperlipidemia, GERD, and CKD 3A who presented to emergency department with abdominal pain, back pain, nausea, and general weakness.  On presentation, CT of abdomen and pelvis raised concern of antral and distal gastric body thickening.  Fecal occult blood testing was positive.  She was admitted with concern for GI bleeding; hemoglobin was 8.5.  She was started on IV Protonix.  She underwent iron transfusion.  She had EGD on 05/01/2022 which showed nonbleeding gastric ulcer with no stigmata of bleeding which was biopsied.  GI recommended to continue Protonix twice a day on discharge.  Hemoglobin has subsequently remained stable.  She wants to go home today.  Discharge patient home today on oral PPI and oral iron.  Outpatient follow-up with PCP and GI.  Discharge Diagnoses:   Possible acute blood loss anemia with iron deficiency Upper GI bleeding possibly from nonbleeding gastric ulcer with erosive gastropathy -Ferritin 43, iron 21, TIBC 316, percent saturation 7.  Received iron infusion during this hospitalization.  Will need iron supplementation on discharge -hemoglobin was 8.5.  She was started on IV Protonix.  She had EGD on 05/01/2022 which showed nonbleeding gastric ulcer with no stigmata of bleeding which was biopsied.  GI recommended to continue Protonix twice a day on discharge.  Hemoglobin has  subsequently remained stable, although on the lower side; 7.5 today.  She wants to go home today.  Discharge patient home today on oral PPI and oral iron.  -Outpatient follow-up with PCP and GI  Back pain/L5 compression fracture -Will need home health PT/OT.  Continue conservative management.  Not in severe pain  Chronically disease stage IIIa Hyperkalemia: Resolved -Creatinine currently stable.  Outpatient follow-up  Vitamin B12 deficiency -B12 level 199.  Continue supplementation.  Right kidney lesions -Noted to be benign cysts on ultrasound. No further workup.   Discharge Instructions  Discharge Instructions     Diet - low sodium heart healthy   Complete by: As directed    Increase activity slowly   Complete by: As directed       Allergies as of 05/02/2022       Reactions   Atenolol-chlorthalidone Other (See Comments)   Bradycardia        Medication List     STOP taking these medications    Aleve 220 MG tablet Generic drug: naproxen sodium   ciprofloxacin 500 MG tablet Commonly known as: Cipro   metroNIDAZOLE 500 MG tablet Commonly known as: Flagyl       TAKE these medications    alendronate 70 MG tablet Commonly known as: FOSAMAX Take 70 mg by mouth every Tuesday. Take with a full glass of water on an empty stomach.   amLODipine 5 MG tablet Commonly known as: NORVASC Take 5 mg by mouth at bedtime.   cyanocobalamin 1000 MCG tablet Take 1 tablet (1,000 mcg total) by mouth daily.   ferrous sulfate 325 (65 FE) MG tablet Take 1 tablet (325 mg total) by mouth daily.   pantoprazole  40 MG tablet Commonly known as: Protonix Take 1 tablet (40 mg total) by mouth 2 (two) times daily before a meal.   simvastatin 10 MG tablet Commonly known as: ZOCOR Take 10 mg by mouth at bedtime.   TYLENOL 500 MG tablet Generic drug: acetaminophen Take 500-1,000 mg by mouth every 6 (six) hours as needed for mild pain or headache.        Follow-up Information      Lurline Del, DO. Schedule an appointment as soon as possible for a visit in 1 week(s).   Specialty: Family Medicine Why: with cbc/bmp Contact information: [place] [address] [phone]         Clarene Essex, MD Follow up in 2 week(s).   Specialty: Gastroenterology Contact information: [address] [address] [phone]                Allergies  Allergen Reactions   Atenolol-Chlorthalidone Other (See Comments)    Bradycardia     Consultations: GI   Procedures/Studies: US RENAL  Result Date: 04/29/2022 CLINICAL DATA:  Follow-up indeterminate renal lesions on same-day CT abdomen and pelvis EXAM: RENAL / URINARY TRACT ULTRASOUND COMPLETE COMPARISON:  CT 04/29/2022 FINDINGS: Right Kidney: Renal measurements: 9.5 x 3.7 x 4.4 cm = volume: 81 mL. Two anechoic cysts in the posterior right kidney measure 1.5 and 1.2 cm respectively. No internal vascularity. A third cyst in the right kidney also is anechoic without internal vascularity and measures 1.6 cm. No hydronephrosis. Normal cortical echogenicity. Left Kidney: Renal measurements: 9.1 x 4.9 x 4.7 cm = volume: 110 mL. Anechoic cysts without internal vascularity in the left kidney measure 2.0 cm and 0.9 cm, respectively. Normal cortical echogenicity. No hydronephrosis. Bladder: Appears normal for degree of bladder distention. Other: None. IMPRESSION: Indeterminate lesion seen on same day CT abdomen and pelvis are compatible with benign simple cysts given ultrasound characteristics. No additional follow-up is recommended. Electronically Signed   By: Placido Sou M.D.   On: 04/29/2022 21:26   CT L-SPINE NO CHARGE  Result Date: 04/29/2022 CLINICAL DATA:  Initial evaluation for acute abdominal pain, nausea, vomiting. EXAM: CT LUMBAR SPINE WITHOUT CONTRAST TECHNIQUE: Multidetector CT imaging of the lumbar spine was performed without intravenous contrast administration.  Multiplanar CT image reconstructions were also generated. RADIATION DOSE REDUCTION: This exam was performed according to the departmental dose-optimization program which includes automated exposure control, adjustment of the mA and/or kV according to patient size and/or use of iterative reconstruction technique. COMPARISON:  Comparison made with prior radiograph from 06/13/2022 as well as previous. FINDINGS: Segmentation: Standard. Lowest well-formed disc space labeled the L5-S1 level. Alignment: Moderate dextroscoliosis, apex at L1-2. Slight rotatory component again noted. 4 mm retrolisthesis of L1 on L2, 5 mm retrolisthesis of L2 on L3, 5 mm anterolisthesis of L4 on L5 and L5 on S1. Findings chronic and degenerative. Vertebrae: Subtle concavity at the superior endplate of L5 with subchondral gas lucency, age indeterminate, and could reflect a subtle acute to subacute compression fracture. Otherwise, vertebral body height relatively well maintained with no other acute or recent fracture. Visualized sacrum and pelvis intact. No worrisome osseous lesions. Paraspinal and other soft tissues: Paraspinous soft tissues demonstrate no acute finding. Cardiomegaly noted. Advanced aorto bi-iliac atherosclerotic disease. Moderate retained stool noted within the distal colon/rectum, suggesting constipation. Intermediate density right renal lesions again noted, indeterminate. Disc levels: L1-2: Retrolisthesis. Degenerative intervertebral disc space narrowing with diffuse disc bulge and disc desiccation. Reactive endplate  spurring. Mild facet hypertrophy. No significant canal stenosis. Foramina remain patent. L2-3: Retrolisthesis with advanced degenerative intervertebral disc space narrowing. Diffuse disc bulge with reactive endplate spurring, worse on the left. Bilateral facet hypertrophy. No spinal stenosis. Moderate left L2 foraminal narrowing. Right neural foramen remains patent. L3-4: Degenerative disc space narrowing with  diffuse disc bulge and disc desiccation. Reactive endplate spurring. Moderate facet hypertrophy. Resultant mild canal with moderate bilateral subarticular stenosis. Moderate right with mild left L3 foraminal narrowing. L4-5: Anterolisthesis. Disc bulge with disc desiccation. Superimposed right foraminal disc protrusion closely approximates the exiting right L4 nerve root. Moderate facet arthrosis. Resultant mild left with moderate right lateral recess stenosis. Mild left with moderate right L4 foraminal narrowing. L5-S1: Anterolisthesis. Disc bulge with disc desiccation. Reactive endplate spurring. Moderate to advanced facet arthrosis. No significant spinal stenosis. Moderate right with mild left L5 foraminal narrowing. IMPRESSION: 1. Subtle concavity at the superior endplate of L5, age indeterminate, but could reflect a subtle acute to subacute compression fracture. Correlation with physical exam for possible pain at this location recommended. 2. No other acute osseous abnormality within the lumbar spine. 3. Moderate dextroscoliosis with advanced multilevel degenerative spondylosis and facet arthrosis as above. Resultant mild canal with moderate bilateral subarticular stenosis at L3-4. 4. Right foraminal disc protrusion at L4-5, potentially affecting the right L4 nerve root. 5. Intermediate density lesions within the right kidney, indeterminate. While these findings could reflect proteinaceous and/or hemorrhagic cyst, possible renal neoplasms could also have this appearance. Further evaluation with dedicated renal mass protocol CT and/or MRI suggested for further evaluation. Aortic Atherosclerosis (ICD10-I70.0). Electronically Signed   By: Jeannine Boga M.D.   On: 04/29/2022 20:16   CT ABDOMEN PELVIS W CONTRAST  Result Date: 04/29/2022 CLINICAL DATA:  Acute nonlocalized abdominal pain with nausea and weakness for 3 days. EXAM: CT ABDOMEN AND PELVIS WITH CONTRAST TECHNIQUE: Multidetector CT imaging of  the abdomen and pelvis was performed using the standard protocol following bolus administration of intravenous contrast. RADIATION DOSE REDUCTION: This exam was performed according to the departmental dose-optimization program which includes automated exposure control, adjustment of the mA and/or kV according to patient size and/or use of iterative reconstruction technique. CONTRAST:  79m OMNIPAQUE IOHEXOL 300 MG/ML  SOLN COMPARISON:  October 10, 2015. FINDINGS: Lower chest: Basilar atelectasis and septal thickening. Mild bronchiectatic changes at the LEFT lung base. Cardiomegaly. Coronary artery disease calcification of RIGHT coronary circulation. No chest wall abnormality. Hepatobiliary: Lobular hepatic contours. No focal, suspicious hepatic lesion or pericholecystic stranding. No biliary duct dilation. Elongated RIGHT hepatic lobe may represent RIGHT else lobe. Pancreas: Normal, without mass, inflammation or ductal dilatation. Spleen: Normal. Adrenals/Urinary Tract: Adrenal glands are normal. No hydronephrosis or perinephric stranding. Cyst arises from the lower pole the LEFT kidney measuring water density and 1.8 cm. Along the posterior hilar lip of the LEFT kidney is a 69 Hounsfield unit 1.1 cm lesion that is well-circumscribed and slightly enlarged where it measured 9 mm on the prior study. Intermediate density lesions arising from the lateral cortex of the RIGHT kidney also appears slightly increased in size, largest measuring 15 mm (image 12/2 with a density of 70 Hounsfield units previously displaying a density of 56 Hounsfield units on noncontrast imaging. There is an increase in number of intermediate density lesions along the lateral cortex of the RIGHT kidney since previous imaging approximately 4 additional lesions are seen on images 51 and 55 of series 4 No definite ureteral calculi or hydronephrosis. Urinary bladder is collapsed limiting assessment. There  are phleboliths in the pelvis which are similar  to prior imaging. Stomach/Bowel: Stomach is under distended limiting evaluation. Question antral thickening and distal body thickening and irregularity which persists from initial to the late phase. Small bowel without dilation or signs of inflammation. Colon without dilation or inflammation stool in the rectum only small volume. Appendix not visualized but no secondary signs that would suggest acute appendicitis Vascular/Lymphatic: Aortic atherosclerosis. No sign of aneurysm. Smooth contour of the IVC. There is no gastrohepatic or hepatoduodenal ligament lymphadenopathy. No retroperitoneal or mesenteric lymphadenopathy. No pelvic sidewall lymphadenopathy. Atherosclerosis is moderate to marked. Reproductive: Post hysterectomy. Other: No ascites.  No pneumoperitoneum. Musculoskeletal: Rotary dextroconvex scoliotic curvature centered about the L1 vertebral level similar to previous imaging. No acute musculoskeletal findings. Retrolisthesis of L1 on L2 and L2 on L3 is moderate and similar to prior imaging as well. IMPRESSION: 1. Question of antral thickening and distal gastric body thickening and irregularity which persists from initial to the late phase. Correlate with any symptoms of gastritis or peptic ulcer disease. 2. Intermediate density lesions arising from the lateral cortex of the RIGHT kidney also appears slightly increased in size since previous imaging. These may represent hemorrhagic or proteinaceous cysts but are incompletely characterized not allowing for exclusion of small solid renal neoplasms. Consider follow-up renal sonogram for further evaluation. 3. Cardiomegaly with coronary artery disease calcification of RIGHT coronary circulation. 4. Aortic atherosclerosis. 5. Rotary dextroconvex scoliotic curvature centered about the L1 vertebral level similar to previous imaging. Aortic Atherosclerosis (ICD10-I70.0). Electronically Signed   By: Zetta Bills M.D.   On: 04/29/2022 18:40       Subjective: Patient seen and examined at bedside.  Wants to go home today.  No fever, vomiting, chest pain reported  Discharge Exam: Vitals:   05/01/22 1921 05/02/22 0517  BP: 122/63 131/63  Pulse: 79 76  Resp: 17 18  Temp: (!) 97.4 F (36.3 C) 97.6 F (36.4 C)  SpO2: 94% 94%    General: Pt is alert, awake, not in acute distress.  Elderly female sitting on bed. Cardiovascular: rate controlled, S1/S2 + Respiratory: bilateral decreased breath sounds at bases Abdominal: Soft, NT, ND, bowel sounds + Extremities: no edema, no cyanosis    The results of significant diagnostics from this hospitalization (including imaging, microbiology, ancillary and laboratory) are listed below for reference.     Microbiology: No results found for this or any previous visit (from the past 240 hour(s)).   Labs: BNP (last 3 results) No results for input(s): "BNP" in the last 8760 hours. Basic Metabolic Panel: Recent Labs  Lab 04/29/22 1605 04/30/22 0328 05/01/22 0116 05/02/22 0727  NA 146* 143 141 142  K 5.3* 4.1 4.0 3.8  CL 112* 113* 112* 113*  CO2 '25 23 23 24  '$ GLUCOSE 127* 99 95 105*  BUN 48* 40* 33* 26*  CREATININE 1.08* 0.93 0.97 0.87  CALCIUM 9.8 8.4* 8.7* 8.6*   Liver Function Tests: Recent Labs  Lab 04/29/22 1605  AST 24  ALT 14  ALKPHOS 50  BILITOT 1.0  PROT 6.5  ALBUMIN 3.9   Recent Labs  Lab 04/29/22 1605  LIPASE 53*   No results for input(s): "AMMONIA" in the last 168 hours. CBC: Recent Labs  Lab 04/29/22 1605 04/29/22 2255 04/30/22 0328 04/30/22 1039 04/30/22 1559 05/01/22 0116 05/02/22 0727  WBC 10.3   < > 10.8* 10.8* 11.0* 11.5* 12.9*  NEUTROABS 6.4  --   --   --   --   --   --  HGB 8.5*   < > 7.2* 7.4* 7.4* 7.9* 7.5*  HCT 27.8*   < > 22.9* 23.9* 24.1* 25.9* 24.2*  MCV 101.1*   < > 99.6 101.3* 102.1* 101.6* 100.8*  PLT PLATELET CLUMPS NOTED ON SMEAR, UNABLE TO ESTIMATE   < > 246 253 265 272 306   < > = values in this interval not  displayed.   Cardiac Enzymes: No results for input(s): "CKTOTAL", "CKMB", "CKMBINDEX", "TROPONINI" in the last 168 hours. BNP: Invalid input(s): "POCBNP" CBG: No results for input(s): "GLUCAP" in the last 168 hours. D-Dimer No results for input(s): "DDIMER" in the last 72 hours. Hgb A1c No results for input(s): "HGBA1C" in the last 72 hours. Lipid Profile No results for input(s): "CHOL", "HDL", "LDLCALC", "TRIG", "CHOLHDL", "LDLDIRECT" in the last 72 hours. Thyroid function studies No results for input(s): "TSH", "T4TOTAL", "T3FREE", "THYROIDAB" in the last 72 hours.  Invalid input(s): "FREET3" Anemia work up Recent Labs    04/29/22 2255 04/29/22 2317  VITAMINB12  --  199  FOLATE  --  16.7  FERRITIN  --  43  TIBC  --  316  IRON  --  21*  RETICCTPCT 5.2*  --    Urinalysis    Component Value Date/Time   COLORURINE YELLOW 04/29/2022 1553   APPEARANCEUR HAZY (A) 04/29/2022 1553   LABSPEC 1.019 04/29/2022 1553   PHURINE 5.0 04/29/2022 1553   GLUCOSEU NEGATIVE 04/29/2022 1553   HGBUR NEGATIVE 04/29/2022 1553   BILIRUBINUR NEGATIVE 04/29/2022 1553   KETONESUR NEGATIVE 04/29/2022 1553   PROTEINUR 30 (A) 04/29/2022 1553   NITRITE NEGATIVE 04/29/2022 1553   LEUKOCYTESUR TRACE (A) 04/29/2022 1553   Sepsis Labs Recent Labs  Lab 04/30/22 1039 04/30/22 1559 05/01/22 0116 05/02/22 0727  WBC 10.8* 11.0* 11.5* 12.9*   Microbiology No results found for this or any previous visit (from the past 240 hour(s)).   Time coordinating discharge: 35 minutes  SIGNED:   Aline August, MD  [facility] 05/02/2022, 11:16 AM

## 2022-05-02 NOTE — Progress Notes (Signed)
DC order recd. DC iv per protocol, pt tol well. DC instructions given to pt and family who voice understanding. Pt voices no concerns or c/o. No distress noted. Pt will travel by wheelchair to main entrance to return home via pov. All belongings accompany pt.

## 2022-05-02 NOTE — Progress Notes (Signed)
Mobility Specialist - Progress Note   05/02/22 1054  Mobility  Activity Ambulated with assistance in hallway  Level of Assistance Modified independent, requires aide device or extra time  Assistive Device Front wheel walker  Distance Ambulated (ft) 250 ft  Activity Response Tolerated well  Mobility Referral Yes  $Mobility charge 1 Mobility   Pt received in recliner and agreeable to mobility. No complaints during mobility session. Pt to recliner after session with all needs met & daughter in room.    Maya Johnson Mobility Specialist  

## 2022-05-03 ENCOUNTER — Encounter (HOSPITAL_COMMUNITY): Payer: Self-pay | Admitting: Gastroenterology

## 2022-05-05 LAB — SURGICAL PATHOLOGY

## 2022-05-14 DIAGNOSIS — K922 Gastrointestinal hemorrhage, unspecified: Secondary | ICD-10-CM | POA: Diagnosis not present

## 2022-05-14 DIAGNOSIS — R9389 Abnormal findings on diagnostic imaging of other specified body structures: Secondary | ICD-10-CM | POA: Diagnosis not present

## 2022-05-14 DIAGNOSIS — Z6822 Body mass index (BMI) 22.0-22.9, adult: Secondary | ICD-10-CM | POA: Diagnosis not present

## 2022-05-14 DIAGNOSIS — Z09 Encounter for follow-up examination after completed treatment for conditions other than malignant neoplasm: Secondary | ICD-10-CM | POA: Diagnosis not present

## 2022-05-27 DIAGNOSIS — E7800 Pure hypercholesterolemia, unspecified: Secondary | ICD-10-CM | POA: Diagnosis not present

## 2022-05-27 DIAGNOSIS — M159 Polyosteoarthritis, unspecified: Secondary | ICD-10-CM | POA: Diagnosis not present

## 2022-05-27 DIAGNOSIS — M810 Age-related osteoporosis without current pathological fracture: Secondary | ICD-10-CM | POA: Diagnosis not present

## 2022-05-27 DIAGNOSIS — K219 Gastro-esophageal reflux disease without esophagitis: Secondary | ICD-10-CM | POA: Diagnosis not present

## 2022-05-27 DIAGNOSIS — I10 Essential (primary) hypertension: Secondary | ICD-10-CM | POA: Diagnosis not present

## 2022-06-02 DIAGNOSIS — H34232 Retinal artery branch occlusion, left eye: Secondary | ICD-10-CM | POA: Diagnosis not present

## 2022-06-02 DIAGNOSIS — H43813 Vitreous degeneration, bilateral: Secondary | ICD-10-CM | POA: Diagnosis not present

## 2022-06-02 DIAGNOSIS — H35033 Hypertensive retinopathy, bilateral: Secondary | ICD-10-CM | POA: Diagnosis not present

## 2022-06-02 DIAGNOSIS — H35373 Puckering of macula, bilateral: Secondary | ICD-10-CM | POA: Diagnosis not present

## 2022-06-02 DIAGNOSIS — H353231 Exudative age-related macular degeneration, bilateral, with active choroidal neovascularization: Secondary | ICD-10-CM | POA: Diagnosis not present

## 2022-06-03 DIAGNOSIS — M810 Age-related osteoporosis without current pathological fracture: Secondary | ICD-10-CM | POA: Diagnosis not present

## 2022-06-03 DIAGNOSIS — K219 Gastro-esophageal reflux disease without esophagitis: Secondary | ICD-10-CM | POA: Diagnosis not present

## 2022-06-03 DIAGNOSIS — E7800 Pure hypercholesterolemia, unspecified: Secondary | ICD-10-CM | POA: Diagnosis not present

## 2022-06-03 DIAGNOSIS — M159 Polyosteoarthritis, unspecified: Secondary | ICD-10-CM | POA: Diagnosis not present

## 2022-06-03 DIAGNOSIS — I10 Essential (primary) hypertension: Secondary | ICD-10-CM | POA: Diagnosis not present

## 2022-06-30 DIAGNOSIS — H353231 Exudative age-related macular degeneration, bilateral, with active choroidal neovascularization: Secondary | ICD-10-CM | POA: Diagnosis not present

## 2022-08-07 DIAGNOSIS — H353231 Exudative age-related macular degeneration, bilateral, with active choroidal neovascularization: Secondary | ICD-10-CM | POA: Diagnosis not present

## 2022-09-16 DIAGNOSIS — Z0000 Encounter for general adult medical examination without abnormal findings: Secondary | ICD-10-CM | POA: Diagnosis not present

## 2022-09-16 DIAGNOSIS — N1831 Chronic kidney disease, stage 3a: Secondary | ICD-10-CM | POA: Diagnosis not present

## 2022-09-16 DIAGNOSIS — D509 Iron deficiency anemia, unspecified: Secondary | ICD-10-CM | POA: Diagnosis not present

## 2022-09-16 DIAGNOSIS — I700 Atherosclerosis of aorta: Secondary | ICD-10-CM | POA: Diagnosis not present

## 2022-09-16 DIAGNOSIS — H6123 Impacted cerumen, bilateral: Secondary | ICD-10-CM | POA: Diagnosis not present

## 2022-09-16 DIAGNOSIS — E559 Vitamin D deficiency, unspecified: Secondary | ICD-10-CM | POA: Diagnosis not present

## 2022-09-16 DIAGNOSIS — E7800 Pure hypercholesterolemia, unspecified: Secondary | ICD-10-CM | POA: Diagnosis not present

## 2022-09-16 DIAGNOSIS — M79674 Pain in right toe(s): Secondary | ICD-10-CM | POA: Diagnosis not present

## 2022-09-16 DIAGNOSIS — R197 Diarrhea, unspecified: Secondary | ICD-10-CM | POA: Diagnosis not present

## 2022-09-21 DIAGNOSIS — H353231 Exudative age-related macular degeneration, bilateral, with active choroidal neovascularization: Secondary | ICD-10-CM | POA: Diagnosis not present

## 2022-10-27 DIAGNOSIS — H43813 Vitreous degeneration, bilateral: Secondary | ICD-10-CM | POA: Diagnosis not present

## 2022-10-27 DIAGNOSIS — H353231 Exudative age-related macular degeneration, bilateral, with active choroidal neovascularization: Secondary | ICD-10-CM | POA: Diagnosis not present

## 2022-10-27 DIAGNOSIS — H35373 Puckering of macula, bilateral: Secondary | ICD-10-CM | POA: Diagnosis not present

## 2022-10-27 DIAGNOSIS — H34232 Retinal artery branch occlusion, left eye: Secondary | ICD-10-CM | POA: Diagnosis not present

## 2022-10-27 DIAGNOSIS — H35033 Hypertensive retinopathy, bilateral: Secondary | ICD-10-CM | POA: Diagnosis not present

## 2022-11-27 DIAGNOSIS — H353231 Exudative age-related macular degeneration, bilateral, with active choroidal neovascularization: Secondary | ICD-10-CM | POA: Diagnosis not present

## 2022-12-26 ENCOUNTER — Emergency Department (HOSPITAL_COMMUNITY): Payer: Medicare HMO

## 2022-12-26 ENCOUNTER — Other Ambulatory Visit: Payer: Self-pay

## 2022-12-26 ENCOUNTER — Encounter (HOSPITAL_COMMUNITY): Payer: Self-pay | Admitting: Internal Medicine

## 2022-12-26 ENCOUNTER — Inpatient Hospital Stay (HOSPITAL_COMMUNITY)
Admission: EM | Admit: 2022-12-26 | Discharge: 2022-12-31 | DRG: 521 | Disposition: A | Discharge status: Skilled Nursing Facility | Payer: Medicare HMO | Attending: Student | Admitting: Student

## 2022-12-26 DIAGNOSIS — R531 Weakness: Secondary | ICD-10-CM | POA: Diagnosis not present

## 2022-12-26 DIAGNOSIS — Z8542 Personal history of malignant neoplasm of other parts of uterus: Secondary | ICD-10-CM

## 2022-12-26 DIAGNOSIS — Z743 Need for continuous supervision: Secondary | ICD-10-CM | POA: Diagnosis not present

## 2022-12-26 DIAGNOSIS — W19XXXA Unspecified fall, initial encounter: Secondary | ICD-10-CM | POA: Diagnosis not present

## 2022-12-26 DIAGNOSIS — S72002A Fracture of unspecified part of neck of left femur, initial encounter for closed fracture: Principal | ICD-10-CM

## 2022-12-26 DIAGNOSIS — S0990XA Unspecified injury of head, initial encounter: Secondary | ICD-10-CM | POA: Diagnosis not present

## 2022-12-26 DIAGNOSIS — Z66 Do not resuscitate: Secondary | ICD-10-CM | POA: Diagnosis present

## 2022-12-26 DIAGNOSIS — I517 Cardiomegaly: Secondary | ICD-10-CM | POA: Diagnosis not present

## 2022-12-26 DIAGNOSIS — I110 Hypertensive heart disease with heart failure: Secondary | ICD-10-CM | POA: Diagnosis present

## 2022-12-26 DIAGNOSIS — R001 Bradycardia, unspecified: Secondary | ICD-10-CM | POA: Diagnosis not present

## 2022-12-26 DIAGNOSIS — M160 Bilateral primary osteoarthritis of hip: Secondary | ICD-10-CM | POA: Diagnosis not present

## 2022-12-26 DIAGNOSIS — M4186 Other forms of scoliosis, lumbar region: Secondary | ICD-10-CM | POA: Diagnosis not present

## 2022-12-26 DIAGNOSIS — R079 Chest pain, unspecified: Secondary | ICD-10-CM | POA: Diagnosis not present

## 2022-12-26 DIAGNOSIS — I700 Atherosclerosis of aorta: Secondary | ICD-10-CM | POA: Diagnosis not present

## 2022-12-26 DIAGNOSIS — S72012A Unspecified intracapsular fracture of left femur, initial encounter for closed fracture: Principal | ICD-10-CM

## 2022-12-26 DIAGNOSIS — R0989 Other specified symptoms and signs involving the circulatory and respiratory systems: Secondary | ICD-10-CM | POA: Diagnosis not present

## 2022-12-26 DIAGNOSIS — E785 Hyperlipidemia, unspecified: Secondary | ICD-10-CM | POA: Diagnosis present

## 2022-12-26 DIAGNOSIS — Z96652 Presence of left artificial knee joint: Secondary | ICD-10-CM | POA: Diagnosis not present

## 2022-12-26 DIAGNOSIS — S72009A Fracture of unspecified part of neck of unspecified femur, initial encounter for closed fracture: Secondary | ICD-10-CM | POA: Diagnosis present

## 2022-12-26 DIAGNOSIS — M8580 Other specified disorders of bone density and structure, unspecified site: Secondary | ICD-10-CM | POA: Diagnosis present

## 2022-12-26 DIAGNOSIS — J690 Pneumonitis due to inhalation of food and vomit: Secondary | ICD-10-CM | POA: Insufficient documentation

## 2022-12-26 DIAGNOSIS — J189 Pneumonia, unspecified organism: Secondary | ICD-10-CM | POA: Diagnosis not present

## 2022-12-26 DIAGNOSIS — M25552 Pain in left hip: Secondary | ICD-10-CM | POA: Diagnosis not present

## 2022-12-26 DIAGNOSIS — K219 Gastro-esophageal reflux disease without esophagitis: Secondary | ICD-10-CM | POA: Diagnosis present

## 2022-12-26 DIAGNOSIS — M85862 Other specified disorders of bone density and structure, left lower leg: Secondary | ICD-10-CM | POA: Diagnosis not present

## 2022-12-26 DIAGNOSIS — Z888 Allergy status to other drugs, medicaments and biological substances status: Secondary | ICD-10-CM

## 2022-12-26 DIAGNOSIS — I5031 Acute diastolic (congestive) heart failure: Secondary | ICD-10-CM | POA: Diagnosis present

## 2022-12-26 DIAGNOSIS — J9601 Acute respiratory failure with hypoxia: Secondary | ICD-10-CM | POA: Insufficient documentation

## 2022-12-26 DIAGNOSIS — Z7983 Long term (current) use of bisphosphonates: Secondary | ICD-10-CM

## 2022-12-26 DIAGNOSIS — Z79899 Other long term (current) drug therapy: Secondary | ICD-10-CM

## 2022-12-26 DIAGNOSIS — K589 Irritable bowel syndrome without diarrhea: Secondary | ICD-10-CM | POA: Diagnosis present

## 2022-12-26 DIAGNOSIS — N179 Acute kidney failure, unspecified: Secondary | ICD-10-CM | POA: Diagnosis not present

## 2022-12-26 DIAGNOSIS — F439 Reaction to severe stress, unspecified: Secondary | ICD-10-CM | POA: Diagnosis not present

## 2022-12-26 DIAGNOSIS — M47816 Spondylosis without myelopathy or radiculopathy, lumbar region: Secondary | ICD-10-CM | POA: Diagnosis not present

## 2022-12-26 DIAGNOSIS — W109XXA Fall (on) (from) unspecified stairs and steps, initial encounter: Secondary | ICD-10-CM | POA: Diagnosis present

## 2022-12-26 LAB — BASIC METABOLIC PANEL
Anion gap: 10 (ref 5–15)
BUN: 31 mg/dL — ABNORMAL HIGH (ref 8–23)
CO2: 23 mmol/L (ref 22–32)
Calcium: 9.2 mg/dL (ref 8.9–10.3)
Chloride: 105 mmol/L (ref 98–111)
Creatinine, Ser: 0.63 mg/dL (ref 0.44–1.00)
GFR, Estimated: >60 mL/min (ref >60)
Glucose, Bld: 106 mg/dL — ABNORMAL HIGH (ref 70–99)
Potassium: 4 mmol/L (ref 3.5–5.1)
Sodium: 138 mmol/L (ref 135–145)

## 2022-12-26 LAB — SURGICAL PCR SCREEN
MRSA, PCR: NEGATIVE
Staphylococcus aureus: NEGATIVE

## 2022-12-26 LAB — BRAIN NATRIURETIC PEPTIDE: B Natriuretic Peptide: 177.2 pg/mL — ABNORMAL HIGH (ref 0.0–100.0)

## 2022-12-26 LAB — CBC WITH DIFFERENTIAL/PLATELET
Abs Immature Granulocytes: 0.16 10*3/uL — ABNORMAL HIGH (ref 0.00–0.07)
Basophils Absolute: 0.1 10*3/uL (ref 0.0–0.1)
Basophils Relative: 0 %
Eosinophils Absolute: 0 10*3/uL (ref 0.0–0.5)
Eosinophils Relative: 0 %
HCT: 47.7 % — ABNORMAL HIGH (ref 36.0–46.0)
Hemoglobin: 15.4 g/dL — ABNORMAL HIGH (ref 12.0–15.0)
Immature Granulocytes: 1 %
Lymphocytes Relative: 6 %
Lymphs Abs: 1.1 10*3/uL (ref 0.7–4.0)
MCH: 31 pg (ref 26.0–34.0)
MCHC: 32.3 g/dL (ref 30.0–36.0)
MCV: 96.2 fL (ref 80.0–100.0)
Monocytes Absolute: 1.3 10*3/uL — ABNORMAL HIGH (ref 0.1–1.0)
Monocytes Relative: 7 %
Neutro Abs: 16.1 10*3/uL — ABNORMAL HIGH (ref 1.7–7.7)
Neutrophils Relative %: 86 %
Platelets: 330 10*3/uL (ref 150–400)
RBC: 4.96 MIL/uL (ref 3.87–5.11)
RDW: 14.3 % (ref 11.5–15.5)
WBC: 18.8 10*3/uL — ABNORMAL HIGH (ref 4.0–10.5)
nRBC: 0 % (ref 0.0–0.2)

## 2022-12-26 MED ORDER — CEFAZOLIN SODIUM-DEXTROSE 2-4 GM/100ML-% IV SOLN
2.0000 g | INTRAVENOUS | Status: DC
  Filled 2022-12-26: qty 100

## 2022-12-26 MED ORDER — TRANEXAMIC ACID-NACL 1000-0.7 MG/100ML-% IV SOLN
1000.0000 mg | INTRAVENOUS | Status: DC
  Filled 2022-12-26: qty 100

## 2022-12-26 MED ORDER — MORPHINE SULFATE (PF) 2 MG/ML IV SOLN
2.0000 mg | INTRAVENOUS | Status: DC | PRN
  Administered 2022-12-27 – 2022-12-28 (×3): 2 mg via INTRAVENOUS
  Filled 2022-12-26 (×3): qty 1

## 2022-12-26 MED ORDER — ONDANSETRON HCL 4 MG/2ML IJ SOLN: 4.0000 mg | Freq: Four times a day (QID) | INTRAMUSCULAR | Status: DC | PRN

## 2022-12-26 MED ORDER — POVIDONE-IODINE 10 % EX SWAB: 2.0000 | Freq: Once | CUTANEOUS | Status: DC

## 2022-12-26 MED ORDER — ONDANSETRON HCL 4 MG PO TABS
4.0000 mg | ORAL_TABLET | Freq: Four times a day (QID) | ORAL | Status: DC | PRN
  Administered 2022-12-26: 4 mg via ORAL
  Filled 2022-12-26: qty 1

## 2022-12-26 MED ORDER — AMLODIPINE BESYLATE 5 MG PO TABS
5.0000 mg | ORAL_TABLET | Freq: Every day | ORAL | Status: DC
  Administered 2022-12-26 – 2022-12-31 (×6): 5 mg via ORAL
  Filled 2022-12-26 (×6): qty 1

## 2022-12-26 MED ORDER — ACETAMINOPHEN 650 MG RE SUPP: 650.0000 mg | Freq: Four times a day (QID) | RECTAL | Status: DC | PRN

## 2022-12-26 MED ORDER — MORPHINE SULFATE (PF) 2 MG/ML IV SOLN
2.0000 mg | Freq: Once | INTRAVENOUS | Status: AC
  Administered 2022-12-26: 2 mg via INTRAVENOUS
  Filled 2022-12-26: qty 1

## 2022-12-26 MED ORDER — CHLORHEXIDINE GLUCONATE 4 % EX SOLN
60.0000 mL | Freq: Once | CUTANEOUS | Status: AC
  Administered 2022-12-27: 4 via TOPICAL
  Filled 2022-12-26: qty 60

## 2022-12-26 MED ORDER — PANTOPRAZOLE SODIUM 40 MG PO TBEC
40.0000 mg | DELAYED_RELEASE_TABLET | Freq: Two times a day (BID) | ORAL | Status: DC
  Administered 2022-12-27 – 2022-12-31 (×9): 40 mg via ORAL
  Filled 2022-12-26 (×9): qty 1

## 2022-12-26 MED ORDER — SIMVASTATIN 20 MG PO TABS
10.0000 mg | ORAL_TABLET | Freq: Every day | ORAL | Status: DC
  Administered 2022-12-26 – 2022-12-31 (×6): 10 mg via ORAL
  Filled 2022-12-26 (×6): qty 1

## 2022-12-26 MED ORDER — OXYCODONE HCL 5 MG PO TABS
5.0000 mg | ORAL_TABLET | ORAL | Status: DC | PRN
  Administered 2022-12-26: 5 mg via ORAL
  Filled 2022-12-26: qty 1

## 2022-12-26 MED ORDER — ACETAMINOPHEN 325 MG PO TABS: 650.0000 mg | ORAL_TABLET | Freq: Four times a day (QID) | ORAL | Status: DC | PRN

## 2022-12-26 MED ORDER — MORPHINE SULFATE (PF) 2 MG/ML IV SOLN
2.0000 mg | INTRAVENOUS | Status: DC | PRN
  Administered 2022-12-26: 2 mg via INTRAVENOUS
  Filled 2022-12-26: qty 1

## 2022-12-26 NOTE — ED Triage Notes (Signed)
Pt BIBA from home. Pt slipped and fell 2x steps. C/o pain in L hip and L thigh. Placed in binder by EMS, which gaze Pt some relief, Some rotation on L leg.  SPO2 90 on RA on arrival, placed on 5L  up to 95, daughter said pt has had chest congestion for several days

## 2022-12-26 NOTE — H&P (Signed)
History and Physical    Brianna Bush QIH:[id number] DOB: 04-Jul-1923 DOA: 12/26/2022  PCP: Jackelyn Poling, DO   Chief Complaint:  fall  HPI: Brianna Bush is a 89 y.o. female with medical history significant of GERD, hypertension, hyperlipidemia who fell while carrying some food.  There is no loss of consciousness or head trauma.  She landed on her left side and due to severe pain and inability to ambulate she was brought to the emergency department for further assessment.  On arrival she was afebrile and hemodynamically stable.  Labs were obtained which revealed WBC 18.8, hemoglobin 15.4, BMP unrevealing.  CT head was obtained which showed no acute intracranial abnormality.  X-ray of hip showed left femoral neck fracture.  Chest x-ray showed cardiomegaly.  X-ray of left knee showed no evidence of fracture.  Orthopedics was consulted and plans to take to the OR tomorrow.   Review of Systems: Review of Systems  Constitutional:  Negative for chills and fever.  HENT: Negative.    Eyes: Negative.   Respiratory: Negative.    Cardiovascular: Negative.   Gastrointestinal: Negative.   Genitourinary: Negative.   Musculoskeletal: Negative.   Skin: Negative.   Neurological: Negative.   Endo/Heme/Allergies: Negative.   Psychiatric/Behavioral: Negative.    All other systems reviewed and are negative.    As per HPI otherwise 10 point review of systems negative.   Allergies  Allergen Reactions   Atenolol-Chlorthalidone Other (See Comments)    Bradycardia     Past Medical History:  Diagnosis Date   Acute sinusitis, unspecified    Diverticulosis    Endometrial carcinoma (HCC)    GERD (gastroesophageal reflux disease)    Sullivan Lone disease    Hyperlipidemia    Hypertension    IBS (irritable bowel syndrome)    Osteopenia    Vitamin D deficiency     Past Surgical History:  Procedure Laterality Date   BIOPSY  05/01/2022   Procedure: BIOPSY;  Surgeon: Vida Rigger, MD;  Location:  [facility];  Service: Gastroenterology;;   ESOPHAGOGASTRODUODENOSCOPY (EGD) WITH PROPOFOL N/A 05/01/2022   Procedure: ESOPHAGOGASTRODUODENOSCOPY (EGD) WITH PROPOFOL;  Surgeon: Vida Rigger, MD;  Location: [facility];  Service: Gastroenterology;  Laterality: N/A;     reports that she has never smoked. She has never been exposed to tobacco smoke. She has never used smokeless tobacco. No history on file for alcohol use and drug use.  History reviewed. No pertinent family history.  Prior to Admission medications   Medication Sig Start Date End Date Taking? Authorizing Provider  alendronate (FOSAMAX) 70 MG tablet Take 70 mg by mouth every Tuesday. Take with a full glass of water on an empty stomach.    [provider]  amLODipine (NORVASC) 5 MG tablet Take 5 mg by mouth at bedtime.    [provider]  cyanocobalamin 1000 MCG tablet Take 1 tablet (1,000 mcg total) by mouth daily. 05/02/22   Glade Lloyd, MD  ferrous sulfate 325 (65 FE) MG tablet Take 1 tablet (325 mg total) by mouth daily. 05/01/22 05/01/23  Osvaldo Shipper, MD  pantoprazole (PROTONIX) 40 MG tablet Take 1 tablet (40 mg total) by mouth 2 (two) times daily before a meal. 05/02/22 07/01/22  Glade Lloyd, MD  simvastatin (ZOCOR) 10 MG tablet Take 10 mg by mouth at bedtime.    [provider]  TYLENOL 500 MG tablet Take 500-1,000 mg by mouth every 6 (six) hours as needed for mild pain or headache.    [provider]  Physical Exam: Vitals:   12/26/22 1604 12/26/22 1609 12/26/22 2000 12/26/22 2026  BP:   (!) 147/77 (!) 178/96  Pulse:   92 93  Resp:   17 (!) 22  Temp:   98.3 F (36.8 C) 98.5 F (36.9 C)  TempSrc:   Oral Oral  SpO2: 90%  92% 93%  Weight:  55.8 kg  58 kg  Height:  5\' 2"  (1.575 m)  5\' 3"  (1.6 m)   Physical Exam Constitutional:      Appearance: She is normal weight.  HENT:     Head: Normocephalic.     Nose: Nose normal.     Mouth/Throat:     Mouth: Mucous membranes  are moist.     Pharynx: Oropharynx is clear.  Eyes:     Conjunctiva/sclera: Conjunctivae normal.     Pupils: Pupils are equal, round, and reactive to light.  Cardiovascular:     Rate and Rhythm: Normal rate and regular rhythm.  Pulmonary:     Effort: Pulmonary effort is normal.  Abdominal:     General: Abdomen is flat. Bowel sounds are normal.  Genitourinary:    General: Normal vulva.  Musculoskeletal:        General: Tenderness, deformity and signs of injury present.     Cervical back: Normal range of motion.  Skin:    General: Skin is warm.     Capillary Refill: Capillary refill takes less than 2 seconds.  Neurological:     General: No focal deficit present.     Mental Status: She is alert. Mental status is at baseline.  Psychiatric:        Mood and Affect: Mood normal.       Labs on Admission: I have personally reviewed the patients's labs and imaging studies.  Assessment/Plan Principal Problem:   Hip fracture (HCC) Active Problems:   Closed fracture of neck of left femur (HCC)   Mechanical fall complicated by left femoral neck fracture - Patient tripped at home - No loss of consciousness - Orthopedics evaluated patient and deemed necessary for surgical intervention tomorrow  Plan: N.p.o. at midnight Management of hip fracture per orthopedics PT/OT ordered  # Hypertension-continue amlodipine  # GERD-continue Protonix  # Hyperlipidemia-continue simvastatin   Admission status: Observation Med-Surg  Certification: The appropriate patient status for this patient is OBSERVATION. Observation status is judged to be reasonable and necessary in order to provide the required intensity of service to ensure the patient's safety. The patient's presenting symptoms, physical exam findings, and initial radiographic and laboratory data in the context of their medical condition is felt to place them at decreased risk for further clinical deterioration. Furthermore, it is  anticipated that the patient will be medically stable for discharge from the hospital within 2 midnights of admission.     Alan Mulder MD [facility] If 7PM-7AM, please contact night-coverage [email or web address]  12/26/2022, 10:03 PM   \

## 2022-12-26 NOTE — Progress Notes (Signed)
[facility]   Ortho aware of patient  Full consult to follow  89 y/o female with fragility fracture Left femoral neck  Plan for Left anterior hip hemiarthroplasty tomorrow with Dr. Magnus Ivan if pt medically clear   NPO after MN  Mearl Latin, PA-C [phone] (C) 12/26/2022, 6:50 PM  [facility] [address] [place] [address] [phone] Collier Bullock (F)      Patient ID: Brianna Bush, female   DOB: 1924-02-08, 89 y.o.   MRN: [id number]

## 2022-12-26 NOTE — ED Notes (Signed)
Ready for patient, just requested that an order be placed for prn pain medication prior to transfer so that patient is not in pain after transferring and waiting on a provider to place an order.

## 2022-12-26 NOTE — Consult Note (Signed)
Reason for Consult: Left hip fracture Referring Physician:  Wonda Olds EDP  Brianna Bush is an 89 y.o. female.  HPI: The patient is a 89 year old female who unfortunately sustained a mechanical fall earlier today when she actually tripped.  There was no syncopal episode or loss of consciousness.  She was carrying a tray and accidentally was tipping the tray with the items almost falling off the tray and this startled her so she accidentally tripped landing on her left hip.  She had significant left hip pain and the inability to ambulate.  She was brought to the [facility] emergency room via EMS.  She reports only left hip pain.  X-rays were consistent with a displaced left hip femoral neck fracture and orthopedic surgery has been consulted.  Her only current active medical issues are the fact that she has had an upper respiratory illness for over a week now.  Family at the bedside state that it is gotten significantly better.  She was apparently tested for COVID several times and that has been negative.  She feels much better overall and denies any shortness of breath.  She currently denies any headache.  Past Medical History:  Diagnosis Date   Acute sinusitis, unspecified    Diverticulosis    Endometrial carcinoma (HCC)    GERD (gastroesophageal reflux disease)    Sullivan Lone disease    Hyperlipidemia    Hypertension    IBS (irritable bowel syndrome)    Osteopenia    Vitamin D deficiency     Past Surgical History:  Procedure Laterality Date   BIOPSY  05/01/2022   Procedure: BIOPSY;  Surgeon: Vida Rigger, MD;  Location: [facility];  Service: Gastroenterology;;   ESOPHAGOGASTRODUODENOSCOPY (EGD) WITH PROPOFOL N/A 05/01/2022   Procedure: ESOPHAGOGASTRODUODENOSCOPY (EGD) WITH PROPOFOL;  Surgeon: Vida Rigger, MD;  Location: [facility];  Service: Gastroenterology;  Laterality: N/A;    No family history on file.  Social History:  has no history on file for tobacco use, alcohol use,  and drug use.  Allergies:  Allergies  Allergen Reactions   Atenolol-Chlorthalidone Other (See Comments)    Bradycardia     Medications: I have reviewed the patient's current medications.  Results for orders placed or performed during the hospital encounter of 12/26/22 (from the past 48 hour(s))  Basic metabolic panel     Status: Abnormal   Collection Time: 12/26/22  5:40 PM  Result Value Ref Range   Sodium 138 135 - 145 mmol/L   Potassium 4.0 3.5 - 5.1 mmol/L   Chloride 105 98 - 111 mmol/L   CO2 23 22 - 32 mmol/L   Glucose, Bld 106 (H) 70 - 99 mg/dL    Comment: Glucose reference range applies only to samples taken after fasting for at least 8 hours.   BUN 31 (H) 8 - 23 mg/dL   Creatinine, Ser 0.86 0.44 - 1.00 mg/dL   Calcium 9.2 8.9 - 57.8 mg/dL   GFR, Estimated >46 >96 mL/min    Comment: (NOTE) Calculated using the CKD-EPI Creatinine Equation (2021)    Anion gap 10 5 - 15    Comment: Performed at [facility], [address] [place], [place] [address]  CBC with Differential     Status: Abnormal   Collection Time: 12/26/22  5:40 PM  Result Value Ref Range   WBC 18.8 (H) 4.0 - 10.5 K/uL   RBC 4.96 3.87 - 5.11 MIL/uL   Hemoglobin 15.4 (H) 12.0 - 15.0 g/dL  HCT 47.7 (H) 36.0 - 46.0 %   MCV 96.2 80.0 - 100.0 fL   MCH 31.0 26.0 - 34.0 pg   MCHC 32.3 30.0 - 36.0 g/dL   RDW 09.8 11.9 - 14.7 %   Platelets 330 150 - 400 K/uL   nRBC 0.0 0.0 - 0.2 %   Neutrophils Relative % 86 %   Neutro Abs 16.1 (H) 1.7 - 7.7 K/uL   Lymphocytes Relative 6 %   Lymphs Abs 1.1 0.7 - 4.0 K/uL   Monocytes Relative 7 %   Monocytes Absolute 1.3 (H) 0.1 - 1.0 K/uL   Eosinophils Relative 0 %   Eosinophils Absolute 0.0 0.0 - 0.5 K/uL   Basophils Relative 0 %   Basophils Absolute 0.1 0.0 - 0.1 K/uL   Immature Granulocytes 1 %   Abs Immature Granulocytes 0.16 (H) 0.00 - 0.07 K/uL    Comment: Performed at [facility], [address] [place], [place] [address]     DG Knee Left Port  Result Date: 12/26/2022 CLINICAL DATA:  Femoral neck fracture.  Slipped and fall. EXAM: PORTABLE LEFT KNEE - 2 VIEW COMPARISON:  None Available. FINDINGS: Total knee arthroplasty identified with cemented femoral component. Patellar button. Press-Fit tibial component. Osteopenia. No obvious fracture or dislocation. No obvious joint effusion but the lateral view is limited by rotation. Vascular calcifications. IMPRESSION: Total knee arthroplasty.  Osteopenia.  Limited views Electronically Signed   By: Karen Kays M.D.   On: 12/26/2022 19:13   DG Chest Portable 1 View  Result Date: 12/26/2022 CLINICAL DATA:  Fall, left hip pain. Chest congestion for several days. EXAM: PORTABLE CHEST 1 VIEW COMPARISON:  10/01/2006 FINDINGS: Heart is borderline in size. Mild vascular congestion. No confluent airspace opacities, effusions or edema. No acute bony abnormality. Degenerative changes in the shoulders bilaterally, right greater than left. Aortic atherosclerosis. IMPRESSION: Mild cardiomegaly with vascular congestion. Electronically Signed   By: Charlett Nose M.D.   On: 12/26/2022 17:12   DG Hip Unilat With Pelvis 2-3 Views Left  Result Date: 12/26/2022 CLINICAL DATA:  Fall, left hip pain EXAM: DG HIP (WITH OR WITHOUT PELVIS) 2-3V LEFT COMPARISON:  None Available. FINDINGS: There is a left femoral neck fracture. Mild varus angulation. No subluxation or dislocation. Degenerative changes in the hips bilaterally with joint space narrowing and spurring. SI joints are symmetric. Scoliosis and degenerative changes in the lumbar spine. Aortic atherosclerosis. No visible aneurysm. IMPRESSION: Left femoral neck fracture with varus angulation. Electronically Signed   By: Charlett Nose M.D.   On: 12/26/2022 17:10   CT Head Wo Contrast  Result Date: 12/26/2022 CLINICAL DATA:  Head trauma, minor (Age >= 65y).  Fall EXAM: CT HEAD WITHOUT CONTRAST TECHNIQUE: Contiguous axial images were obtained from the  base of the skull through the vertex without intravenous contrast. RADIATION DOSE REDUCTION: This exam was performed according to the departmental dose-optimization program which includes automated exposure control, adjustment of the mA and/or kV according to patient size and/or use of iterative reconstruction technique. COMPARISON:  None Available. FINDINGS: Brain: Mild age related volume loss. No acute intracranial abnormality. Specifically, no hemorrhage, hydrocephalus, mass lesion, acute infarction, or significant intracranial injury. Vascular: No hyperdense vessel or unexpected calcification. Skull: No acute calvarial abnormality. Sinuses/Orbits: Air-fluid levels in the maxillary sinuses bilaterally and left sphenoid sinus. Orbital soft tissues unremarkable. Mastoid air cells clear. Other: None IMPRESSION: No acute intracranial abnormality. Probable mild acute sinusitis changes Electronically Signed   By: Charlett Nose M.D.  On: 12/26/2022 17:10    Review of Systems  Respiratory:  Positive for cough.    Blood pressure (!) 154/71, pulse 86, temperature (!) 97.5 F (36.4 C), temperature source Oral, resp. rate 18, height 5\' 2"  (1.575 m), weight 55.8 kg, SpO2 90%. Physical Exam Vitals reviewed.  Constitutional:      Appearance: She is normal weight.  HENT:     Head: Normocephalic and atraumatic.  Cardiovascular:     Rate and Rhythm: Normal rate.  Musculoskeletal:     Left hip: Tenderness and bony tenderness present. Decreased range of motion. Decreased strength.  Neurological:     Mental Status: She is alert and oriented to person, place, and time.  Psychiatric:        Behavior: Behavior normal.   Her left lower extremity is shortened and externally rotated.  Her foot is well-perfused and she is able to move her ankle and toes.  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  We described operative and nonoperative treatment modalities.  Given the amount of pain she has in the deformity that left leg, she understands this will not heal and that surgery can help with her mobility which can then help improve her quality of life, decrease her pain and improve her mobility.  She is graciously going to be seen by the hospitalist service with medical admission and hopefully optimization for surgery that is planned for tomorrow if she is medically clear.  All questions and concerns were answered and addressed.  She can eat from my standpoint now and should be n.p.o. after midnight tonight.  Kathryne Hitch 12/26/2022, 7:35 PM

## 2022-12-26 NOTE — ED Notes (Signed)
ED TO INPATIENT HANDOFF REPORT  ED Nurse Name and Phone #: Kerron Sedano RN  S Name/Age/Gender Brianna Bush 89 y.o. female Room/Bed: WA15/WA15  Code Status   Code Status: Prior  Home/SNF/Other Home Patient oriented to: self, place, time, and situation Is this baseline? Yes   Triage Complete: Triage complete  Chief Complaint Hip fracture (HCC) [S72.009A]  Triage Note Pt BIBA from home. Pt slipped and fell 2x steps. C/o pain in L hip and L thigh. Placed in binder by EMS, which gaze Pt some relief, Some rotation on L leg.  SPO2 90 on RA on arrival, placed on 5L [place] up to 95, daughter said pt has had chest congestion for several days   Allergies Allergies  Allergen Reactions   Atenolol-Chlorthalidone Other (See Comments)    Bradycardia     Level of Care/Admitting Diagnosis ED Disposition     ED Disposition  Admit   Condition  --   Comment  Hospital [facility] [[id number]]  Level of Care: Med-Surg [16]  May place patient in observation at [facility] or [facility] if equivalent level of care is available:: Yes  Covid Evaluation: Asymptomatic - no recent exposure (last 10 days) testing not required  Diagnosis: Hip fracture [facility]) [[id number]]  Admitting Physician: Alan Mulder [[id number]]  Attending Physician: Alan Mulder [[id number]]          B Medical/Surgery History Past Medical History:  Diagnosis Date   Acute sinusitis, unspecified    Diverticulosis    Endometrial carcinoma (HCC)    GERD (gastroesophageal reflux disease)    Sullivan Lone disease    Hyperlipidemia    Hypertension    IBS (irritable bowel syndrome)    Osteopenia    Vitamin D deficiency    Past Surgical History:  Procedure Laterality Date   BIOPSY  05/01/2022   Procedure: BIOPSY;  Surgeon: Vida Rigger, MD;  Location: [facility];  Service: Gastroenterology;;   ESOPHAGOGASTRODUODENOSCOPY (EGD) WITH PROPOFOL N/A 05/01/2022   Procedure: ESOPHAGOGASTRODUODENOSCOPY (EGD)  WITH PROPOFOL;  Surgeon: Vida Rigger, MD;  Location: [facility];  Service: Gastroenterology;  Laterality: N/A;     A IV Location/Drains/Wounds Patient Lines/Drains/Airways Status     Active Line/Drains/Airways     Name Placement date Placement time Site Days   Peripheral IV 12/26/22 20 G Anterior;Left;Proximal Forearm 12/26/22  1739  Forearm  less than 1            Intake/Output Last 24 hours No intake or output data in the 24 hours ending 12/26/22 1912  Labs/Imaging Results for orders placed or performed during the hospital encounter of 12/26/22 (from the past 48 hour(s))  Basic metabolic panel     Status: Abnormal   Collection Time: 12/26/22  5:40 PM  Result Value Ref Range   Sodium 138 135 - 145 mmol/L   Potassium 4.0 3.5 - 5.1 mmol/L   Chloride 105 98 - 111 mmol/L   CO2 23 22 - 32 mmol/L   Glucose, Bld 106 (H) 70 - 99 mg/dL    Comment: Glucose reference range applies only to samples taken after fasting for at least 8 hours.   BUN 31 (H) 8 - 23 mg/dL   Creatinine, Ser 6.57 0.44 - 1.00 mg/dL   Calcium 9.2 8.9 - 84.6 mg/dL   GFR, Estimated >96 >29 mL/min    Comment: (NOTE) Calculated using the CKD-EPI Creatinine Equation (2021)    Anion gap 10 5 - 15    Comment: Performed at [facility], [address], [place], [place] [address]  CBC with Differential     Status: Abnormal   Collection Time: 12/26/22  5:40 PM  Result Value Ref Range   WBC 18.8 (H) 4.0 - 10.5 K/uL   RBC 4.96 3.87 - 5.11 MIL/uL   Hemoglobin 15.4 (H) 12.0 - 15.0 g/dL   HCT 65.7 (H) 84.6 - 96.2 %   MCV 96.2 80.0 - 100.0 fL   MCH 31.0 26.0 - 34.0 pg   MCHC 32.3 30.0 - 36.0 g/dL   RDW 95.2 84.1 - 32.4 %   Platelets 330 150 - 400 K/uL   nRBC 0.0 0.0 - 0.2 %   Neutrophils Relative % 86 %   Neutro Abs 16.1 (H) 1.7 - 7.7 K/uL   Lymphocytes Relative 6 %   Lymphs Abs 1.1 0.7 - 4.0 K/uL   Monocytes Relative 7 %   Monocytes Absolute 1.3 (H) 0.1 - 1.0 K/uL   Eosinophils Relative 0 %    Eosinophils Absolute 0.0 0.0 - 0.5 K/uL   Basophils Relative 0 %   Basophils Absolute 0.1 0.0 - 0.1 K/uL   Immature Granulocytes 1 %   Abs Immature Granulocytes 0.16 (H) 0.00 - 0.07 K/uL    Comment: Performed at [facility], [address] [place], [place] [address]   DG Chest Portable 1 View  Result Date: 12/26/2022 CLINICAL DATA:  Fall, left hip pain. Chest congestion for several days. EXAM: PORTABLE CHEST 1 VIEW COMPARISON:  10/01/2006 FINDINGS: Heart is borderline in size. Mild vascular congestion. No confluent airspace opacities, effusions or edema. No acute bony abnormality. Degenerative changes in the shoulders bilaterally, right greater than left. Aortic atherosclerosis. IMPRESSION: Mild cardiomegaly with vascular congestion. Electronically Signed   By: Charlett Nose M.D.   On: 12/26/2022 17:12   DG Hip Unilat With Pelvis 2-3 Views Left  Result Date: 12/26/2022 CLINICAL DATA:  Fall, left hip pain EXAM: DG HIP (WITH OR WITHOUT PELVIS) 2-3V LEFT COMPARISON:  None Available. FINDINGS: There is a left femoral neck fracture. Mild varus angulation. No subluxation or dislocation. Degenerative changes in the hips bilaterally with joint space narrowing and spurring. SI joints are symmetric. Scoliosis and degenerative changes in the lumbar spine. Aortic atherosclerosis. No visible aneurysm. IMPRESSION: Left femoral neck fracture with varus angulation. Electronically Signed   By: Charlett Nose M.D.   On: 12/26/2022 17:10   CT Head Wo Contrast  Result Date: 12/26/2022 CLINICAL DATA:  Head trauma, minor (Age >= 65y).  Fall EXAM: CT HEAD WITHOUT CONTRAST TECHNIQUE: Contiguous axial images were obtained from the base of the skull through the vertex without intravenous contrast. RADIATION DOSE REDUCTION: This exam was performed according to the departmental dose-optimization program which includes automated exposure control, adjustment of the mA and/or kV according to patient size  and/or use of iterative reconstruction technique. COMPARISON:  None Available. FINDINGS: Brain: Mild age related volume loss. No acute intracranial abnormality. Specifically, no hemorrhage, hydrocephalus, mass lesion, acute infarction, or significant intracranial injury. Vascular: No hyperdense vessel or unexpected calcification. Skull: No acute calvarial abnormality. Sinuses/Orbits: Air-fluid levels in the maxillary sinuses bilaterally and left sphenoid sinus. Orbital soft tissues unremarkable. Mastoid air cells clear. Other: None IMPRESSION: No acute intracranial abnormality. Probable mild acute sinusitis changes Electronically Signed   By: Charlett Nose M.D.   On: 12/26/2022 17:10    Pending Labs Unresulted Labs (From admission, onward)     Start     Ordered   12/27/22 0500  CBC  WITH DIFFERENTIAL  Tomorrow morning,   R        12/26/22 1901   12/27/22 0500  Type and screen [facility]  Once,   R       Comments: [facility]    12/26/22 1901   12/27/22 0500  VITAMIN D 25 Hydroxy (Vit-D Deficiency, Fractures)  Tomorrow morning,   R        12/26/22 1901   12/26/22 1835  Brain natriuretic peptide  Add-on,   AD        12/26/22 1834   12/26/22 1812  Urinalysis, Routine w reflex microscopic -Urine, Clean Catch  Once,   URGENT       Question:  Specimen Source  Answer:  Urine, Clean Catch   12/26/22 1812            Vitals/Pain Today's Vitals   12/26/22 1600 12/26/22 1604 12/26/22 1608 12/26/22 1609  BP: (!) 154/71     Pulse: 86     Resp: 18     Temp: (!) 97.5 F (36.4 C)     TempSrc: Oral     SpO2: 91% 90%    Weight:    55.8 kg  Height:    5\' 2"  (1.575 m)  PainSc:   10-Worst pain ever     Isolation Precautions No active isolations  Medications Medications  chlorhexidine (HIBICLENS) 4 % liquid 4 Application (has no administration in time range)  povidone-iodine 10 % swab 2 Application (has no administration in time range)  ceFAZolin (ANCEF)  IVPB 2g/100 mL premix (has no administration in time range)  tranexamic acid (CYKLOKAPRON) IVPB 1,000 mg (has no administration in time range)  morphine (PF) 2 MG/ML injection 2 mg (2 mg Intravenous Given 12/26/22 1743)  morphine (PF) 2 MG/ML injection 2 mg (2 mg Intravenous Given 12/26/22 1845)    Mobility non-ambulatory     Focused Assessments Neuro Assessment Handoff:  Swallow screen pass? Yes          Neuro Assessment:   Neuro Checks:      Has TPA been given? Yes Temp: 97.5 F (36.4 C) (08/24 1600) Temp Source: Oral (08/24 1600) BP: 154/71 (08/24 1600) Pulse Rate: 86 (08/24 1600) If patient is a Neuro Trauma and patient is going to OR before floor call report to 4N Charge nurse: [phone] or [phone]   R Recommendations: See Admitting Provider Note  Report given to:   Additional Notes: evaluate pre and post surgical outcome, management of pain, pt in O2 per [place] at 3l

## 2022-12-26 NOTE — ED Provider Notes (Signed)
[place] EMERGENCY DEPARTMENT AT [facility] Provider Note   CSN: [id number] Arrival date & time: 12/26/22  1551     History  Chief Complaint  Patient presents with   Fall   Hip Pain    Brianna Bush is a 89 y.o. female.   Fall  Hip Pain  89 year old female history of GERD, hypertension, hyperlipidemia presenting for fall.  Patient mechanical fall down 2 stairs and landed on her left hip.  She is severe pain to the left hip.  No numbness or tingling.  Unsure if she hit her head.  No headache, nausea, vomiting, neck pain.  She is not on anticoagulation.  She has no pain other than her left hip currently.  No abdominal pain or chest pain or back pain.     Home Medications Prior to Admission medications   Medication Sig Start Date End Date Taking? Authorizing Provider  alendronate (FOSAMAX) 70 MG tablet Take 70 mg by mouth every Tuesday. Take with a full glass of water on an empty stomach.    [provider]  amLODipine (NORVASC) 5 MG tablet Take 5 mg by mouth at bedtime.    [provider]  cyanocobalamin 1000 MCG tablet Take 1 tablet (1,000 mcg total) by mouth daily. 05/02/22   Glade Lloyd, MD  ferrous sulfate 325 (65 FE) MG tablet Take 1 tablet (325 mg total) by mouth daily. 05/01/22 05/01/23  Osvaldo Shipper, MD  pantoprazole (PROTONIX) 40 MG tablet Take 1 tablet (40 mg total) by mouth 2 (two) times daily before a meal. 05/02/22 07/01/22  Glade Lloyd, MD  simvastatin (ZOCOR) 10 MG tablet Take 10 mg by mouth at bedtime.    [provider]  TYLENOL 500 MG tablet Take 500-1,000 mg by mouth every 6 (six) hours as needed for mild pain or headache.    [provider]      Allergies    Atenolol-chlorthalidone    Review of Systems   Review of Systems Review of systems completed and notable as per HPI.  ROS otherwise negative.   Physical Exam Updated Vital Signs BP (!) 178/96 (BP Location: Right Arm)   Pulse 93    Temp 98.5 F (36.9 C) (Oral)   Resp (!) 22   Ht 5\' 3"  (1.6 m)   Wt 58 kg   SpO2 93%   BMI 22.65 kg/m  Physical Exam Vitals and nursing note reviewed.  Constitutional:      General: She is not in acute distress.    Appearance: She is well-developed.  HENT:     Head: Normocephalic and atraumatic.  Eyes:     Conjunctiva/sclera: Conjunctivae normal.  Cardiovascular:     Rate and Rhythm: Normal rate and regular rhythm.     Heart sounds: No murmur heard. Pulmonary:     Effort: Pulmonary effort is normal. No respiratory distress.     Breath sounds: Normal breath sounds.  Abdominal:     Palpations: Abdomen is soft.     Tenderness: There is no abdominal tenderness. There is no guarding or rebound.  Musculoskeletal:        General: No swelling.     Cervical back: Normal range of motion and neck supple. No rigidity or tenderness.     Comments: Left lower extremity shortened externally rotated.  Palpable DP and PT pulse.  Normal strength and sensation at the ankle, foot, knee.  Tenderness left hip.  Skin:    General: Skin is warm and dry.  Capillary Refill: Capillary refill takes less than 2 seconds.  Neurological:     Mental Status: She is alert.  Psychiatric:        Mood and Affect: Mood normal.     ED Results / Procedures / Treatments   Labs (all labs ordered are listed, but only abnormal results are displayed) Labs Reviewed  BASIC METABOLIC PANEL - Abnormal; Notable for the following components:      Result Value   Glucose, Bld 106 (*)    BUN 31 (*)    All other components within normal limits  CBC WITH DIFFERENTIAL/PLATELET - Abnormal; Notable for the following components:   WBC 18.8 (*)    Hemoglobin 15.4 (*)    HCT 47.7 (*)    Neutro Abs 16.1 (*)    Monocytes Absolute 1.3 (*)    Abs Immature Granulocytes 0.16 (*)    All other components within normal limits  BRAIN NATRIURETIC PEPTIDE - Abnormal; Notable for the following components:   B Natriuretic Peptide  177.2 (*)    All other components within normal limits  SURGICAL PCR SCREEN  URINALYSIS, ROUTINE W REFLEX MICROSCOPIC  CBC WITH DIFFERENTIAL/PLATELET  VITAMIN D 25 HYDROXY (VIT D DEFICIENCY, FRACTURES)  TYPE AND SCREEN    EKG EKG Interpretation Date/Time:  Saturday December 26 2022 17:23:14 EDT Ventricular Rate:  92 PR Interval:  211 QRS Duration:  95 QT Interval:  381 QTC Calculation: 469 R Axis:   253  Text Interpretation: Sinus rhythm Ventricular premature complex Borderline prolonged PR interval Left anterior fascicular block Low voltage, precordial leads Consider right ventricular hypertrophy Consider anterior infarct Confirmed by Fulton Reek [phone]) on 12/26/2022 5:36:24 PM  Radiology DG Knee Left Port  Result Date: 12/26/2022 CLINICAL DATA:  Femoral neck fracture.  Slipped and fall. EXAM: PORTABLE LEFT KNEE - 2 VIEW COMPARISON:  None Available. FINDINGS: Total knee arthroplasty identified with cemented femoral component. Patellar button. Press-Fit tibial component. Osteopenia. No obvious fracture or dislocation. No obvious joint effusion but the lateral view is limited by rotation. Vascular calcifications. IMPRESSION: Total knee arthroplasty.  Osteopenia.  Limited views Electronically Signed   By: Karen Kays M.D.   On: 12/26/2022 19:13   DG Chest Portable 1 View  Result Date: 12/26/2022 CLINICAL DATA:  Fall, left hip pain. Chest congestion for several days. EXAM: PORTABLE CHEST 1 VIEW COMPARISON:  10/01/2006 FINDINGS: Heart is borderline in size. Mild vascular congestion. No confluent airspace opacities, effusions or edema. No acute bony abnormality. Degenerative changes in the shoulders bilaterally, right greater than left. Aortic atherosclerosis. IMPRESSION: Mild cardiomegaly with vascular congestion. Electronically Signed   By: Charlett Nose M.D.   On: 12/26/2022 17:12   DG Hip Unilat With Pelvis 2-3 Views Left  Result Date: 12/26/2022 CLINICAL DATA:  Fall, left hip pain  EXAM: DG HIP (WITH OR WITHOUT PELVIS) 2-3V LEFT COMPARISON:  None Available. FINDINGS: There is a left femoral neck fracture. Mild varus angulation. No subluxation or dislocation. Degenerative changes in the hips bilaterally with joint space narrowing and spurring. SI joints are symmetric. Scoliosis and degenerative changes in the lumbar spine. Aortic atherosclerosis. No visible aneurysm. IMPRESSION: Left femoral neck fracture with varus angulation. Electronically Signed   By: Charlett Nose M.D.   On: 12/26/2022 17:10   CT Head Wo Contrast  Result Date: 12/26/2022 CLINICAL DATA:  Head trauma, minor (Age >= 65y).  Fall EXAM: CT HEAD WITHOUT CONTRAST TECHNIQUE: Contiguous axial images were obtained from the base of the skull through the vertex  without intravenous contrast. RADIATION DOSE REDUCTION: This exam was performed according to the departmental dose-optimization program which includes automated exposure control, adjustment of the mA and/or kV according to patient size and/or use of iterative reconstruction technique. COMPARISON:  None Available. FINDINGS: Brain: Mild age related volume loss. No acute intracranial abnormality. Specifically, no hemorrhage, hydrocephalus, mass lesion, acute infarction, or significant intracranial injury. Vascular: No hyperdense vessel or unexpected calcification. Skull: No acute calvarial abnormality. Sinuses/Orbits: Air-fluid levels in the maxillary sinuses bilaterally and left sphenoid sinus. Orbital soft tissues unremarkable. Mastoid air cells clear. Other: None IMPRESSION: No acute intracranial abnormality. Probable mild acute sinusitis changes Electronically Signed   By: Charlett Nose M.D.   On: 12/26/2022 17:10    Procedures Procedures    Medications Ordered in ED Medications  chlorhexidine (HIBICLENS) 4 % liquid 4 Application (has no administration in time range)  povidone-iodine 10 % swab 2 Application (has no administration in time range)  ceFAZolin (ANCEF)  IVPB 2g/100 mL premix (has no administration in time range)  tranexamic acid (CYKLOKAPRON) IVPB 1,000 mg (has no administration in time range)  morphine (PF) 2 MG/ML injection 2 mg (2 mg Intravenous Given 12/26/22 2044)  amLODipine (NORVASC) tablet 5 mg (has no administration in time range)  simvastatin (ZOCOR) tablet 10 mg (has no administration in time range)  pantoprazole (PROTONIX) EC tablet 40 mg (has no administration in time range)  acetaminophen (TYLENOL) tablet 650 mg (has no administration in time range)    Or  acetaminophen (TYLENOL) suppository 650 mg (has no administration in time range)  oxyCODONE (Oxy IR/ROXICODONE) immediate release tablet 5 mg (has no administration in time range)  morphine (PF) 2 MG/ML injection 2 mg (has no administration in time range)  ondansetron (ZOFRAN) tablet 4 mg (has no administration in time range)    Or  ondansetron (ZOFRAN) injection 4 mg (has no administration in time range)  morphine (PF) 2 MG/ML injection 2 mg (2 mg Intravenous Given 12/26/22 1743)  morphine (PF) 2 MG/ML injection 2 mg (2 mg Intravenous Given 12/26/22 1845)    ED Course/ Medical Decision Making/ A&P Clinical Course as of 12/26/22 2342  Sat Dec 26, 2022  1829 [facility] ortho: [JD]    Clinical Course User Index [JD] Laurence Spates, MD                                 Medical Decision Making Amount and/or Complexity of Data Reviewed Labs: ordered. Radiology: ordered.  Risk Prescription drug management. Decision regarding hospitalization.   Medical Decision Making:   Brianna Bush is a 89 y.o. female who presented to the ED today with mechanical fall and left hip pain.  X-ray reviewed notable for left hip fracture, neurovascularly intact.  Unsure if she hit her head, CT head without head trauma.  No other signs of injury.  Bilateral for leukocytosis, noted on UA unclear if this is reactive versus infectious.  She has some mild cardiomegaly on chest x-ray.  BMP is  unremarkable.  Will plan for orthopedic consultation or admission.   Patient placed on continuous vitals and telemetry monitoring while in ED which was reviewed periodically.  Reviewed and confirmed nursing documentation for past medical history, family history, social history.  Reassessment and Plan:   CT head unremarkable.  She has left hip fracture.  Labs notable for leukocytosis, chest x-ray without pneumonia but does have some vascular congestion.  UA, BMP are  pending.  Discussed with Dr. Carola Frost with orthopedic surgery.  Discussed with hospitalist and admitted.   Patient's presentation is most consistent with acute presentation with potential threat to life or bodily function.           Final Clinical Impression(s) / ED Diagnoses Final diagnoses:  Closed fracture of left hip, initial encounter [facility])    Rx / DC Orders ED Discharge Orders     None         Laurence Spates, MD 12/26/22 2342

## 2022-12-27 ENCOUNTER — Observation Stay (HOSPITAL_COMMUNITY): Payer: Medicare HMO

## 2022-12-27 ENCOUNTER — Other Ambulatory Visit: Payer: Self-pay

## 2022-12-27 ENCOUNTER — Encounter (HOSPITAL_COMMUNITY)
Admission: EM | Disposition: A | Discharge status: Skilled Nursing Facility | Payer: Self-pay | Source: Home / Self Care | Attending: Internal Medicine

## 2022-12-27 ENCOUNTER — Observation Stay (HOSPITAL_COMMUNITY): Payer: Medicare HMO | Admitting: Anesthesiology

## 2022-12-27 DIAGNOSIS — F439 Reaction to severe stress, unspecified: Secondary | ICD-10-CM | POA: Diagnosis not present

## 2022-12-27 DIAGNOSIS — E785 Hyperlipidemia, unspecified: Secondary | ICD-10-CM | POA: Diagnosis not present

## 2022-12-27 DIAGNOSIS — R0602 Shortness of breath: Secondary | ICD-10-CM | POA: Diagnosis not present

## 2022-12-27 DIAGNOSIS — R0902 Hypoxemia: Secondary | ICD-10-CM | POA: Diagnosis not present

## 2022-12-27 DIAGNOSIS — S72002A Fracture of unspecified part of neck of left femur, initial encounter for closed fracture: Secondary | ICD-10-CM | POA: Diagnosis not present

## 2022-12-27 DIAGNOSIS — J9601 Acute respiratory failure with hypoxia: Secondary | ICD-10-CM | POA: Diagnosis not present

## 2022-12-27 DIAGNOSIS — N1831 Chronic kidney disease, stage 3a: Secondary | ICD-10-CM | POA: Diagnosis not present

## 2022-12-27 DIAGNOSIS — I517 Cardiomegaly: Secondary | ICD-10-CM | POA: Diagnosis not present

## 2022-12-27 DIAGNOSIS — S72002D Fracture of unspecified part of neck of left femur, subsequent encounter for closed fracture with routine healing: Secondary | ICD-10-CM | POA: Diagnosis not present

## 2022-12-27 DIAGNOSIS — Z66 Do not resuscitate: Secondary | ICD-10-CM | POA: Diagnosis not present

## 2022-12-27 DIAGNOSIS — K589 Irritable bowel syndrome without diarrhea: Secondary | ICD-10-CM | POA: Diagnosis present

## 2022-12-27 DIAGNOSIS — S72012A Unspecified intracapsular fracture of left femur, initial encounter for closed fracture: Secondary | ICD-10-CM

## 2022-12-27 DIAGNOSIS — Z8542 Personal history of malignant neoplasm of other parts of uterus: Secondary | ICD-10-CM | POA: Diagnosis not present

## 2022-12-27 DIAGNOSIS — I129 Hypertensive chronic kidney disease with stage 1 through stage 4 chronic kidney disease, or unspecified chronic kidney disease: Secondary | ICD-10-CM | POA: Diagnosis not present

## 2022-12-27 DIAGNOSIS — Z888 Allergy status to other drugs, medicaments and biological substances status: Secondary | ICD-10-CM | POA: Diagnosis not present

## 2022-12-27 DIAGNOSIS — J189 Pneumonia, unspecified organism: Secondary | ICD-10-CM | POA: Diagnosis not present

## 2022-12-27 DIAGNOSIS — K219 Gastro-esophageal reflux disease without esophagitis: Secondary | ICD-10-CM | POA: Diagnosis present

## 2022-12-27 DIAGNOSIS — I5031 Acute diastolic (congestive) heart failure: Secondary | ICD-10-CM | POA: Diagnosis not present

## 2022-12-27 DIAGNOSIS — Z96642 Presence of left artificial hip joint: Secondary | ICD-10-CM | POA: Diagnosis not present

## 2022-12-27 DIAGNOSIS — I700 Atherosclerosis of aorta: Secondary | ICD-10-CM | POA: Diagnosis not present

## 2022-12-27 DIAGNOSIS — M8580 Other specified disorders of bone density and structure, unspecified site: Secondary | ICD-10-CM | POA: Diagnosis present

## 2022-12-27 DIAGNOSIS — R918 Other nonspecific abnormal finding of lung field: Secondary | ICD-10-CM | POA: Diagnosis not present

## 2022-12-27 DIAGNOSIS — W109XXA Fall (on) (from) unspecified stairs and steps, initial encounter: Secondary | ICD-10-CM | POA: Diagnosis not present

## 2022-12-27 DIAGNOSIS — Z79899 Other long term (current) drug therapy: Secondary | ICD-10-CM | POA: Diagnosis not present

## 2022-12-27 DIAGNOSIS — Z7983 Long term (current) use of bisphosphonates: Secondary | ICD-10-CM | POA: Diagnosis not present

## 2022-12-27 DIAGNOSIS — J90 Pleural effusion, not elsewhere classified: Secondary | ICD-10-CM | POA: Diagnosis not present

## 2022-12-27 DIAGNOSIS — N179 Acute kidney failure, unspecified: Secondary | ICD-10-CM | POA: Diagnosis not present

## 2022-12-27 DIAGNOSIS — J690 Pneumonitis due to inhalation of food and vomit: Secondary | ICD-10-CM | POA: Diagnosis not present

## 2022-12-27 DIAGNOSIS — Z471 Aftercare following joint replacement surgery: Secondary | ICD-10-CM | POA: Diagnosis not present

## 2022-12-27 DIAGNOSIS — I110 Hypertensive heart disease with heart failure: Secondary | ICD-10-CM | POA: Diagnosis not present

## 2022-12-27 HISTORY — PX: ANTERIOR APPROACH HEMI HIP ARTHROPLASTY: SHX6690

## 2022-12-27 LAB — COMPREHENSIVE METABOLIC PANEL
ALT: 18 U/L (ref 0–44)
AST: 26 U/L (ref 15–41)
Albumin: 2.9 g/dL — ABNORMAL LOW (ref 3.5–5.0)
Alkaline Phosphatase: 55 U/L (ref 38–126)
Anion gap: 14 (ref 5–15)
BUN: 27 mg/dL — ABNORMAL HIGH (ref 8–23)
CO2: 19 mmol/L — ABNORMAL LOW (ref 22–32)
Calcium: 8.8 mg/dL — ABNORMAL LOW (ref 8.9–10.3)
Chloride: 104 mmol/L (ref 98–111)
Creatinine, Ser: 0.74 mg/dL (ref 0.44–1.00)
GFR, Estimated: >60 mL/min (ref >60)
Glucose, Bld: 122 mg/dL — ABNORMAL HIGH (ref 70–99)
Potassium: 4.5 mmol/L (ref 3.5–5.1)
Sodium: 137 mmol/L (ref 135–145)
Total Bilirubin: 2.9 mg/dL — ABNORMAL HIGH (ref 0.3–1.2)
Total Protein: 6.1 g/dL — ABNORMAL LOW (ref 6.5–8.1)

## 2022-12-27 LAB — CBC WITH DIFFERENTIAL/PLATELET
Abs Immature Granulocytes: 0.08 10*3/uL — ABNORMAL HIGH (ref 0.00–0.07)
Basophils Absolute: 0 10*3/uL (ref 0.0–0.1)
Basophils Relative: 0 %
Eosinophils Absolute: 0 10*3/uL (ref 0.0–0.5)
Eosinophils Relative: 0 %
HCT: 45.8 % (ref 36.0–46.0)
Hemoglobin: 14.4 g/dL (ref 12.0–15.0)
Immature Granulocytes: 1 %
Lymphocytes Relative: 3 %
Lymphs Abs: 0.4 10*3/uL — ABNORMAL LOW (ref 0.7–4.0)
MCH: 31.4 pg (ref 26.0–34.0)
MCHC: 31.4 g/dL (ref 30.0–36.0)
MCV: 100 fL (ref 80.0–100.0)
Monocytes Absolute: 0.6 10*3/uL (ref 0.1–1.0)
Monocytes Relative: 4 %
Neutro Abs: 15.1 10*3/uL — ABNORMAL HIGH (ref 1.7–7.7)
Neutrophils Relative %: 92 %
Platelets: 271 10*3/uL (ref 150–400)
RBC: 4.58 MIL/uL (ref 3.87–5.11)
RDW: 14.6 % (ref 11.5–15.5)
WBC: 16.2 10*3/uL — ABNORMAL HIGH (ref 4.0–10.5)
nRBC: 0 % (ref 0.0–0.2)

## 2022-12-27 LAB — URINALYSIS, ROUTINE W REFLEX MICROSCOPIC
Bilirubin Urine: NEGATIVE
Glucose, UA: NEGATIVE mg/dL
Hgb urine dipstick: NEGATIVE
Ketones, ur: 5 mg/dL — AB
Leukocytes,Ua: NEGATIVE
Nitrite: NEGATIVE
Protein, ur: 100 mg/dL — AB
Specific Gravity, Urine: 1.02 (ref 1.005–1.030)
pH: 5 (ref 5.0–8.0)

## 2022-12-27 LAB — MAGNESIUM: Magnesium: 2.1 mg/dL (ref 1.7–2.4)

## 2022-12-27 LAB — VITAMIN D 25 HYDROXY (VIT D DEFICIENCY, FRACTURES): Vit D, 25-Hydroxy: 68.06 ng/mL (ref 30–100)

## 2022-12-27 SURGERY — HEMIARTHROPLASTY, HIP, DIRECT ANTERIOR APPROACH, FOR FRACTURE
Anesthesia: General | Site: Hip | Laterality: Left

## 2022-12-27 MED ORDER — OXYCODONE HCL 5 MG PO TABS
ORAL_TABLET | ORAL | Status: AC
  Filled 2022-12-27: qty 1

## 2022-12-27 MED ORDER — PROPOFOL 10 MG/ML IV BOLUS
INTRAVENOUS | Status: AC
  Filled 2022-12-27: qty 20

## 2022-12-27 MED ORDER — MENTHOL 3 MG MT LOZG: 1.0000 | LOZENGE | OROMUCOSAL | Status: DC | PRN

## 2022-12-27 MED ORDER — HYDROCODONE-ACETAMINOPHEN 5-325 MG PO TABS
1.0000 | ORAL_TABLET | ORAL | Status: DC | PRN
  Administered 2022-12-29: 1 via ORAL
  Administered 2022-12-30: 2 via ORAL
  Administered 2022-12-30: 1 via ORAL
  Filled 2022-12-27 (×5): qty 1

## 2022-12-27 MED ORDER — PHENYLEPHRINE HCL (PRESSORS) 10 MG/ML IV SOLN
INTRAVENOUS | Status: AC
  Filled 2022-12-27: qty 1

## 2022-12-27 MED ORDER — ROCURONIUM BROMIDE 50 MG/5ML IV SOSY
PREFILLED_SYRINGE | INTRAVENOUS | Status: DC | PRN
  Administered 2022-12-27: 50 mg via INTRAVENOUS

## 2022-12-27 MED ORDER — OXYCODONE HCL 5 MG/5ML PO SOLN: 5.0000 mg | Freq: Once | ORAL | Status: AC | PRN

## 2022-12-27 MED ORDER — ONDANSETRON HCL 4 MG/2ML IJ SOLN
INTRAMUSCULAR | Status: AC
  Filled 2022-12-27: qty 2

## 2022-12-27 MED ORDER — IPRATROPIUM-ALBUTEROL 0.5-2.5 (3) MG/3ML IN SOLN: 3.0000 mL | RESPIRATORY_TRACT | Status: DC | PRN

## 2022-12-27 MED ORDER — METHOCARBAMOL 500 MG PO TABS
500.0000 mg | ORAL_TABLET | Freq: Four times a day (QID) | ORAL | Status: DC | PRN
  Administered 2022-12-29: 500 mg via ORAL
  Filled 2022-12-27: qty 1

## 2022-12-27 MED ORDER — OXYCODONE HCL 5 MG PO TABS: 5.0000 mg | ORAL_TABLET | ORAL | Status: DC | PRN

## 2022-12-27 MED ORDER — ACETAMINOPHEN 500 MG PO TABS
1000.0000 mg | ORAL_TABLET | Freq: Four times a day (QID) | ORAL | Status: DC
  Administered 2022-12-27 – 2022-12-31 (×15): 1000 mg via ORAL
  Filled 2022-12-27 (×17): qty 2

## 2022-12-27 MED ORDER — AMISULPRIDE (ANTIEMETIC) 5 MG/2ML IV SOLN: 10.0000 mg | Freq: Once | INTRAVENOUS | Status: DC | PRN

## 2022-12-27 MED ORDER — MORPHINE SULFATE (PF) 2 MG/ML IV SOLN
0.5000 mg | INTRAVENOUS | Status: DC | PRN
  Administered 2022-12-29: 1 mg via INTRAVENOUS
  Filled 2022-12-27: qty 1

## 2022-12-27 MED ORDER — ACETAMINOPHEN 10 MG/ML IV SOLN
INTRAVENOUS | Status: DC | PRN
Start: 2022-12-27 — End: 2022-12-27
  Administered 2022-12-27: 1000 mg via INTRAVENOUS

## 2022-12-27 MED ORDER — PROPOFOL 10 MG/ML IV BOLUS
INTRAVENOUS | Status: DC | PRN
  Administered 2022-12-27: 100 mg via INTRAVENOUS

## 2022-12-27 MED ORDER — LIDOCAINE 2% (20 MG/ML) 5 ML SYRINGE
INTRAMUSCULAR | Status: DC | PRN
  Administered 2022-12-27: 60 mg via INTRAVENOUS

## 2022-12-27 MED ORDER — ONDANSETRON HCL 4 MG/2ML IJ SOLN: 4.0000 mg | Freq: Four times a day (QID) | INTRAMUSCULAR | Status: DC | PRN

## 2022-12-27 MED ORDER — KETOROLAC TROMETHAMINE 15 MG/ML IJ SOLN
15.0000 mg | Freq: Once | INTRAMUSCULAR | Status: AC
  Administered 2022-12-27: 15 mg via INTRAVENOUS

## 2022-12-27 MED ORDER — LACTATED RINGERS IV SOLN: INTRAVENOUS | Status: DC | PRN

## 2022-12-27 MED ORDER — PROMETHAZINE HCL 25 MG/ML IJ SOLN: 6.2500 mg | INTRAMUSCULAR | Status: DC | PRN

## 2022-12-27 MED ORDER — ACETAMINOPHEN 10 MG/ML IV SOLN
INTRAVENOUS | Status: AC
  Filled 2022-12-27: qty 100

## 2022-12-27 MED ORDER — HYDROCODONE-ACETAMINOPHEN 7.5-325 MG PO TABS: 1.0000 | ORAL_TABLET | ORAL | Status: DC | PRN

## 2022-12-27 MED ORDER — KETOROLAC TROMETHAMINE 15 MG/ML IJ SOLN
INTRAMUSCULAR | Status: AC
  Filled 2022-12-27: qty 1

## 2022-12-27 MED ORDER — HYDROMORPHONE HCL 1 MG/ML IJ SOLN: 0.2500 mg | INTRAMUSCULAR | Status: DC | PRN

## 2022-12-27 MED ORDER — 0.9 % SODIUM CHLORIDE (POUR BTL) OPTIME
TOPICAL | Status: DC | PRN
  Administered 2022-12-27: 1000 mL

## 2022-12-27 MED ORDER — PHENYLEPHRINE 80 MCG/ML (10ML) SYRINGE FOR IV PUSH (FOR BLOOD PRESSURE SUPPORT)
PREFILLED_SYRINGE | INTRAVENOUS | Status: DC | PRN
  Administered 2022-12-27 (×4): 160 ug via INTRAVENOUS

## 2022-12-27 MED ORDER — ONDANSETRON HCL 4 MG/2ML IJ SOLN
INTRAMUSCULAR | Status: DC | PRN
  Administered 2022-12-27: 4 mg via INTRAVENOUS

## 2022-12-27 MED ORDER — SODIUM CHLORIDE 0.9 % IR SOLN
Status: DC | PRN
Start: 2022-12-27 — End: 2022-12-27
  Administered 2022-12-27: 1000 mL

## 2022-12-27 MED ORDER — CEFAZOLIN SODIUM-DEXTROSE 2-4 GM/100ML-% IV SOLN
INTRAVENOUS | Status: AC
  Filled 2022-12-27: qty 100

## 2022-12-27 MED ORDER — PHENOL 1.4 % MT LIQD: 1.0000 | OROMUCOSAL | Status: DC | PRN

## 2022-12-27 MED ORDER — TRANEXAMIC ACID-NACL 1000-0.7 MG/100ML-% IV SOLN
INTRAVENOUS | Status: AC
  Filled 2022-12-27: qty 100

## 2022-12-27 MED ORDER — METOCLOPRAMIDE HCL 10 MG PO TABS: 5.0000 mg | ORAL_TABLET | Freq: Three times a day (TID) | ORAL | Status: DC | PRN

## 2022-12-27 MED ORDER — FENTANYL CITRATE (PF) 100 MCG/2ML IJ SOLN
INTRAMUSCULAR | Status: AC
  Filled 2022-12-27: qty 2

## 2022-12-27 MED ORDER — DEXAMETHASONE SODIUM PHOSPHATE 10 MG/ML IJ SOLN
INTRAMUSCULAR | Status: DC | PRN
  Administered 2022-12-27: 4 mg via INTRAVENOUS

## 2022-12-27 MED ORDER — CEFAZOLIN SODIUM-DEXTROSE 1-4 GM/50ML-% IV SOLN
1.0000 g | Freq: Four times a day (QID) | INTRAVENOUS | Status: AC
  Administered 2022-12-27 – 2022-12-28 (×2): 1 g via INTRAVENOUS
  Filled 2022-12-27 (×2): qty 50

## 2022-12-27 MED ORDER — ACETAMINOPHEN 325 MG PO TABS
325.0000 mg | ORAL_TABLET | Freq: Four times a day (QID) | ORAL | Status: DC | PRN
  Administered 2022-12-29: 325 mg via ORAL
  Filled 2022-12-27: qty 1

## 2022-12-27 MED ORDER — DEXAMETHASONE SODIUM PHOSPHATE 10 MG/ML IJ SOLN
INTRAMUSCULAR | Status: AC
  Filled 2022-12-27: qty 1

## 2022-12-27 MED ORDER — DOCUSATE SODIUM 100 MG PO CAPS
100.0000 mg | ORAL_CAPSULE | Freq: Two times a day (BID) | ORAL | Status: DC
  Administered 2022-12-27 – 2022-12-28 (×2): 100 mg via ORAL
  Filled 2022-12-27 (×2): qty 1

## 2022-12-27 MED ORDER — SUGAMMADEX SODIUM 200 MG/2ML IV SOLN
INTRAVENOUS | Status: DC | PRN
  Administered 2022-12-27: 120 mg via INTRAVENOUS

## 2022-12-27 MED ORDER — OXYCODONE HCL 5 MG PO TABS
5.0000 mg | ORAL_TABLET | Freq: Once | ORAL | Status: AC | PRN
  Administered 2022-12-27: 5 mg via ORAL

## 2022-12-27 MED ORDER — KETOROLAC TROMETHAMINE 15 MG/ML IJ SOLN: 15.0000 mg | Freq: Four times a day (QID) | INTRAMUSCULAR | Status: AC | PRN

## 2022-12-27 MED ORDER — SODIUM CHLORIDE 0.9 % IV SOLN: INTRAVENOUS | Status: DC

## 2022-12-27 MED ORDER — PHENYLEPHRINE HCL-NACL 20-0.9 MG/250ML-% IV SOLN
INTRAVENOUS | Status: DC | PRN
  Administered 2022-12-27: 50 ug/min via INTRAVENOUS

## 2022-12-27 MED ORDER — METOCLOPRAMIDE HCL 5 MG/ML IJ SOLN: 5.0000 mg | Freq: Three times a day (TID) | INTRAMUSCULAR | Status: DC | PRN

## 2022-12-27 MED ORDER — ASPIRIN 81 MG PO CHEW
81.0000 mg | CHEWABLE_TABLET | Freq: Two times a day (BID) | ORAL | Status: DC
  Administered 2022-12-27 – 2022-12-31 (×9): 81 mg via ORAL
  Filled 2022-12-27 (×9): qty 1

## 2022-12-27 MED ORDER — ONDANSETRON HCL 4 MG PO TABS: 4.0000 mg | ORAL_TABLET | Freq: Four times a day (QID) | ORAL | Status: DC | PRN

## 2022-12-27 MED ORDER — FUROSEMIDE 10 MG/ML IJ SOLN
20.0000 mg | Freq: Once | INTRAMUSCULAR | Status: AC
  Administered 2022-12-27: 20 mg via INTRAVENOUS
  Filled 2022-12-27: qty 2

## 2022-12-27 MED ORDER — DIPHENHYDRAMINE HCL 12.5 MG/5ML PO ELIX: 12.5000 mg | ORAL_SOLUTION | ORAL | Status: DC | PRN

## 2022-12-27 MED ORDER — FENTANYL CITRATE (PF) 250 MCG/5ML IJ SOLN
INTRAMUSCULAR | Status: DC | PRN
  Administered 2022-12-27: 50 ug via INTRAVENOUS
  Administered 2022-12-27 (×2): 25 ug via INTRAVENOUS

## 2022-12-27 MED ORDER — METHOCARBAMOL 500 MG IVPB - SIMPLE MED
500.0000 mg | Freq: Four times a day (QID) | INTRAVENOUS | Status: DC | PRN
  Administered 2022-12-27: 500 mg via INTRAVENOUS
  Filled 2022-12-27: qty 500

## 2022-12-27 MED ORDER — HYDROMORPHONE HCL 1 MG/ML IJ SOLN
INTRAMUSCULAR | Status: AC
  Filled 2022-12-27: qty 1

## 2022-12-27 MED ORDER — SENNOSIDES-DOCUSATE SODIUM 8.6-50 MG PO TABS
1.0000 | ORAL_TABLET | Freq: Two times a day (BID) | ORAL | Status: DC
  Administered 2022-12-27 – 2022-12-31 (×9): 1 via ORAL
  Filled 2022-12-27 (×9): qty 1

## 2022-12-27 SURGICAL SUPPLY — 53 items
BAG COUNTER SPONGE SURGICOUNT (BAG) IMPLANT
BAG SPEC THK2 15X12 ZIP CLS (MISCELLANEOUS) ×1
BAG SPNG CNTER NS LX DISP (BAG)
BAG ZIPLOCK 12X15 (MISCELLANEOUS) ×2 IMPLANT
BIPOLAR PROS AML 44 (Hips) ×1 IMPLANT
BLADE EXTENDED COATED 6.5IN (ELECTRODE) ×1 IMPLANT
BLADE HEX COATED 2.75 (ELECTRODE) ×1 IMPLANT
BLADE SAG 18X100X1.27 (BLADE) ×1 IMPLANT
BLADE SAW SGTL 73X25 THK (BLADE) ×2 IMPLANT
BRUSH FEMORAL CANAL (MISCELLANEOUS) IMPLANT
COVER SURGICAL LIGHT HANDLE (MISCELLANEOUS) ×1 IMPLANT
DRAPE HIP W/POCKET STRL (MISCELLANEOUS) ×2 IMPLANT
DRAPE INCISE IOBAN 66X45 STRL (DRAPES) ×2 IMPLANT
DRAPE POUCH INSTRU U-SHP 10X18 (DRAPES) ×1 IMPLANT
DRAPE U-SHAPE 47X51 STRL (DRAPES) ×2 IMPLANT
DRSG AQUACEL AG ADV 3.5X10 (GAUZE/BANDAGES/DRESSINGS) IMPLANT
DRSG MEPILEX POST OP 4X12 (GAUZE/BANDAGES/DRESSINGS) ×2 IMPLANT
DRSG MEPILEX POST OP 4X8 (GAUZE/BANDAGES/DRESSINGS) ×1 IMPLANT
ELECT REM PT RETURN 15FT ADLT (MISCELLANEOUS) ×1 IMPLANT
EVACUATOR 1/8 PVC DRAIN (DRAIN) IMPLANT
FACESHIELD WRAPAROUND (MASK) ×3 IMPLANT
FACESHIELD WRAPAROUND OR TEAM (MASK) ×3 IMPLANT
GAUZE PAD ABD 8X10 STRL (GAUZE/BANDAGES/DRESSINGS) ×1 IMPLANT
GAUZE SPONGE 4X4 12PLY STRL (GAUZE/BANDAGES/DRESSINGS) IMPLANT
GLOVE BIO SURGEON STRL SZ7.5 (GLOVE) ×2 IMPLANT
GLOVE BIOGEL PI IND STRL 8 (GLOVE) ×2 IMPLANT
GLOVE ECLIPSE 8.0 STRL XLNG CF (GLOVE) ×2 IMPLANT
GOWN STRL REUS W/ TWL XL LVL3 (GOWN DISPOSABLE) ×2 IMPLANT
GOWN STRL REUS W/TWL XL LVL3 (GOWN DISPOSABLE) ×1
HANDPIECE INTERPULSE COAX TIP (DISPOSABLE) ×1
HEAD BIPOLAR PROS AML 44 (Hips) IMPLANT
HEAD FEM STD 28X+1.5 STRL (Hips) IMPLANT
IMMOBILIZER KNEE 20 (SOFTGOODS) IMPLANT
IMMOBILIZER KNEE 20 THIGH 36 (SOFTGOODS) IMPLANT
KIT TURNOVER KIT A (KITS) IMPLANT
NDL MA TROC 1/2 (NEEDLE) IMPLANT
NEEDLE MA TROC 1/2 (NEEDLE) IMPLANT
PACK TOTAL JOINT (CUSTOM PROCEDURE TRAY) ×2 IMPLANT
PASSER SUT SWANSON 36MM LOOP (INSTRUMENTS) IMPLANT
PROTECTOR NERVE ULNAR (MISCELLANEOUS) ×2 IMPLANT
SET HNDPC FAN SPRY TIP SCT (DISPOSABLE) ×1 IMPLANT
SPONGE T-LAP 4X18 ~~LOC~~+RFID (SPONGE) IMPLANT
STAPLER VISISTAT 35W (STAPLE) IMPLANT
STEM CORAIL KA12 (Stem) IMPLANT
SUT ETHIBOND NAB CT1 #1 30IN (SUTURE) ×4 IMPLANT
SUT FIBERWIRE #2 38 T-5 BLUE (SUTURE) ×2
SUT VIC AB 1 CT1 36 (SUTURE) ×2 IMPLANT
SUT VIC AB 2-0 CT1 27 (SUTURE) ×2
SUT VIC AB 2-0 CT1 TAPERPNT 27 (SUTURE) ×4 IMPLANT
SUTURE FIBERWR #2 38 T-5 BLUE (SUTURE) ×2 IMPLANT
TOWEL OR 17X26 10 PK STRL BLUE (TOWEL DISPOSABLE) ×6 IMPLANT
TOWER CARTRIDGE SMART MIX (DISPOSABLE) IMPLANT
TRAY FOLEY MTR SLVR 16FR STAT (SET/KITS/TRAYS/PACK) ×1 IMPLANT

## 2022-12-27 NOTE — Progress Notes (Signed)
Patient transferred from PACU, alert and in pain, pain medications given prior to transfer from PACU, Ice pack in place., oxygen saturation 85% on 5L N/C. Patient switched to face mask on 5L and oxygen saturation up to 90. Continuous pulse ox set-up to monitor oxygen.  Dr. Allena Katz aware, assessed patient at the bedside.

## 2022-12-27 NOTE — Progress Notes (Signed)
[facility] Progress Note Patient: Brianna Bush ZOX:[id number] DOB: [birth year] DOA: 12/26/2022  DOS: the patient was seen and examined on 12/27/2022  Brief hospital course: PMH of HTN, HLD, GERD, IBS, Gilbert's disease, endometrial cancer presented to the hospital with complaints of mechanical fall.  Found to have left femoral neck fracture. 8/25 underwent left direct anterior hip hemiarthroplasty.  Transferred to progressive care for hypoxia due to acute CHF Assessment and Plan: Left femoral neck fracture. SP hip hemiarthroplasty. Pain management, DVT prophylaxis per orthopedics. Weightbearing per orthopedics. PT OT consulted.  Acute hypoxic respiratory failure Acute suspected diastolic CHF. Right pleural effusion. Will treat with IV Lasix. BNP mildly elevated. Likely in the setting of elevated hypertension. Monitor response. Repeat chest x-ray tomorrow. Incentive spirometry. Stop IV fluid.  Accelerated hypertension Blood pressure was minimally elevated at the time of admission. On amlodipine 5 mg at home which I will continue.  HLD. Continuing statin.  Leukocytosis. Likely stress reaction. Monitor.   Subjective: No nausea or vomiting no fever no chills.  No chest pain.  Postop developed hypoxia requiring transfer to progressive care unit.  Physical Exam: General: in Mild distress, No Rash Cardiovascular: S1 and S2 Present, No Murmur Respiratory: Good respiratory effort, Bilateral Air entry present.  Bilateral basal crackles, No wheezes Abdomen: Bowel Sound present, No tenderness Extremities: Trace edema Neuro: Alert and oriented x3, no new focal deficit  Data Reviewed: I have Reviewed nursing notes, Vitals, and Lab results. Since last encounter, pertinent lab results CBC and BMP   . I have ordered test including CBC and BMP  . I have ordered imaging chest x-ray  .   Disposition: Status is: Inpatient Remains inpatient appropriate because: Awaiting  further workup and improvement in hypoxia  SCDs Start: 12/27/22 1658 SCDs Start: 12/26/22 2228   Family Communication: Discussed with family at bedside Level of care: Progressive transfer due to hypoxia Vitals:   12/27/22 1345 12/27/22 1400 12/27/22 1415 12/27/22 1433  BP: (!) 141/80 131/74 131/86 135/75  Pulse: 78 74 77 79  Resp: 19 18 17 20   Temp:  98.7 F (37.1 C)  98 F (36.7 C)  TempSrc:      SpO2: (!) 89% (!) 89% (!) 88% 91%  Weight:      Height:         Author: Lynden Oxford, MD 12/27/2022 6:11 PM  Please look on [email or web address] to find out who is on call.

## 2022-12-27 NOTE — Transfer of Care (Signed)
Immediate Anesthesia Transfer of Care Note  Patient: Brianna Bush  Procedure(s) Performed: ANTERIOR APPROACH HEMI HIP ARTHROPLASTY (Left: Hip)  Patient Location: PACU  Anesthesia Type:General  Level of Consciousness: awake and patient cooperative  Airway & Oxygen Therapy: Patient Spontanous Breathing and Patient connected to face mask oxygen  Post-op Assessment: Report given to RN and Post -op Vital signs reviewed and stable  Post vital signs: Reviewed and stable  Last Vitals:  Vitals Value Taken Time  BP 154/77 12/27/22 1315  Temp    Pulse 79 12/27/22 1315  Resp 18 12/27/22 1315  SpO2 92 % 12/27/22 1315    Last Pain:  Vitals:   12/27/22 1309  TempSrc:   PainSc: Asleep         Complications: No notable events documented.

## 2022-12-27 NOTE — Anesthesia Procedure Notes (Signed)
Procedure Name: Intubation Date/Time: 12/27/2022 11:34 AM  Performed by: Adria Dill, CRNAPre-anesthesia Checklist: Patient identified, Emergency Drugs available, Suction available and Patient being monitored Patient Re-evaluated:Patient Re-evaluated prior to induction Oxygen Delivery Method: Circle system utilized Preoxygenation: Pre-oxygenation with 100% oxygen Induction Type: IV induction Ventilation: Mask ventilation without difficulty Laryngoscope Size: Miller and 2 Grade View: Grade I Tube type: Oral Tube size: 6.5 mm Number of attempts: 1 Airway Equipment and Method: Stylet and Oral airway Placement Confirmation: ETT inserted through vocal cords under direct vision, positive ETCO2 and breath sounds checked- equal and bilateral Secured at: 21 cm Tube secured with: Tape Dental Injury: Teeth and Oropharynx as per pre-operative assessment

## 2022-12-27 NOTE — Anesthesia Preprocedure Evaluation (Signed)
Anesthesia Evaluation  Patient identified by MRN, date of birth, ID band Patient awake    Reviewed: Allergy & Precautions, H&P , NPO status , Patient's Chart, lab work & pertinent test results  Airway Mallampati: III  TM Distance: >3 FB Neck ROM: Full    Dental no notable dental hx. (+) Teeth Intact, Dental Advisory Given, Caps   Pulmonary neg pulmonary ROS   Pulmonary exam normal breath sounds clear to auscultation       Cardiovascular Exercise Tolerance: Good hypertension, Pt. on medications negative cardio ROS Normal cardiovascular exam Rhythm:Regular Rate:Normal     Neuro/Psych negative neurological ROS  negative psych ROS   GI/Hepatic negative GI ROS, Neg liver ROS,GERD  ,,  Endo/Other  negative endocrine ROS    Renal/GU negative Renal ROS  negative genitourinary   Musculoskeletal negative musculoskeletal ROS (+)    Abdominal   Peds negative pediatric ROS (+)  Hematology negative hematology ROS (+)   Anesthesia Other Findings   Reproductive/Obstetrics negative OB ROS                             Anesthesia Physical Anesthesia Plan  ASA: 3  Anesthesia Plan: General   Post-op Pain Management:    Induction: Intravenous  PONV Risk Score and Plan: 3 and Ondansetron, Dexamethasone, Midazolam and Treatment may vary due to age or medical condition  Airway Management Planned: Oral ETT  Additional Equipment: None  Intra-op Plan:   Post-operative Plan: Extubation in OR  Informed Consent: I have reviewed the patients History and Physical, chart, labs and discussed the procedure including the risks, benefits and alternatives for the proposed anesthesia with the patient or authorized representative who has indicated his/her understanding and acceptance.       Plan Discussed with: Anesthesiologist and CRNA  Anesthesia Plan Comments:        Anesthesia Quick Evaluation

## 2022-12-27 NOTE — Hospital Course (Signed)
Brief hospital course: PMH of HTN, HLD, GERD, IBS, Gilbert's disease, endometrial cancer presented to the hospital with complaints of mechanical fall.  Found to have left femoral neck fracture. 8/25 underwent left direct anterior hip hemiarthroplasty.  Transferred to progressive care for hypoxia due to acute CHF Assessment and Plan: Left femoral neck fracture. SP hip hemiarthroplasty. Pain management, DVT prophylaxis per orthopedics. Weightbearing per orthopedics. PT OT consulted.  Acute hypoxic respiratory failure Acute suspected diastolic CHF. Right pleural effusion. Will treat with IV Lasix. BNP mildly elevated. Likely in the setting of elevated hypertension. Monitor response. Repeat chest x-ray tomorrow. Incentive spirometry. Stop IV fluid.  Accelerated hypertension Blood pressure was minimally elevated at the time of admission. On amlodipine 5 mg at home which I will continue.  HLD. Continuing statin.  Leukocytosis. Likely stress reaction. Monitor.

## 2022-12-27 NOTE — Anesthesia Postprocedure Evaluation (Signed)
Anesthesia Post Note  Patient: Brianna Bush  Procedure(s) Performed: ANTERIOR APPROACH HEMI HIP ARTHROPLASTY (Left: Hip)     Patient location during evaluation: PACU Anesthesia Type: General Level of consciousness: awake and alert Pain management: pain level controlled Vital Signs Assessment: post-procedure vital signs reviewed and stable Respiratory status: spontaneous breathing, nonlabored ventilation and respiratory function stable Cardiovascular status: blood pressure returned to baseline and stable Postop Assessment: no apparent nausea or vomiting Anesthetic complications: no   No notable events documented.  Last Vitals:  Vitals:   12/27/22 1433 12/27/22 1824  BP: 135/75 137/80  Pulse: 79 88  Resp: 20 16  Temp: 36.7 C 36.8 C  SpO2: 91% 93%    Last Pain:  Vitals:   12/27/22 1824  TempSrc: Oral  PainSc:                  [name]

## 2022-12-27 NOTE — Op Note (Signed)
Operative Note  Date of operation: 12/27/2022 Preoperative diagnosis: Displaced left hip subcapital femoral neck fracture Postoperative diagnosis: Same  Procedure: Left direct anterior hip hemiarthroplasty  Implants: Implant Name Type Inv. Item Serial No. Manufacturer Lot No. LRB No. Used Action  STEM CORAIL KA12 - [id number] Stem STEM CORAIL KA12  [employer] [id number] Left 1 Implanted  HEAD FEM STD 28X+1.5 STRL - [id number] Hips HEAD FEM STD 28X+1.5 STRL  [employer]PU[employer] ORTH[employer] [id number] Left 1 Implanted  BIPOLAR PROS AML 44 - [id number] Hips BIPOLAR PROS AML 44  DEPUY ORTHOPA[employer] [id number] Left 1 Implanted   Surgeon: Vanita Panda. Magnus Ivan, MD Assistant: Rexene Edison, PA-C  Anesthesia: General Antibiotics: 2 g IV Ancef EBL: 200 cc Complications: None  Indications: The patient is a 89 year old female who unfortunately sustained an accidental mechanical fall yesterday landing on her left hip.  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  She was brought to the [facility] emergency room with left hip pain and inability ambulate.  She is found to have a displaced left hip femoral neck fracture.  We have recommended a hip hemiarthroplasty to treat this fracture.  She was graciously admitted to the medicine service and cleared for surgery.  I have spoken to her and her daughter in length in the understanding that her advanced age there are certainly complications that can occur.  We did also talk about the risk of acute blood loss anemia, nerve or vessel injury, fracture, infection, DVT, dislocation, implant failure and wound healing issues as well as leg length differences.  They understand her goals are hopefully decrease pain, improve mobility, and improve quality of life.  Procedure description: After informed consent was obtained and the appropriate left hip was marked, the patient was brought to the operating  room and kept on her stretcher where general anesthesia was obtained.  Traction boots were placed on both her feet and she was next placed supine on the Hana fracture table with a perineal post in place in both legs and inline skeletal traction devices but no traction applied.  The left operative hip was then prepped and draped with DuraPrep and sterile drapes.  A timeout was called and she was notified of the correct patient and the correct left hip.  An incision was then made just inferior and posterior to the ASIS and carried slightly obliquely down the leg.  Dissection was carried down to the tensor fascia lata muscle and the tensor fascia was divided longitudinally to proceed with a direct anterior approach to the hip.  Circumflex vessels were identified and cauterized.  The hip capsule identified and opened up in L-type format finding a moderate hemarthrosis and you could see there was a completely displaced subcapital femoral neck fracture.  Cobra retractors were placed around the remnants of the medial and lateral femoral neck and a femoral neck cut was made with an oscillating saw just distal to the fracture and proximal to the lesser trochanter.  This cut was completed with an osteotome and then remnants of the fractured femoral neck were removed followed by the femoral head.  We then removed bone debris from within the acetabular itself.  Next decision was turned to the femur.  With the leg externally rotated to 120 degrees, extended and adducted, a Mueller retractor was placed medially and a Hohmann retractor was placed behind the greater trochanter.  A box cutting osteotome was used to enter  the femoral canal and the lateral joint capsule was released.  Broaching was then initiated using the Corail retching system from [employer] going from a size 8 to a size 12.  With size 12 in place we trialed a standard offset femoral neck and then a 44/28+1.5 bipolar trial hip ball.  The leg was brought over and up and  with traction and internal rotation reduced in the pelvis.  We replaced with offset as well as range of motion and stability assessing this both clinically and radiographically.  The hip was then dislocated and remove the trial components.  We placed the real size 12 femoral component with standard offset and the real 44/28+1.5 metal bipolar head ball.  Again this was reduced and acetabulum we assessed the radiographically and clinically and were pleased.  The tissue was then irrigated normal saline solution.  Remnants of the joint capsule were closed with interrupted #1 Ethibond suture followed by #1 Dr. Melene Plan tensor fascia.  0 Vicryl was used to close the deep tissue and 2-0 Vicryl was used to close the subcutaneous tissue.  The skin was closed with staples.  Aquacel dressings applied.  Patient was taken off the Hana table and awakened, extubated and taken to recovery room.  Rexene Edison, PA-C did assist during the entire case and beginning the end and his assistance was medically necessary and crucial for soft tissue management and retraction, helping guide implant placement and a layered closure of the wound.

## 2022-12-27 NOTE — Plan of Care (Signed)
  Problem: Coping: Goal: Level of anxiety will decrease Outcome: Progressing   Problem: Pain Managment: Goal: General experience of comfort will improve Outcome: Progressing   Problem: Safety: Goal: Ability to remain free from injury will improve Outcome: Progressing   

## 2022-12-28 ENCOUNTER — Inpatient Hospital Stay (HOSPITAL_COMMUNITY): Payer: Medicare HMO

## 2022-12-28 ENCOUNTER — Encounter (HOSPITAL_COMMUNITY): Payer: Self-pay | Admitting: Orthopaedic Surgery

## 2022-12-28 DIAGNOSIS — S72002A Fracture of unspecified part of neck of left femur, initial encounter for closed fracture: Secondary | ICD-10-CM | POA: Diagnosis not present

## 2022-12-28 DIAGNOSIS — I5031 Acute diastolic (congestive) heart failure: Secondary | ICD-10-CM

## 2022-12-28 LAB — CBC
HCT: 41.9 % (ref 36.0–46.0)
Hemoglobin: 13.1 g/dL (ref 12.0–15.0)
MCH: 31.3 pg (ref 26.0–34.0)
MCHC: 31.3 g/dL (ref 30.0–36.0)
MCV: 100.2 fL — ABNORMAL HIGH (ref 80.0–100.0)
Platelets: 242 10*3/uL (ref 150–400)
RBC: 4.18 MIL/uL (ref 3.87–5.11)
RDW: 14.6 % (ref 11.5–15.5)
WBC: 13.6 10*3/uL — ABNORMAL HIGH (ref 4.0–10.5)
nRBC: 0 % (ref 0.0–0.2)

## 2022-12-28 LAB — ECHOCARDIOGRAM COMPLETE
AR max vel: 2.27 cm2
AV Area VTI: 2.51 cm2
AV Area mean vel: 2.67 cm2
AV Mean grad: 4 mmHg
AV Peak grad: 6.6 mmHg
Ao pk vel: 1.28 m/s
Area-P 1/2: 2.56 cm2
Height: 63 in
S' Lateral: 2.1 cm
Weight: 2045.87 oz

## 2022-12-28 LAB — BASIC METABOLIC PANEL
Anion gap: 9 (ref 5–15)
BUN: 50 mg/dL — ABNORMAL HIGH (ref 8–23)
CO2: 20 mmol/L — ABNORMAL LOW (ref 22–32)
Calcium: 8.2 mg/dL — ABNORMAL LOW (ref 8.9–10.3)
Chloride: 109 mmol/L (ref 98–111)
Creatinine, Ser: 1.21 mg/dL — ABNORMAL HIGH (ref 0.44–1.00)
GFR, Estimated: 41 mL/min — ABNORMAL LOW (ref >60)
Glucose, Bld: 156 mg/dL — ABNORMAL HIGH (ref 70–99)
Potassium: 4 mmol/L (ref 3.5–5.1)
Sodium: 138 mmol/L (ref 135–145)

## 2022-12-28 LAB — PROCALCITONIN: Procalcitonin: 0.7 ng/mL

## 2022-12-28 LAB — MAGNESIUM: Magnesium: 2.4 mg/dL (ref 1.7–2.4)

## 2022-12-28 MED ORDER — ALBUMIN HUMAN 5 % IV SOLN
12.5000 g | Freq: Once | INTRAVENOUS | Status: AC
  Administered 2022-12-28: 12.5 g via INTRAVENOUS
  Filled 2022-12-28: qty 250

## 2022-12-28 MED ORDER — BENZONATATE 100 MG PO CAPS
100.0000 mg | ORAL_CAPSULE | Freq: Three times a day (TID) | ORAL | Status: DC
  Administered 2022-12-28 – 2022-12-31 (×12): 100 mg via ORAL
  Filled 2022-12-28 (×12): qty 1

## 2022-12-28 MED ORDER — FUROSEMIDE 10 MG/ML IJ SOLN
20.0000 mg | Freq: Once | INTRAMUSCULAR | Status: AC
  Administered 2022-12-28: 20 mg via INTRAVENOUS
  Filled 2022-12-28: qty 2

## 2022-12-28 MED ORDER — SODIUM CHLORIDE 0.9 % IV SOLN
3.0000 g | Freq: Two times a day (BID) | INTRAVENOUS | Status: DC
  Administered 2022-12-28 – 2022-12-30 (×4): 3 g via INTRAVENOUS
  Filled 2022-12-28 (×4): qty 8

## 2022-12-28 MED ORDER — GUAIFENESIN ER 600 MG PO TB12
600.0000 mg | ORAL_TABLET | Freq: Two times a day (BID) | ORAL | Status: DC
  Administered 2022-12-28 – 2022-12-31 (×8): 600 mg via ORAL
  Filled 2022-12-28 (×8): qty 1

## 2022-12-28 NOTE — Plan of Care (Signed)
  Problem: Pain Management: Goal: Pain level will decrease with appropriate interventions Outcome: Progressing   Problem: Skin Integrity: Goal: Will show signs of wound healing Outcome: Progressing   Problem: Clinical Measurements: Goal: Postoperative complications will be avoided or minimized Outcome: Progressing   Problem: Safety: Goal: Ability to remain free from injury will improve Outcome: Progressing   Problem: Coping: Goal: Level of anxiety will decrease Outcome: Progressing

## 2022-12-28 NOTE — Evaluation (Signed)
Physical Therapy Evaluation Patient Details Name: Brianna Bush MRN: [id number] DOB: 1923/10/16 Today's Date: 12/28/2022  History of Present Illness  Pt is 89 yo female admitted 12/26/22 after fall with L femoral neck fracture.  He is s/p L hip anterior hemiarthroplasty on 12/27/22.  Pt with hx including GERD, HTN, and HLD.  Clinical Impression  Pt admitted with above diagnosis. At baseline, pt ambulatory without AD.  Today, pt requiring mod-mod A of 2 for transfers and to take a few steps to the chair.  She was on 5 L O2 with sats dropping to 88% with activity needing cues to focus on breathing.   Pt currently with functional limitations due to the deficits listed below (see PT Problem List). Pt will benefit from acute skilled PT to increase their independence and safety with mobility to allow discharge.  Patient will benefit from continued inpatient follow up therapy, <3 hours/day at d/c.          If plan is discharge home, recommend the following: A lot of help with walking and/or transfers;A lot of help with bathing/dressing/bathroom   Can travel by private vehicle   No    Equipment Recommendations Other (comment) (defer to post acute)  Recommendations for Other Services       Functional Status Assessment Patient has had a recent decline in their functional status and demonstrates the ability to make significant improvements in function in a reasonable and predictable amount of time.     Precautions / Restrictions Precautions Precautions: Fall Restrictions LLE Weight Bearing: Weight bearing as tolerated      Mobility  Bed Mobility Overal bed mobility: Needs Assistance Bed Mobility: Supine to Sit     Supine to sit: Mod assist     General bed mobility comments: Increased time with assist for bil LE and trunk    Transfers Overall transfer level: Needs assistance Equipment used: Rolling walker (2 wheels) Transfers: Sit to/from Stand, Bed to chair/wheelchair/BSC Sit  to Stand: Mod assist, +2 physical assistance   Step pivot transfers: Mod assist, +2 physical assistance       General transfer comment: Cues for RW, sequencing, with mod A of 2 for small steps to chair    Ambulation/Gait Ambulation/Gait assistance: Mod assist, +2 physical assistance Gait Distance (Feet): 2 Feet Assistive device: Rolling walker (2 wheels) Gait Pattern/deviations: Decreased stride length, Step-to pattern, Decreased weight shift to left Gait velocity: decreased     General Gait Details: Small steps to chair only; limited by pain and shortness of breath  Stairs            Wheelchair Mobility     Tilt Bed    Modified Rankin (Stroke Patients Only)       Balance Overall balance assessment: Needs assistance Sitting-balance support: Bilateral upper extremity supported Sitting balance-Leahy Scale: Poor Sitting balance - Comments: needs UE support   Standing balance support: Bilateral upper extremity supported Standing balance-Leahy Scale: Poor Standing balance comment: RW and mod A                             Pertinent Vitals/Pain Pain Assessment Pain Assessment: Faces Faces Pain Scale: Hurts even more Pain Location: L hip with movement Pain Descriptors / Indicators: Discomfort, Grimacing, Guarding Pain Intervention(s): Limited activity within patient's tolerance, Monitored during session, Premedicated before session    Home Living Family/patient expects to be discharged to:: Private residence Living Arrangements: Alone Available Help at Discharge: Family;Available  PRN/intermittently;Other (Comment) (aide 1 x week) Type of Home: House       Alternate Level Stairs-Number of Steps: She resides on main level; does have 2 steps down to great room Home Layout: Other (Comment);Multi-level Home Equipment: Shower seat;Hand held shower head;Cane - single point;Rolling Walker (2 wheels)      Prior Function Prior Level of Function :  Independent/Modified Independent             Mobility Comments: Ambulatory without AD; could do short community distance ADLs Comments: Pt independent with ADLs; does not drive; light iadls on her own; has assist with heavier iadls (aide 1 x week)     Extremity/Trunk Assessment   Upper Extremity Assessment Upper Extremity Assessment: Overall WFL for tasks assessed    Lower Extremity Assessment Lower Extremity Assessment: LLE deficits/detail;RLE deficits/detail RLE Deficits / Details: ROM WFL; MMT 5/5 LLE Deficits / Details: Expected post-op changes; ROM WFL; MMT: ankle 5/5, knee 2/5, hip 1/5    Cervical / Trunk Assessment Cervical / Trunk Assessment: Kyphotic  Communication   Communication Communication: Hearing impairment  Cognition Arousal: Alert Behavior During Therapy: WFL for tasks assessed/performed Overall Cognitive Status: Within Functional Limits for tasks assessed                                          General Comments General comments (skin integrity, edema, etc.): Pt on 5 L with sats 98% rest and down to 88% on transfers.  Pt needing cues to focus on breathing at times    Exercises     Assessment/Plan    PT Assessment Patient needs continued PT services  PT Problem List Decreased strength;Pain;Decreased range of motion;Decreased activity tolerance;Decreased balance;Decreased mobility;Decreased knowledge of use of DME;Decreased safety awareness       PT Treatment Interventions DME instruction;Therapeutic exercise;Gait training;Balance training;Functional mobility training;Therapeutic activities;Patient/family education;Modalities    PT Goals (Current goals can be found in the Care Plan section)  Acute Rehab PT Goals Patient Stated Goal: return to walking; agreeable to post acute rehab PT Goal Formulation: With patient/family Time For Goal Achievement: 01/11/23 Potential to Achieve Goals: Good    Frequency Min 1X/week      Co-evaluation               AM-PAC PT "6 Clicks" Mobility  Outcome Measure Help needed turning from your back to your side while in a flat bed without using bedrails?: A Lot Help needed moving from lying on your back to sitting on the side of a flat bed without using bedrails?: A Lot Help needed moving to and from a bed to a chair (including a wheelchair)?: Total Help needed standing up from a chair using your arms (e.g., wheelchair or bedside chair)?: Total Help needed to walk in hospital room?: Total Help needed climbing 3-5 steps with a railing? : Total 6 Click Score: 8    End of Session Equipment Utilized During Treatment: Gait belt Activity Tolerance: Patient tolerated treatment well Patient left: with chair alarm set;in chair;with call bell/phone within reach;with family/visitor present Nurse Communication: Mobility status PT Visit Diagnosis: Other abnormalities of gait and mobility (R26.89);Muscle weakness (generalized) (M62.81);Pain Pain - Right/Left: Left Pain - part of body: Hip    Time: 1210-1241 PT Time Calculation (min) (ACUTE ONLY): 31 min   Charges:   PT Evaluation $PT Eval Low Complexity: 1 Low PT Treatments $Therapeutic Activity: 8-22 mins  PT General Charges $$ ACUTE PT VISIT: 1 Visit         Anise Salvo, PT Acute Rehab Services [facility] [phone]   Rayetta Humphrey 12/28/2022, 2:21 PM

## 2022-12-28 NOTE — Progress Notes (Signed)
[facility] Progress Note Patient: Brianna Bush ZOX:[id number] DOB: 1923-11-27 DOA: 12/26/2022  DOS: the patient was seen and examined on 12/28/2022  Brief hospital course: PMH of HTN, HLD, GERD, IBS, Gilbert's disease, endometrial cancer presented to the hospital with complaints of mechanical fall.  Found to have left femoral neck fracture. 8/25 underwent left direct anterior hip hemiarthroplasty.  Transferred to progressive care for hypoxia due to acute CHF Assessment and Plan: Left femoral neck fracture. SP hip hemiarthroplasty. Pain management, DVT prophylaxis per orthopedics. Weightbearing per orthopedics. PT OT consulted.  Acute hypoxic respiratory failure Acute suspected diastolic CHF. Right pleural effusion. Possible right-sided pneumonia. Treated with IV Lasix.  BNP elevated at the time of admission.  Serum creatinine mildly trending up. Will give IV albumin all as well. Add antibiotic. Monitor.  Accelerated hypertension Blood pressure was minimally elevated at the time of admission. On amlodipine 5 mg at home which I will continue.  HLD. Continuing statin.  Leukocytosis. Add antibiotic.  For possible pneumonia.  Monitor.   Subjective: No nausea no vomiting.  Has some dry cough.  Physical Exam: General: in Mild distress, No Rash Cardiovascular: S1 and S2 Present, No Murmur Respiratory: Good respiratory effort, Bilateral Air entry present.  Right-sided crackles, No wheezes Abdomen: Bowel Sound present, No tenderness Extremities: No edema Neuro: Alert and oriented x3, no new focal deficit  Data Reviewed: I have Reviewed nursing notes, Vitals, and Lab results. Since last encounter, pertinent lab results CBC and BMP   . I have ordered test including CBC and BMP  .  Reviewed chest x-ray independently and visualized.  Disposition: Status is: Inpatient Remains inpatient appropriate because: Needing further stability  SCDs Start: 12/27/22 1658 SCDs  Start: 12/26/22 2228   Family Communication: Discussed with daughter on 8/25. Level of care: Progressive   Vitals:   12/28/22 0845 12/28/22 0900 12/28/22 1346 12/28/22 1835  BP:   122/62 128/73  Pulse:   85 89  Resp:   18 20  Temp:   98.9 F (37.2 C) 98.2 F (36.8 C)  TempSrc:   Oral Oral  SpO2: (!) 89% 90% 94% 94%  Weight:      Height:         Author: Lynden Oxford, MD 12/28/2022 7:03 PM  Please look on [email or web address] to find out who is on call.

## 2022-12-28 NOTE — Progress Notes (Signed)
Patient ID: Brianna Bush, female   DOB: July 26, 1923, 89 y.o.   MRN: [id number] Surgery was performed yesterday on the patient's left hip due to a displaced femoral neck fracture.  Prior to surgery, she has had a significant upper respiratory illness that was getting better.  Surgery had to be done under general anesthesia yesterday.  She has had an increased oxygen requirement after surgery.  I was able to easily wake her up this morning.  She does report left hip pain appropriately.  Her leg lengths appear equal and her left hip dressing is clean and dry.  I did place some x-rays by the bedside showing the left hip hemiarthroplasty.  From an orthopedic standpoint, physical therapy can work with the patient with attempts at mobility.  She did live at home before.  She may end up needing short-term skilled nursing after this acute hospitalization.

## 2022-12-28 NOTE — [place] FL2 (Signed)
[place] MEDICAID FL2 LEVEL OF CARE FORM     IDENTIFICATION  Patient Name: Brianna Bush Birthdate: [birth year] Sex: female Admission Date (Current Location): 12/26/2022  [facility] and [place] Number:  Producer, television/film/video and Address:  [facility],  [address], [place] [address]      Provider Number: [id number]  Attending Physician Name and Address:  Rolly Salter, MD  Relative Name and Phone Number:  Tjuana, Farell (Daughter)  [phone] [facility])    Current Level of Care: Hospital Recommended Level of Care: Skilled Nursing Facility Prior Approval Number:    Date Approved/Denied:   PASRR Number: [id number]  Discharge Plan: SNF    Current Diagnoses: Patient Active Problem List   Diagnosis Date Noted   Closed subcapital fracture of left femur (HCC) 12/27/2022   Acute diastolic (congestive) heart failure (HCC) 12/27/2022   Hip fracture (HCC) 12/26/2022   Closed fracture of neck of left femur (HCC) 12/26/2022   Acute upper GI bleed 04/30/2022   GI bleeding 04/29/2022   Macrocytic anemia 04/29/2022   Colitis 10/11/2015   Hypokalemia 10/11/2015   Stage 3a chronic kidney disease (CKD) (HCC) 10/11/2015   GERD (gastroesophageal reflux disease) 10/11/2015   Leg wound, left 10/11/2015   Hypertension 10/11/2015   Hyperlipidemia 10/11/2015   Endometrial carcinoma (HCC) 10/11/2015   Colitis, acute 10/11/2015    Orientation RESPIRATION BLADDER Height & Weight     Self, Place, Time  O2 Incontinent, External catheter Weight: 58 kg Height:  5\' 3"  (160 cm)  BEHAVIORAL SYMPTOMS/MOOD NEUROLOGICAL BOWEL NUTRITION STATUS      Incontinent Diet (regular diet)  AMBULATORY STATUS COMMUNICATION OF NEEDS Skin   Extensive Assist Verbally Other (Comment) (Blister RLE, Ecchymosis on bilateral hands, arms, legs and buttocks)                       Personal Care Assistance Level of Assistance  Bathing, Feeding, Dressing Bathing Assistance: Limited  assistance Feeding assistance: Limited assistance Dressing Assistance: Limited assistance     Functional Limitations Info  Sight, Hearing, Speech Sight Info: Impaired Hearing Info: Impaired Speech Info: Adequate    SPECIAL CARE FACTORS FREQUENCY  PT (By licensed PT), OT (By licensed OT)     PT Frequency: 5x/wk OT Frequency: 5x/wk            Contractures Contractures Info: Not present    Additional Factors Info  Code Status, Allergies, Psychotropic Code Status Info: DNR Allergies Info: Tape, Atenolol-chlorthalidone Psychotropic Info: N/A         Current Medications (12/28/2022):  This is the current hospital active medication list Current Facility-Administered Medications  Medication Dose Route Frequency Provider Last Rate Last Admin   acetaminophen (TYLENOL) tablet 1,000 mg  1,000 mg Oral Q6H Rolly Salter, MD   1,000 mg at 12/28/22 1059   acetaminophen (TYLENOL) tablet 325-650 mg  325-650 mg Oral Q6H PRN Kathryne Hitch, MD       amLODipine (NORVASC) tablet 5 mg  5 mg Oral QHS Kathryne Hitch, MD   5 mg at 12/27/22 2227   aspirin chewable tablet 81 mg  81 mg Oral BID Kathryne Hitch, MD   81 mg at 12/28/22 0847   benzonatate (TESSALON) capsule 100 mg  100 mg Oral TID Rolly Salter, MD   100 mg at 12/28/22 1048   diphenhydrAMINE (BENADRYL) 12.5 MG/5ML elixir 12.5-25 mg  12.5-25 mg Oral Q4H PRN Kathryne Hitch, MD  docusate sodium (COLACE) capsule 100 mg  100 mg Oral BID Kathryne Hitch, MD   100 mg at 12/28/22 0846   guaiFENesin (MUCINEX) 12 hr tablet 600 mg  600 mg Oral BID Rolly Salter, MD   600 mg at 12/28/22 1048   HYDROcodone-acetaminophen (NORCO) 7.5-325 MG per tablet 1-2 tablet  1-2 tablet Oral Q4H PRN Kathryne Hitch, MD       HYDROcodone-acetaminophen (NORCO/VICODIN) 5-325 MG per tablet 1-2 tablet  1-2 tablet Oral Q4H PRN Kathryne Hitch, MD       ipratropium-albuterol (DUONEB) 0.5-2.5 (3) MG/3ML  nebulizer solution 3 mL  3 mL Nebulization Q4H PRN Rolly Salter, MD       ketorolac (TORADOL) 15 MG/ML injection 15 mg  15 mg Intravenous Q6H PRN Rolly Salter, MD       menthol-cetylpyridinium (CEPACOL) lozenge 3 mg  1 lozenge Oral PRN Kathryne Hitch, MD       Or   phenol (CHLORASEPTIC) mouth spray 1 spray  1 spray Mouth/Throat PRN Kathryne Hitch, MD       methocarbamol (ROBAXIN) tablet 500 mg  500 mg Oral Q6H PRN Kathryne Hitch, MD       Or   methocarbamol (ROBAXIN) 500 mg in dextrose 5 % 50 mL IVPB  500 mg Intravenous Q6H PRN Kathryne Hitch, MD 110 mL/hr at 12/27/22 1348 500 mg at 12/27/22 1348   metoCLOPramide (REGLAN) tablet 5-10 mg  5-10 mg Oral Q8H PRN Kathryne Hitch, MD       Or   metoCLOPramide (REGLAN) injection 5-10 mg  5-10 mg Intravenous Q8H PRN Kathryne Hitch, MD       morphine (PF) 2 MG/ML injection 0.5-1 mg  0.5-1 mg Intravenous Q2H PRN Kathryne Hitch, MD       morphine (PF) 2 MG/ML injection 2 mg  2 mg Intravenous Q2H PRN Kathryne Hitch, MD   2 mg at 12/28/22 1108   ondansetron (ZOFRAN) tablet 4 mg  4 mg Oral Q6H PRN Kathryne Hitch, MD       Or   ondansetron [facility]) injection 4 mg  4 mg Intravenous Q6H PRN Kathryne Hitch, MD       oxyCODONE (Oxy IR/ROXICODONE) immediate release tablet 5 mg  5 mg Oral Q4H PRN Rolly Salter, MD       pantoprazole (PROTONIX) EC tablet 40 mg  40 mg Oral BID AC Kathryne Hitch, MD   40 mg at 12/28/22 0847   senna-docusate (Senokot-S) tablet 1 tablet  1 tablet Oral BID Rolly Salter, MD   1 tablet at 12/28/22 0847   simvastatin (ZOCOR) tablet 10 mg  10 mg Oral QHS Kathryne Hitch, MD   10 mg at 12/27/22 2227     Discharge Medications: Please see discharge summary for a list of discharge medications.  Relevant Imaging Results:  Relevant Lab Results:   Additional Information SSN: [id number]  Howell Rucks, RN

## 2022-12-28 NOTE — Progress Notes (Signed)
Pharmacy Antibiotic Note  Brianna Bush is a 89 y.o. female admitted on 12/26/2022 with  aspiration pneumonia .  Pharmacy has been consulted for Unasyn dosing.  Plan: - Start IV Unasyn 3g q12hrs - Monitor renal function and overall clinical picture  Height: 5\' 3"  (160 cm) Weight: 58 kg (127 lb 13.9 oz) IBW/kg (Calculated) : 52.4  Temp (24hrs), Avg:98.2 F (36.8 C), Min:97.4 F (36.3 C), Max:98.9 F (37.2 C)  Recent Labs  Lab 12/26/22 1740 12/27/22 1744 12/28/22 1341  WBC 18.8* 16.2* 13.6*  CREATININE 0.63 0.74 1.21*    Estimated Creatinine Clearance: 22.5 mL/min (A) (by C-G formula based on SCr of 1.21 mg/dL (H)).    Allergies  Allergen Reactions   Tape Other (See Comments)    SKIN WILL BRUISE AND TEAR EASILY!!   Atenolol-Chlorthalidone Other (See Comments)    Bradycardia     Antimicrobials this admission: 8/25 cefazolin >> 8/26 8/26 Unasyn >>   Dose adjustments this admission: N/A  Microbiology results: 8/24 MRSA PCR: negative   Thank you for allowing pharmacy to be a part of this patient's care.  Cherylin Mylar, PharmD Clinical Pharmacist  8/26/20244:46 PM

## 2022-12-28 NOTE — TOC Initial Note (Addendum)
Transition of Care [facility]) - Initial/Assessment Note    Patient Details  Name: Brianna Bush MRN: [id number] Date of Birth: April 05, 1924  [facility]) CM/SW Contact:    Howell Rucks, RN Phone Number: 12/28/2022, 12:21 PM  Clinical Narrative:  Met with pt and [name] at bedside to introduce role of TOC/NCM and review for dc planning. Brianna Bush reports pt has a PCP and pharmacy in place, [facility] [employer] comes weekly to assist with Adl's and chores, pt has walker and cane at home she uses as needed. Brianna Bush reports pt resides alone, has total of 7 children who assist with pt's care and are in and out of the home constantly. PT eval pending, await recommendation.   -3:41pm PT eval completed, recommendation for short term rehab-SNF. Met with [name] at bedside, agreeable to short term rehab, no preference. FL2 completed, faxed out for bed offers.                  Expected Discharge Plan: Home/Self Care Barriers to Discharge: Continued Medical Work up   Patient Goals and CMS Choice Patient states their goals for this hospitalization and ongoing recovery are:: return home with support from children          Expected Discharge Plan and Services       Living arrangements for the past 2 months: Single Family Home                                      Prior Living Arrangements/Services Living arrangements for the past 2 months: Single Family Home Lives with:: Self Patient language and need for interpreter reviewed:: Yes Do you feel safe going back to the place where you live?: Yes      Need for Family Participation in Patient Care: Yes (Comment) Care giver support system in place?: Yes (comment) Current home services: DME (cane, walker) Criminal Activity/Legal Involvement Pertinent to Current Situation/Hospitalization: No - Comment as needed  Activities of Daily Living Home Assistive Devices/Equipment: Eyeglasses, Hearing aid ADL Screening  (condition at time of admission) Patient's cognitive ability adequate to safely complete daily activities?: Yes Is the patient deaf or have difficulty hearing?: Yes Does the patient have difficulty seeing, even when wearing glasses/contacts?: No Does the patient have difficulty concentrating, remembering, or making decisions?: No Patient able to express need for assistance with ADLs?: Yes Does the patient have difficulty dressing or bathing?: No Independently performs ADLs?: Yes (appropriate for developmental age) Does the patient have difficulty walking or climbing stairs?: No Weakness of Legs: Left Weakness of Arms/Hands: None  Permission Sought/Granted Permission sought to share information with : Case Manager Permission granted to share information with : Yes, Verbal Permission Granted  Share Information with NAME: Fannie Knee, RN           Emotional Assessment Appearance:: Appears stated age Attitude/Demeanor/Rapport: Gracious Affect (typically observed): Accepting Orientation: : Oriented to Self, Oriented to Place, Oriented to  Time Alcohol / Substance Use: Not Applicable Psych Involvement: No (comment)  Admission diagnosis:  Hip fracture (HCC) [S72.009A] Acute diastolic (congestive) heart failure (HCC) [I50.31] Patient Active Problem List   Diagnosis Date Noted   Closed subcapital fracture of left femur (HCC) 12/27/2022   Acute diastolic (congestive) heart failure (HCC) 12/27/2022   Hip fracture (HCC) 12/26/2022   Closed fracture of neck of left femur (HCC) 12/26/2022   Acute upper GI bleed  04/30/2022   GI bleeding 04/29/2022   Macrocytic anemia 04/29/2022   Colitis 10/11/2015   Hypokalemia 10/11/2015   Stage 3a chronic kidney disease (CKD) (HCC) 10/11/2015   GERD (gastroesophageal reflux disease) 10/11/2015   Leg wound, left 10/11/2015   Hypertension 10/11/2015   Hyperlipidemia 10/11/2015   Endometrial carcinoma (HCC) 10/11/2015   Colitis, acute 10/11/2015    PCP:  Jackelyn Poling, DO Pharmacy:   [facility] [address] [place] - [address] [place] [address] Phone: [phone] Fax: [phone]     Social Determinants of Health (SDOH) Social History: SDOH Screenings   Food Insecurity: No Food Insecurity (12/26/2022)  Housing: Low Risk  (12/26/2022)  Transportation Needs: No Transportation Needs (12/26/2022)  Utilities: Not At Risk (12/26/2022)  Tobacco Use: Low Risk  (12/26/2022)   SDOH Interventions:     Readmission Risk Interventions    12/28/2022   12:18 PM  Readmission Risk Prevention Plan  Post Dischage Appt Complete  Medication Screening Complete  Transportation Screening Complete

## 2022-12-28 NOTE — Discharge Instructions (Signed)

## 2022-12-29 ENCOUNTER — Inpatient Hospital Stay (HOSPITAL_COMMUNITY): Payer: Medicare HMO

## 2022-12-29 DIAGNOSIS — S72002A Fracture of unspecified part of neck of left femur, initial encounter for closed fracture: Secondary | ICD-10-CM | POA: Diagnosis not present

## 2022-12-29 LAB — CBC
HCT: 37.3 % (ref 36.0–46.0)
Hemoglobin: 11.8 g/dL — ABNORMAL LOW (ref 12.0–15.0)
MCH: 31 pg (ref 26.0–34.0)
MCHC: 31.6 g/dL (ref 30.0–36.0)
MCV: 97.9 fL (ref 80.0–100.0)
Platelets: 221 10*3/uL (ref 150–400)
RBC: 3.81 MIL/uL — ABNORMAL LOW (ref 3.87–5.11)
RDW: 14.8 % (ref 11.5–15.5)
WBC: 12.5 10*3/uL — ABNORMAL HIGH (ref 4.0–10.5)
nRBC: 0 % (ref 0.0–0.2)

## 2022-12-29 LAB — BASIC METABOLIC PANEL
Anion gap: 10 (ref 5–15)
BUN: 52 mg/dL — ABNORMAL HIGH (ref 8–23)
CO2: 21 mmol/L — ABNORMAL LOW (ref 22–32)
Calcium: 8.1 mg/dL — ABNORMAL LOW (ref 8.9–10.3)
Chloride: 103 mmol/L (ref 98–111)
Creatinine, Ser: 1.1 mg/dL — ABNORMAL HIGH (ref 0.44–1.00)
GFR, Estimated: 46 mL/min — ABNORMAL LOW (ref >60)
Glucose, Bld: 103 mg/dL — ABNORMAL HIGH (ref 70–99)
Potassium: 3.9 mmol/L (ref 3.5–5.1)
Sodium: 134 mmol/L — ABNORMAL LOW (ref 135–145)

## 2022-12-29 LAB — MAGNESIUM: Magnesium: 2.4 mg/dL (ref 1.7–2.4)

## 2022-12-29 MED ORDER — ASPIRIN 81 MG PO CHEW: 81.0000 mg | CHEWABLE_TABLET | Freq: Two times a day (BID) | ORAL | 0 refills | Status: AC

## 2022-12-29 MED ORDER — HYDROCODONE-ACETAMINOPHEN 5-325 MG PO TABS: 1.0000 | ORAL_TABLET | Freq: Four times a day (QID) | ORAL | 0 refills | Status: DC | PRN

## 2022-12-29 NOTE — Plan of Care (Signed)
  Problem: Education: Goal: Knowledge of General Education information will improve Description: Including pain rating scale, medication(s)/side effects and non-pharmacologic comfort measures Outcome: Progressing   Problem: Clinical Measurements: Goal: Will remain free from infection Outcome: Progressing Goal: Respiratory complications will improve Outcome: Progressing   Problem: Activity: Goal: Risk for activity intolerance will decrease Outcome: Progressing   Problem: Nutrition: Goal: Adequate nutrition will be maintained Outcome: Progressing   Problem: Pain Managment: Goal: General experience of comfort will improve Outcome: Progressing   

## 2022-12-29 NOTE — Progress Notes (Signed)
Patient ID: Brianna Bush, female   DOB: 1923/05/15, 89 y.o.   MRN: [id number] The patient is awake this morning.  There is slight confusion but nursing is with her.  She has mobilized some with therapy but does need assistance.  Although she does have good family support, she did live alone at home before this injury.  It sounds like they are looking at short-term skilled nursing.  From an orthopedic standpoint her left hip incision looks good.  I did change the dressing.  The dressing can stay on until her follow-up and can get wet in the shower.  I will leave instructions within epic.  From a DVT coverage standpoint, we only recommend a baby aspirin twice daily.  I will put prescriptions in the chart for aspirin and pain medication.

## 2022-12-29 NOTE — Progress Notes (Addendum)
Physical Therapy Treatment Patient Details Name: Brianna Bush MRN: [id number] DOB: [birth year] Today's Date: 12/29/2022   History of Present Illness Pt is 89 yo female admitted 12/26/22 after fall with L femoral neck fracture.  He is s/p L hip anterior hemiarthroplasty on 12/27/22.  Pt with hx including GERD, HTN, and HLD.    PT Comments  +2 max assist for supine to sit, pt sat edge of bed ~5 minutes and reported feeling faint, this did not resolve. BP sitting 129/86 sitting, SpO2 88% on 3L O2, HR 84. +2 total assist for sit to supine. Instructed pt/daughter in LLE AAROM exercises. Pt's daughter present and stated pt tends to do better in the afternoons. Daughter also reported pt is having some mild confusion this morning which is not baseline. Pt's bed was noted to be heavily saturated in urine, nursing notified.     If plan is discharge home, recommend the following: A lot of help with bathing/dressing/bathroom;Two people to help with walking and/or transfers;Assistance with cooking/housework;Assist for transportation;Help with stairs or ramp for entrance   Can travel by private vehicle     No  Equipment Recommendations  Other (comment) (TBD at SNF)    Recommendations for Other Services       Precautions / Restrictions Precautions Precautions: Fall Restrictions Weight Bearing Restrictions: No LLE Weight Bearing: Weight bearing as tolerated     Mobility  Bed Mobility Overal bed mobility: Needs Assistance Bed Mobility: Supine to Sit, Sit to Supine     Supine to sit: +2 for physical assistance, Max assist Sit to supine: Total assist, +2 for physical assistance   General bed mobility comments: assist to raise trunk, pt able to advance RLE independently, and assisted with advancing LLE using gait belt as a leg lifter; pt sat EOB ~5 minutes, reported feeling faint which did not resolve, BP sitting 129/86, SpO2 88% on 3L O2, HR 84. Assisted back to supine and placed bed in chair  position    Transfers                   General transfer comment: unable-pt reported feeling faint in sitting    Ambulation/Gait                   Stairs             Wheelchair Mobility     Tilt Bed    Modified Rankin (Stroke Patients Only)       Balance Overall balance assessment: Needs assistance Sitting-balance support: Bilateral upper extremity supported, Feet supported Sitting balance-Leahy Scale: Poor Sitting balance - Comments: needs UE support   Standing balance support: Bilateral upper extremity supported                                Cognition Arousal: Alert Behavior During Therapy: WFL for tasks assessed/performed Overall Cognitive Status: Within Functional Limits for tasks assessed                                          Exercises General Exercises - Lower Extremity Ankle Circles/Pumps: AROM, Both, 10 reps, Supine Heel Slides: AAROM, Left, 10 reps, Supine Hip ABduction/ADduction: AAROM, Left, 10 reps, Supine    General Comments        Pertinent Vitals/Pain Pain Assessment Faces Pain Scale: Hurts even more  Pain Location: L hip with movement Pain Descriptors / Indicators: Discomfort, Grimacing, Guarding Pain Intervention(s): Limited activity within patient's tolerance, Monitored during session, Premedicated before session, Ice applied    Home Living                          Prior Function            PT Goals (current goals can now be found in the care plan section) Acute Rehab PT Goals Patient Stated Goal: return to walking; agreeable to post acute rehab PT Goal Formulation: With patient/family Time For Goal Achievement: 01/11/23 Potential to Achieve Goals: Good Progress towards PT goals: Progressing toward goals    Frequency    Min 1X/week      PT Plan      Co-evaluation              AM-PAC PT "6 Clicks" Mobility   Outcome Measure  Help needed turning  from your back to your side while in a flat bed without using bedrails?: A Lot Help needed moving from lying on your back to sitting on the side of a flat bed without using bedrails?: Total Help needed moving to and from a bed to a chair (including a wheelchair)?: Total Help needed standing up from a chair using your arms (e.g., wheelchair or bedside chair)?: Total Help needed to walk in hospital room?: Total Help needed climbing 3-5 steps with a railing? : Total 6 Click Score: 7    End of Session Equipment Utilized During Treatment: Gait belt Activity Tolerance: Treatment limited secondary to medical complications (Comment) (pt felt faint in sitting) Patient left: in bed;with call bell/phone within reach;with bed alarm set;with family/visitor present Nurse Communication: Mobility status;Other (comment) (bed saturated in urine) PT Visit Diagnosis: Other abnormalities of gait and mobility (R26.89);Muscle weakness (generalized) (M62.81);Pain Pain - Right/Left: Left Pain - part of body: Hip     Time: 0927-0953 PT Time Calculation (min) (ACUTE ONLY): 26 min  Charges:    $Therapeutic Exercise: 8-22 mins $Therapeutic Activity: 8-22 mins PT General Charges $$ ACUTE PT VISIT: 1 Visit                     Tamala Ser PT 12/29/2022  Acute Rehabilitation Services  Office [phone]

## 2022-12-29 NOTE — TOC Progression Note (Signed)
Transition of Care [facility]) - Progression Note    Patient Details  Name: Brianna Bush MRN: [id number] Date of Birth: 1923-08-18  Transition of Care [facility]) CM/SW Contact  Howell Rucks, RN Phone Number: 12/29/2022, 11:26 AM  Clinical Narrative:  Met with pt's dtrs at bedside, given SNF bed offers list, await choice. TOC will continue to follow.     Expected Discharge Plan: Home/Self Care Barriers to Discharge: Continued Medical Work up  Expected Discharge Plan and Services       Living arrangements for the past 2 months: Single Family Home                                       Social Determinants of Health (SDOH) Interventions SDOH Screenings   Food Insecurity: No Food Insecurity (12/26/2022)  Housing: Low Risk  (12/26/2022)  Transportation Needs: No Transportation Needs (12/26/2022)  Utilities: Not At Risk (12/26/2022)  Tobacco Use: Low Risk  (12/26/2022)    Readmission Risk Interventions    12/28/2022   12:18 PM  Readmission Risk Prevention Plan  Post Dischage Appt Complete  Medication Screening Complete  Transportation Screening Complete

## 2022-12-29 NOTE — Progress Notes (Signed)
[facility] Progress Note Patient: Brianna Bush MWU:[id number] DOB: 1923/11/13 DOA: 12/26/2022  DOS: the patient was seen and examined on 12/29/2022  Brief hospital course: PMH of HTN, HLD, GERD, IBS, Gilbert's disease, endometrial cancer presented to the hospital with complaints of mechanical fall.  Found to have left femoral neck fracture. 8/25 underwent left direct anterior hip hemiarthroplasty.  Transferred to progressive care for hypoxia due to acute CHF Assessment and Plan: Left femoral neck fracture. SP hip hemiarthroplasty. Pain management, DVT prophylaxis per orthopedics. Weightbearing per orthopedics. PT OT consulted.  Acute hypoxic respiratory failure Acute suspected diastolic CHF. Right pleural effusion. Possible right-sided pneumonia. Treated with IV Lasix.  BNP elevated at the time of admission.  Serum creatinine trended up after diuresis therefore diuresis is on hold.  Treated with IV albumin. Monitor for now.  Suspected right lower lobe pneumonia. Pro-Cal minimally elevated.  With hypoxia Add antibiotic. Monitor.  Accelerated hypertension Blood pressure was minimally elevated at the time of admission. On amlodipine 5 mg at home which I will continue.  HLD. Continuing statin.   Subjective: Continues to have cough.  Dry.  No nausea no vomiting.  No chest pain.   Physical Exam: General: in Mild distress, No Rash Cardiovascular: S1 and S2 Present, No Murmur Respiratory: Good respiratory effort, Bilateral Air entry present.  Right-sided diminished breath sounds with crackles, No wheezes Abdomen: Bowel Sound present, No tenderness Extremities: No edema Neuro: Alert and oriented x3, no new focal deficit  Data Reviewed: I have Reviewed nursing notes, Vitals, and Lab results. Since last encounter, pertinent lab results CBC and BMP   . I have ordered test including CBC and BMP  . I have ordered imaging chest x-ray  .   Disposition: Status is:  Inpatient Remains inpatient appropriate because: Awaiting improvement in oxygenation  SCDs Start: 12/27/22 1658 SCDs Start: 12/26/22 2228   Family Communication: No one at bedside Level of care: Progressive   Vitals:   12/29/22 0533 12/29/22 0800 12/29/22 0949 12/29/22 1402  BP: (!) 144/89  129/86 124/71  Pulse: 82  84 81  Resp:    16  Temp: 98.1 F (36.7 C)   97.7 F (36.5 C)  TempSrc: Oral   Oral  SpO2: 91% 92% 90% 92%  Weight:      Height:         Author: Lynden Oxford, MD 12/29/2022 7:17 PM  Please look on [email or web address] to find out who is on call.

## 2022-12-30 DIAGNOSIS — J690 Pneumonitis due to inhalation of food and vomit: Secondary | ICD-10-CM | POA: Diagnosis not present

## 2022-12-30 DIAGNOSIS — I5031 Acute diastolic (congestive) heart failure: Secondary | ICD-10-CM

## 2022-12-30 DIAGNOSIS — S72002A Fracture of unspecified part of neck of left femur, initial encounter for closed fracture: Secondary | ICD-10-CM | POA: Diagnosis not present

## 2022-12-30 LAB — CBC
HCT: 39 % (ref 36.0–46.0)
Hemoglobin: 12.3 g/dL (ref 12.0–15.0)
MCH: 30.8 pg (ref 26.0–34.0)
MCHC: 31.5 g/dL (ref 30.0–36.0)
MCV: 97.5 fL (ref 80.0–100.0)
Platelets: 228 10*3/uL (ref 150–400)
RBC: 4 MIL/uL (ref 3.87–5.11)
RDW: 14.9 % (ref 11.5–15.5)
WBC: 9.8 10*3/uL (ref 4.0–10.5)
nRBC: 0 % (ref 0.0–0.2)

## 2022-12-30 LAB — BASIC METABOLIC PANEL
Anion gap: 8 (ref 5–15)
BUN: 49 mg/dL — ABNORMAL HIGH (ref 8–23)
CO2: 22 mmol/L (ref 22–32)
Calcium: 8.1 mg/dL — ABNORMAL LOW (ref 8.9–10.3)
Chloride: 109 mmol/L (ref 98–111)
Creatinine, Ser: 0.87 mg/dL (ref 0.44–1.00)
GFR, Estimated: >60 mL/min (ref >60)
Glucose, Bld: 96 mg/dL (ref 70–99)
Potassium: 3.7 mmol/L (ref 3.5–5.1)
Sodium: 139 mmol/L (ref 135–145)

## 2022-12-30 LAB — MAGNESIUM: Magnesium: 2.5 mg/dL — ABNORMAL HIGH (ref 1.7–2.4)

## 2022-12-30 MED ORDER — AMOXICILLIN-POT CLAVULANATE 875-125 MG PO TABS
1.0000 | ORAL_TABLET | Freq: Two times a day (BID) | ORAL | Status: DC
  Administered 2022-12-30 – 2022-12-31 (×3): 1 via ORAL
  Filled 2022-12-30 (×3): qty 1

## 2022-12-30 NOTE — TOC Progression Note (Signed)
Transition of Care [facility]) - Progression Note    Patient Details  Name: Brianna Bush MRN: [id number] Date of Birth: Sep 14, 1923  Transition of Care [facility]) CM/SW Contact  Howell Rucks, RN Phone Number: 12/30/2022, 4:04 PM  Clinical Narrative: Call received from pt's dtr Lupita Leash) confirmed [facility] for short term rehab SNF. [employer] auth initiated via [employer]. Auth/Cerificate # [id number], days approved: 2022-12-31 - 2023-01-12. Team notified.    Expected Discharge Plan: Home/Self Care Barriers to Discharge: Continued Medical Work up  Expected Discharge Plan and Services       Living arrangements for the past 2 months: Single Family Home                                       Social Determinants of Health (SDOH) Interventions SDOH Screenings   Food Insecurity: No Food Insecurity (12/26/2022)  Housing: Low Risk  (12/26/2022)  Transportation Needs: No Transportation Needs (12/26/2022)  Utilities: Not At Risk (12/26/2022)  Tobacco Use: Low Risk  (12/26/2022)    Readmission Risk Interventions    12/28/2022   12:18 PM  Readmission Risk Prevention Plan  Post Dischage Appt Complete  Medication Screening Complete  Transportation Screening Complete

## 2022-12-30 NOTE — Plan of Care (Signed)
  Problem: Activity: Goal: Risk for activity intolerance will decrease Outcome: Progressing   Problem: Coping: Goal: Level of anxiety will decrease Outcome: Progressing   Problem: Pain Managment: Goal: General experience of comfort will improve Outcome: Progressing   

## 2022-12-30 NOTE — Progress Notes (Signed)
SATURATION QUALIFICATIONS: (This note is used to comply with regulatory documentation for home oxygen)  Patient Saturations on Room Air at Rest = 89%  Patient Saturations on Room Air while Ambulating = 87%  Patient Saturations on 1.5 Liters of oxygen while Ambulating = 90%  Please briefly explain why patient needs home oxygen: Pt O2 drops to 87% when ambulating on room air and requires oxygen when moving and at rest.

## 2022-12-30 NOTE — Progress Notes (Signed)
Physical Therapy Treatment Patient Details Name: Brianna Bush MRN: [id number] DOB: [birth year] Today's Date: 12/30/2022   History of Present Illness Pt is 89 yo female admitted 12/26/22 after fall with L femoral neck fracture.  He is s/p L hip anterior hemiarthroplasty on 12/27/22.  Pt with hx including GERD, HTN, and HLD.    PT Comments  Pt is progressing well with mobility, she ambulated 12' with RW, SpO2 87% on room air, 90% on 1.5L O2 at rest. Pt performed gentle AAROM exercises L hip with assistance. ST-SNF recommended for post acute rehab.     If plan is discharge home, recommend the following: A lot of help with bathing/dressing/bathroom;Assistance with cooking/housework;Assist for transportation;Help with stairs or ramp for entrance;A lot of help with walking and/or transfers   Can travel by private vehicle     No  Equipment Recommendations  None recommended by PT    Recommendations for Other Services       Precautions / Restrictions Precautions Precautions: Fall Restrictions Weight Bearing Restrictions: No LLE Weight Bearing: Weight bearing as tolerated     Mobility  Bed Mobility Overal bed mobility: Needs Assistance Bed Mobility: Supine to Sit     Supine to sit: Mod assist     General bed mobility comments: assist to raise trunk, pt able to advance RLE independently, and assisted with advancing LLE using gait belt as a leg lifter    Transfers Overall transfer level: Needs assistance Equipment used: Rolling walker (2 wheels) Transfers: Sit to/from Stand Sit to Stand: Min assist           General transfer comment: VCs hand placement    Ambulation/Gait Ambulation/Gait assistance: Min assist Gait Distance (Feet): 12 Feet Assistive device: Rolling walker (2 wheels) Gait Pattern/deviations: Decreased stride length, Step-to pattern, Decreased weight shift to left Gait velocity: decreased     General Gait Details: VCs sequencing, distance limited by  pain, no loss of balance   Stairs             Wheelchair Mobility     Tilt Bed    Modified Rankin (Stroke Patients Only)       Balance Overall balance assessment: Needs assistance Sitting-balance support: Feet supported, No upper extremity supported Sitting balance-Leahy Scale: Fair Sitting balance - Comments: needs UE support   Standing balance support: Bilateral upper extremity supported, During functional activity, Reliant on assistive device for balance Standing balance-Leahy Scale: Poor                              Cognition Arousal: Alert Behavior During Therapy: WFL for tasks assessed/performed Overall Cognitive Status: Within Functional Limits for tasks assessed                                          Exercises General Exercises - Lower Extremity Ankle Circles/Pumps: AROM, Both, 10 reps, Supine Heel Slides: AAROM, Left, 10 reps, Supine Hip ABduction/ADduction: AAROM, Left, 10 reps, Supine    General Comments        Pertinent Vitals/Pain Pain Assessment Faces Pain Scale: Hurts even more Pain Location: L hip with movement Pain Descriptors / Indicators: Discomfort, Grimacing, Guarding Pain Intervention(s): Limited activity within patient's tolerance, Monitored during session, Premedicated before session, Ice applied, Repositioned    Home Living  Prior Function            PT Goals (current goals can now be found in the care plan section) Acute Rehab PT Goals Patient Stated Goal: return to walking; agreeable to post acute rehab PT Goal Formulation: With patient/family Time For Goal Achievement: 01/11/23 Potential to Achieve Goals: Good Progress towards PT goals: Progressing toward goals    Frequency    Min 1X/week      PT Plan      Co-evaluation              AM-PAC PT "6 Clicks" Mobility   Outcome Measure  Help needed turning from your back to your side while  in a flat bed without using bedrails?: A Lot Help needed moving from lying on your back to sitting on the side of a flat bed without using bedrails?: A Lot Help needed moving to and from a bed to a chair (including a wheelchair)?: A Little Help needed standing up from a chair using your arms (e.g., wheelchair or bedside chair)?: A Little Help needed to walk in hospital room?: A Little Help needed climbing 3-5 steps with a railing? : Total 6 Click Score: 14    End of Session Equipment Utilized During Treatment: Gait belt Activity Tolerance: Patient tolerated treatment well Patient left: in chair;with call bell/phone within reach;with chair alarm set;with family/visitor present Nurse Communication: Mobility status PT Visit Diagnosis: Other abnormalities of gait and mobility (R26.89);Muscle weakness (generalized) (M62.81);Pain Pain - Right/Left: Left Pain - part of body: Hip     Time: 6301-6010 PT Time Calculation (min) (ACUTE ONLY): 26 min  Charges:    $Gait Training: 8-22 mins $Therapeutic Exercise: 8-22 mins PT General Charges $$ ACUTE PT VISIT: 1 Visit                     Tamala Ser PT 12/30/2022  Acute Rehabilitation Services  Office [phone]

## 2022-12-30 NOTE — Progress Notes (Signed)
PROGRESS NOTE  Brianna Bush UEA:[id number] DOB: [birth year]   PCP: Jackelyn Poling, DO  Patient is from: Home.  DOA: 12/26/2022 LOS: 3  Chief complaints Chief Complaint  Patient presents with   Fall   Hip Pain     Brief Narrative / Interim history: 89 year old F with PMH of HTN, HLD, GERD, IBS, Gilbert's disease, endometrial cancer presented to the hospital with complaints of mechanical fall and left hip pain, and found to have left femoral neck fracture.  She underwent left direct anterior hip arthroplasty by Dr. Magnus Ivan on 8/25, and transfer to progressive care due to hypoxic respiratory failure in the setting of acute diastolic CHF and possible aspiration pneumonia with CXR showing RLL opacity.  Pro-Cal is elevated to 0.7.  She was treated with diuretics and antibiotics.  Respiratory failure improved.  Therapy recommended SNF.  Likely discharge to SNF with or without oxygen on 8/29    Subjective: Seen and examined earlier this morning.  No major events overnight of this morning.  No complaints.  She denies pain, shortness of breath, cough, GI or UTI symptoms.  Patient's 2 daughters at bedside.  Objective: Vitals:   12/29/22 2142 12/30/22 0653 12/30/22 1051 12/30/22 1253  BP: (!) 146/79 (!) 159/83  128/64  Pulse: 79 76  87  Resp: 18 18  16   Temp: 98.4 F (36.9 C) 97.8 F (36.6 C)  (!) 97.5 F (36.4 C)  TempSrc: Oral Oral  Oral  SpO2: 94% 94% 93% 91%  Weight:      Height:        Examination:  GENERAL: No apparent distress.  Nontoxic. HEENT: MMM.  Vision and hearing grossly intact.  NECK: Supple.  No apparent JVD.  RESP:  No IWOB.  Fair aeration bilaterally. CVS:  RRR. Heart sounds normal.  ABD/GI/GU: BS+. Abd soft, NTND.  MSK/EXT:  Moves extremities. No apparent deformity. No edema.  SKIN: no apparent skin lesion or wound NEURO: Awake, alert and oriented appropriately.  No apparent focal neuro deficit. PSYCH: Calm. Normal affect.   Procedures:  8/25-left  direct anterior hip hemiarthroplasty by Dr. Magnus Ivan  Microbiology summarized: MRSA PCR screen negative  Assessment and plan: Principal Problem:   Hip fracture [facility]) Active Problems:   Closed fracture of neck of left femur (HCC)   Closed subcapital fracture of left femur (HCC)   Acute diastolic (congestive) heart failure (HCC)  Mechanical fall at home with subsequent left femoral neck fracture -S/p left direct anterior hip hemiarthroplasty by Dr. Magnus Ivan on 8/25 -Pain management, DVT prophylaxis per orthopedics. -Bowel regimen -Weightbearing per orthopedics. -PT/OT recommended SNF   Acute hypoxic respiratory failure due to acute diastolic CHF and possible aspiration pneumonia: BNP and Pro-Cal elevated.  CXR with increased hazy opacity in both lung bases, right> left.  Treated with IV Lasix until creatinine bumped up.  Also treated with IV Unasyn.  She appears euvolemic on exam.  Currently requiring 1 L. -Wean oxygen as able -Encourage incentive spirometry, OOB -Change Unasyn to Augmentin to reduce salt and fluid load. -Daily ambulatory saturation assessment   RLL pneumonia with concern for aspiration pneumonia -Management as above   Accelerated hypertension: Normotensive. -Continue home amlodipine   HLD. -Continuing statin.   History of endometrial cancer-noted  Body mass index is 22.65 kg/m.           DVT prophylaxis:  SCDs Start: 12/27/22 1658 SCDs Start: 12/26/22 2228 On aspirin 81 mg twice daily for DVT prophylaxis Code Status: DNR/DNI Family Communication: Updated patient's daughters  at bedside Level of care: Med-Surg Status is: Inpatient Remains inpatient appropriate because: Respiratory failure with hypoxia   Final disposition: SNF Consultants:  Orthopedic surgery  35 minutes with more than 50% spent in reviewing records, counseling patient/family and coordinating care.   Sch Meds:  Scheduled Meds:  acetaminophen  1,000 mg Oral Q6H    amLODipine  5 mg Oral QHS   amoxicillin-clavulanate  1 tablet Oral Q12H   aspirin  81 mg Oral BID   benzonatate  100 mg Oral TID   guaiFENesin  600 mg Oral BID   pantoprazole  40 mg Oral BID AC   senna-docusate  1 tablet Oral BID   simvastatin  10 mg Oral QHS   Continuous Infusions:  methocarbamol (ROBAXIN) IV 500 mg (12/27/22 1348)   PRN Meds:.acetaminophen, diphenhydrAMINE, HYDROcodone-acetaminophen, HYDROcodone-acetaminophen, ipratropium-albuterol, ketorolac, menthol-cetylpyridinium **OR** phenol, methocarbamol **OR** methocarbamol (ROBAXIN) IV, metoCLOPramide **OR** metoCLOPramide (REGLAN) injection, morphine injection, morphine injection, ondansetron **OR** ondansetron (ZOFRAN) IV, oxyCODONE  Antimicrobials: Anti-infectives (From admission, onward)    Start     Dose/Rate Route Frequency Ordered Stop   12/30/22 1800  amoxicillin-clavulanate (AUGMENTIN) 875-125 MG per tablet 1 tablet        1 tablet Oral Every 12 hours 12/30/22 0748 01/03/23 2159   12/28/22 1730  Ampicillin-Sulbactam (UNASYN) 3 g in sodium chloride 0.9 % 100 mL IVPB  Status:  Discontinued        3 g 200 mL/hr over 30 Minutes Intravenous Every 12 hours 12/28/22 1643 12/30/22 0748   12/27/22 1745  ceFAZolin (ANCEF) IVPB 1 g/50 mL premix        1 g 100 mL/hr over 30 Minutes Intravenous Every 6 hours 12/27/22 1658 12/28/22 0033   12/27/22 0600  ceFAZolin (ANCEF) IVPB 2g/100 mL premix  Status:  Discontinued        2 g 200 mL/hr over 30 Minutes Intravenous On call to O.R. 12/26/22 1901 12/27/22 1513        I have personally reviewed the following labs and images: CBC: Recent Labs  Lab 12/26/22 1740 12/27/22 1744 12/28/22 1341 12/29/22 0457 12/30/22 0449  WBC 18.8* 16.2* 13.6* 12.5* 9.8  NEUTROABS 16.1* 15.1*  --   --   --   HGB 15.4* 14.4 13.1 11.8* 12.3  HCT 47.7* 45.8 41.9 37.3 39.0  MCV 96.2 100.0 100.2* 97.9 97.5  PLT 330 271 242 221 228   BMP &GFR Recent Labs  Lab 12/26/22 1740 12/27/22 1744  12/28/22 1341 12/29/22 0457 12/30/22 0449  NA 138 137 138 134* 139  K 4.0 4.5 4.0 3.9 3.7  CL 105 104 109 103 109  CO2 23 19* 20* 21* 22  GLUCOSE 106* 122* 156* 103* 96  BUN 31* 27* 50* 52* 49*  CREATININE 0.63 0.74 1.21* 1.10* 0.87  CALCIUM 9.2 8.8* 8.2* 8.1* 8.1*  MG  --  2.1 2.4 2.4 2.5*   Estimated Creatinine Clearance: 31.3 mL/min (by C-G formula based on SCr of 0.87 mg/dL). Liver & Pancreas: Recent Labs  Lab 12/27/22 1744  AST 26  ALT 18  ALKPHOS 55  BILITOT 2.9*  PROT 6.1*  ALBUMIN 2.9*   No results for input(s): "LIPASE", "AMYLASE" in the last 168 hours. No results for input(s): "AMMONIA" in the last 168 hours. Diabetic: No results for input(s): "HGBA1C" in the last 72 hours. No results for input(s): "GLUCAP" in the last 168 hours. Cardiac Enzymes: No results for input(s): "CKTOTAL", "CKMB", "CKMBINDEX", "TROPONINI" in the last 168 hours. No results for input(s): "PROBNP"  in the last 8760 hours. Coagulation Profile: No results for input(s): "INR", "PROTIME" in the last 168 hours. Thyroid Function Tests: No results for input(s): "TSH", "T4TOTAL", "FREET4", "T3FREE", "THYROIDAB" in the last 72 hours. Lipid Profile: No results for input(s): "CHOL", "HDL", "LDLCALC", "TRIG", "CHOLHDL", "LDLDIRECT" in the last 72 hours. Anemia Panel: No results for input(s): "VITAMINB12", "FOLATE", "FERRITIN", "TIBC", "IRON", "RETICCTPCT" in the last 72 hours. Urine analysis:    Component Value Date/Time   COLORURINE YELLOW 12/26/2022 1812   APPEARANCEUR CLEAR 12/26/2022 1812   LABSPEC 1.020 12/26/2022 1812   PHURINE 5.0 12/26/2022 1812   GLUCOSEU NEGATIVE 12/26/2022 1812   HGBUR NEGATIVE 12/26/2022 1812   BILIRUBINUR NEGATIVE 12/26/2022 1812   KETONESUR 5 (A) 12/26/2022 1812   PROTEINUR 100 (A) 12/26/2022 1812   NITRITE NEGATIVE 12/26/2022 1812   LEUKOCYTESUR NEGATIVE 12/26/2022 1812   Sepsis Labs: Invalid input(s): "PROCALCITONIN", "LACTICIDVEN"  Microbiology: Recent  Results (from the past 240 hour(s))  Surgical pcr screen     Status: None   Collection Time: 12/26/22  8:59 PM   Specimen: Nasal Mucosa; Nasal Swab  Result Value Ref Range Status   MRSA, PCR NEGATIVE NEGATIVE Final   Staphylococcus aureus NEGATIVE NEGATIVE Final    Comment: (NOTE) The Xpert SA Assay (FDA approved for NASAL specimens in patients 66 years of age and older), is one component of a comprehensive surveillance program. It is not intended to diagnose infection nor to guide or monitor treatment. Performed at [facility], [address], [place], [place] [address]     Radiology Studies: No results found.    Diarra Ceja T. Cary Wilford [facility]  If 7PM-7AM, please contact night-coverage [email or web address] 12/30/2022, 2:44 PM

## 2022-12-31 DIAGNOSIS — N1831 Chronic kidney disease, stage 3a: Secondary | ICD-10-CM | POA: Diagnosis not present

## 2022-12-31 DIAGNOSIS — I130 Hypertensive heart and chronic kidney disease with heart failure and stage 1 through stage 4 chronic kidney disease, or unspecified chronic kidney disease: Secondary | ICD-10-CM | POA: Diagnosis not present

## 2022-12-31 DIAGNOSIS — J9601 Acute respiratory failure with hypoxia: Secondary | ICD-10-CM | POA: Diagnosis not present

## 2022-12-31 DIAGNOSIS — Z96642 Presence of left artificial hip joint: Secondary | ICD-10-CM | POA: Diagnosis not present

## 2022-12-31 DIAGNOSIS — Z743 Need for continuous supervision: Secondary | ICD-10-CM | POA: Diagnosis not present

## 2022-12-31 DIAGNOSIS — K219 Gastro-esophageal reflux disease without esophagitis: Secondary | ICD-10-CM | POA: Diagnosis not present

## 2022-12-31 DIAGNOSIS — I10 Essential (primary) hypertension: Secondary | ICD-10-CM | POA: Diagnosis not present

## 2022-12-31 DIAGNOSIS — I5031 Acute diastolic (congestive) heart failure: Secondary | ICD-10-CM | POA: Diagnosis not present

## 2022-12-31 DIAGNOSIS — J690 Pneumonitis due to inhalation of food and vomit: Secondary | ICD-10-CM | POA: Diagnosis not present

## 2022-12-31 DIAGNOSIS — S72012A Unspecified intracapsular fracture of left femur, initial encounter for closed fracture: Principal | ICD-10-CM

## 2022-12-31 DIAGNOSIS — D539 Nutritional anemia, unspecified: Secondary | ICD-10-CM | POA: Diagnosis not present

## 2022-12-31 DIAGNOSIS — S72002A Fracture of unspecified part of neck of left femur, initial encounter for closed fracture: Secondary | ICD-10-CM | POA: Diagnosis not present

## 2022-12-31 DIAGNOSIS — S72012D Unspecified intracapsular fracture of left femur, subsequent encounter for closed fracture with routine healing: Secondary | ICD-10-CM | POA: Diagnosis not present

## 2022-12-31 DIAGNOSIS — W19XXXD Unspecified fall, subsequent encounter: Secondary | ICD-10-CM | POA: Diagnosis not present

## 2022-12-31 MED ORDER — SENNOSIDES-DOCUSATE SODIUM 8.6-50 MG PO TABS: 1.0000 | ORAL_TABLET | Freq: Two times a day (BID) | ORAL | Status: AC | PRN

## 2022-12-31 MED ORDER — AMOXICILLIN-POT CLAVULANATE 875-125 MG PO TABS: 1.0000 | ORAL_TABLET | Freq: Two times a day (BID) | ORAL | Status: AC

## 2022-12-31 MED ORDER — GUAIFENESIN ER 600 MG PO TB12: 600.0000 mg | ORAL_TABLET | Freq: Two times a day (BID) | ORAL | Status: AC

## 2022-12-31 NOTE — Progress Notes (Signed)
SATURATION QUALIFICATIONS: (This note is used to comply with regulatory documentation for home oxygen)  Patient Saturations on Room Air at Rest = 93%  Patient Saturations on Room Air while Ambulating = 81%  Patient Saturations on -- Liters of oxygen while Ambulating = --% TBD  Please briefly explain why patient needs home oxygen: to maintain appropriate SpO2 levels.   Ralene Bathe Kistler PT 12/31/2022  Acute Rehabilitation Services  Office [phone]

## 2022-12-31 NOTE — TOC Progression Note (Addendum)
Transition of Care [facility]) - Progression Note    Patient Details  Name: Brianna Bush MRN: [id number] Date of Birth: October 02, 1923  Transition of Care [facility]) CM/SW Contact  Howell Rucks, RN Phone Number: 12/31/2022, 10:12 AM  Clinical Narrative:  Met with pt's dtr Lupita Leash) at bedside to inform of dc today, Lupita Leash reports friends of the family have requested NCM to reach out to [facility] for bed offer for short term rehab. NCM contacted [facility], rep- [place], Whitney to review, await decision.   -11:07am Text received from [place] with [facility], bed offer extended with bed availability today, pt's dtr Lupita Leash notified, states she will speak with family, await facility choice.   -12:51pm Family requested change to [facility], Whitney at [facility] notified. NCM called to [facility], sw Kim, facility changed to [facility], same auth /certificate number - [id number] , days approved 8/29 to 01/12/23, Whitney at [facility] notified, await RM#. Family updated at bedside.    Expected Discharge Plan: Home/Self Care Barriers to Discharge: Barriers Resolved  Expected Discharge Plan and Services       Living arrangements for the past 2 months: Single Family Home Expected Discharge Date: 12/31/22                                     Social Determinants of Health (SDOH) Interventions SDOH Screenings   Food Insecurity: No Food Insecurity (12/26/2022)  Housing: Low Risk  (12/26/2022)  Transportation Needs: No Transportation Needs (12/26/2022)  Utilities: Not At Risk (12/26/2022)  Tobacco Use: Low Risk  (12/26/2022)    Readmission Risk Interventions    12/31/2022    9:31 AM 12/28/2022   12:18 PM  Readmission Risk Prevention Plan  Post Dischage Appt  Complete  Medication Screening  Complete  Transportation Screening Complete Complete  PCP or Specialist Appt within 5-7 Days Complete   Home Care Screening Complete   Medication Review (RN CM) Complete

## 2022-12-31 NOTE — Progress Notes (Signed)
Patient ID: Brianna Bush, female   DOB: 10/27/1923, 89 y.o.   MRN: [id number] The patient was resting this morning but easily arousable.  She does follow commands appropriately.  We did change her left hip dressing even though it was clean and dry just to make sure the incision looks good.  There is no issues with the incision and the staples are intact.  There is no redness or drainage.  We did place a new Aquacel dressing.  The dressing can stay on until she has follow-up with [place] the week after next.  She can go to short-term skilled nursing from an orthopedic standpoint.

## 2022-12-31 NOTE — Progress Notes (Signed)
Report called @[phone]  to [phone]. Spoke with [facility]. RN aware that pt is 2nd in line for [facility] transportation. [place]

## 2022-12-31 NOTE — Progress Notes (Signed)
Physical Therapy Treatment Patient Details Name: Brianna Bush MRN: [id number] DOB: [birth year] Today's Date: 12/31/2022   History of Present Illness Pt is 89 yo female admitted 12/26/22 after fall with L femoral neck fracture.  He is s/p L hip anterior hemiarthroplasty on 12/27/22.  Pt with hx including GERD, HTN, and HLD.    PT Comments  Mod assist for supine to sit. Min assist sit to stand and to ambulate 56' with RW, distance limited by L hip pain and fatigue. SpO2 81% on room air with activity, 93% on room air at rest. Pt performed LLE exercises with min assist. Pt puts forth good effort.      If plan is discharge home, recommend the following: A lot of help with bathing/dressing/bathroom;Assistance with cooking/housework;Assist for transportation;Help with stairs or ramp for entrance;A lot of help with walking and/or transfers   Can travel by private vehicle     No  Equipment Recommendations  None recommended by PT    Recommendations for Other Services       Precautions / Restrictions Precautions Precautions: Fall Precaution Comments: monitor O2 Restrictions Weight Bearing Restrictions: No LLE Weight Bearing: Weight bearing as tolerated     Mobility  Bed Mobility Overal bed mobility: Needs Assistance Bed Mobility: Supine to Sit     Supine to sit: Mod assist     General bed mobility comments: Mod assist to raise trunk and pivot hips to EOB, pt able to advance RLE independently, and assisted with advancing LLE using gait belt as a leg lifter    Transfers Overall transfer level: Needs assistance Equipment used: Rolling walker (2 wheels) Transfers: Sit to/from Stand Sit to Stand: Min assist           General transfer comment: VCs hand placement, min A to power up; SpO2 81% on room air with activity, 93% on room air at rest    Ambulation/Gait Ambulation/Gait assistance: Min assist Gait Distance (Feet): 13 Feet Assistive device: Rolling walker (2  wheels) Gait Pattern/deviations: Decreased stride length, Step-to pattern, Decreased weight shift to left Gait velocity: decreased     General Gait Details: VCs sequencing, distance limited by pain, no loss of balance   Stairs             Wheelchair Mobility     Tilt Bed    Modified Rankin (Stroke Patients Only)       Balance Overall balance assessment: Needs assistance Sitting-balance support: Feet supported, No upper extremity supported Sitting balance-Leahy Scale: Fair Sitting balance - Comments: needs UE support   Standing balance support: Bilateral upper extremity supported, During functional activity, Reliant on assistive device for balance Standing balance-Leahy Scale: Poor                              Cognition Arousal: Alert Behavior During Therapy: WFL for tasks assessed/performed Overall Cognitive Status: Within Functional Limits for tasks assessed                                          Exercises General Exercises - Lower Extremity Ankle Circles/Pumps: AROM, Both, 10 reps, Supine Quad Sets: AROM, Left, 5 reps, Supine Long Arc Quad: AROM, Left, 5 reps, Seated Heel Slides: AAROM, Left, 10 reps, Supine Hip ABduction/ADduction: AAROM, Left, 10 reps, Supine    General Comments  Pertinent Vitals/Pain Pain Assessment Faces Pain Scale: Hurts whole lot Pain Location: L hip with movement Pain Descriptors / Indicators: Discomfort, Grimacing, Guarding Pain Intervention(s): Limited activity within patient's tolerance, Monitored during session, Premedicated before session, Repositioned    Home Living                          Prior Function            PT Goals (current goals can now be found in the care plan section) Acute Rehab PT Goals Patient Stated Goal: return to walking; agreeable to post acute rehab PT Goal Formulation: With patient/family Time For Goal Achievement: 01/11/23 Potential to  Achieve Goals: Good Progress towards PT goals: Progressing toward goals    Frequency    Min 1X/week      PT Plan      Co-evaluation              AM-PAC PT "6 Clicks" Mobility   Outcome Measure  Help needed turning from your back to your side while in a flat bed without using bedrails?: A Little Help needed moving from lying on your back to sitting on the side of a flat bed without using bedrails?: A Lot Help needed moving to and from a bed to a chair (including a wheelchair)?: A Little Help needed standing up from a chair using your arms (e.g., wheelchair or bedside chair)?: A Little Help needed to walk in hospital room?: A Little Help needed climbing 3-5 steps with a railing? : Total 6 Click Score: 15    End of Session Equipment Utilized During Treatment: Gait belt;Oxygen Activity Tolerance: Patient tolerated treatment well;Patient limited by pain Patient left: in chair;with call bell/phone within reach;with chair alarm set;with family/visitor present Nurse Communication: Mobility status PT Visit Diagnosis: Other abnormalities of gait and mobility (R26.89);Muscle weakness (generalized) (M62.81);Pain Pain - Right/Left: Left Pain - part of body: Hip     Time: 1610-9604 PT Time Calculation (min) (ACUTE ONLY): 23 min  Charges:    $Gait Training: 8-22 mins $Therapeutic Exercise: 8-22 mins PT General Charges $$ ACUTE PT VISIT: 1 Visit                     Tamala Ser PT 12/31/2022  Acute Rehabilitation Services  Office [phone]

## 2022-12-31 NOTE — Plan of Care (Signed)
  Problem: Clinical Measurements: Goal: Will remain free from infection Outcome: Progressing Goal: Diagnostic test results will improve Outcome: Progressing Goal: Respiratory complications will improve Outcome: Progressing   Problem: Activity: Goal: Risk for activity intolerance will decrease Outcome: Progressing   

## 2022-12-31 NOTE — Discharge Summary (Signed)
Physician Discharge Summary  Brianna Bush ZOX:[id number] DOB: [birth year] DOA: 12/26/2022  PCP: Jackelyn Poling, DO  Admit date: 12/26/2022 Discharge date: 12/31/2022 Admitted From: Home Disposition:  SNF Recommendations for Outpatient Follow-up:  Outpatient follow-up with orthopedic surgery as below Check CMP and CBC in 1 week Discharge on 2 L by nasal cannula.  Wean of oxygen as able when mobility improves. Please follow up on the following pending results: None  Discharge Condition: Stable CODE STATUS: DNR/DNI  Follow-up Information     Kathryne Hitch, MD. Schedule an appointment as soon as possible for a visit in 2 week(s).   Specialty: Orthopedic Surgery Contact information: [address] [place] [address] [phone]                 Hospital course 89 year old F with PMH of HTN, HLD, GERD, IBS, Gilbert's disease, endometrial cancer presented to the hospital with complaints of mechanical fall and left hip pain, and found to have left femoral neck fracture.  She underwent left direct anterior hip arthroplasty by Dr. Magnus Ivan on 8/25, and transfer to progressive care due to hypoxic respiratory failure in the setting of acute diastolic CHF and possible aspiration pneumonia with CXR showing RLL opacity.  BNP in 170s.  Pro-Cal is elevated to 0.7.  She was treated with diuretics and antibiotics.  Respiratory failure improved but required 2 L by nasal cannula to maintain appropriate saturation with ambulation.  Therapy recommended SNF.  Outpatient follow-up with orthopedic surgery as above.  See individual problem list below for more.   Problems addressed during this hospitalization Principal Problem:   Hip fracture (HCC) Active Problems:   Closed fracture of neck of left femur (HCC)   Closed subcapital fracture of left femur (HCC)   Acute diastolic (congestive) heart failure (HCC)   Mechanical fall at home with subsequent left femoral neck  fracture -S/p left direct anterior hip hemiarthroplasty by Dr. Magnus Ivan on 8/25 -Norco and Tylenol for pain control and aspirin 81 mg twice daily for VTE prophylaxis -Bowel regimen -Weightbearing as tolerated -Continue PT/OT at SNF   Acute hypoxic respiratory failure due to aspiration pneumonia and possible acute diastolic CHF: BNP in 170s.  Pro-Cal elevated.  CXR with increased hazy opacity in both lung bases, right> left and small bilateral pleural effusion.  TTE with LVEF of 60 to 65%, G1 DD and RVSP of 44 mmHg.  Started on IV Lasix and IV Unasyn.  Lasix discontinued due to creatinine bump.  She appears euvolemic.  -Treat aspiration pneumonia as below -Hold off further diuretics due to risk of AKI. -Discharge on 2 L by nasal cannula.   RLL pneumonia with concern for aspiration pneumonia -IV Unasyn 8/26-8/28>> p.o. Augmentin 8/28 through 8/31.   Accelerated hypertension: Normotensive. -Continue home amlodipine   Hyperlipidemia -Continuing statin.    History of endometrial cancer-noted           Time spent 35 minutes  Vital signs Vitals:   12/30/22 1253 12/30/22 1845 12/30/22 2143 12/31/22 0636  BP: 128/64  (!) 151/91 (!) 148/87  Pulse: 87  78 78  Temp: (!) 97.5 F (36.4 C)  98.1 F (36.7 C) 97.8 F (36.6 C)  Resp: 16  19 17   Height:      Weight:      SpO2: 91% 93% 93% 94%  TempSrc: Oral  Oral Oral  BMI (Calculated):         Discharge exam  GENERAL: No apparent distress.  Nontoxic. HEENT: MMM.  Vision  and hearing grossly intact.  NECK: Supple.  No apparent JVD.  RESP:  No IWOB.  Fair aeration bilaterally. CVS:  RRR. Heart sounds normal.  ABD/GI/GU: BS+. Abd soft, NTND.  MSK/EXT:  Moves extremities. No apparent deformity. No edema.  SKIN: Dressing over left thigh DCI. NEURO: Awake and alert. Oriented appropriately.  No apparent focal neuro deficit. PSYCH: Calm. Normal affect.   Discharge Instructions Discharge Instructions     Diet - low sodium heart  healthy   Complete by: As directed    Increase activity slowly   Complete by: As directed       Allergies as of 12/31/2022       Reactions   Tape Other (See Comments)   SKIN WILL BRUISE AND TEAR EASILY!!   Atenolol-chlorthalidone Other (See Comments)   Bradycardia        Medication List     TAKE these medications    amLODipine 5 MG tablet Commonly known as: NORVASC Take 5 mg by mouth daily.   amoxicillin-clavulanate 875-125 MG tablet Commonly known as: AUGMENTIN Take 1 tablet by mouth every 12 (twelve) hours for 3 days.   aspirin 81 MG chewable tablet Chew 1 tablet (81 mg total) by mouth 2 (two) times daily.   cyanocobalamin 1000 MCG tablet Take 1 tablet (1,000 mcg total) by mouth daily.   ferrous sulfate 325 (65 FE) MG tablet Take 1 tablet (325 mg total) by mouth daily.   guaiFENesin 600 MG 12 hr tablet Commonly known as: MUCINEX Take 1 tablet (600 mg total) by mouth 2 (two) times daily for 5 days.   HYDROcodone-acetaminophen 5-325 MG tablet Commonly known as: NORCO/VICODIN Take 1 tablet by mouth every 6 (six) hours as needed for moderate pain (pain score 4-6).   pantoprazole 40 MG tablet Commonly known as: Protonix Take 1 tablet (40 mg total) by mouth 2 (two) times daily before a meal.   PreserVision AREDS 2 Caps Take 1 capsule by mouth 2 (two) times daily with breakfast and lunch.   senna-docusate 8.6-50 MG tablet Commonly known as: Senokot-S Take 1 tablet by mouth 2 (two) times daily between meals as needed for mild constipation.   simvastatin 10 MG tablet Commonly known as: ZOCOR Take 10 mg by mouth at bedtime.   Tylenol 8 Hour Arthritis Pain 650 MG CR tablet Generic drug: acetaminophen Take 650 mg by mouth in the morning and at bedtime.               Durable Medical Equipment  (From admission, onward)           Start     Ordered   12/27/22 1658  DME 3 n 1  Once        12/27/22 1658   12/27/22 1658  DME Walker rolling  Once        Question Answer Comment  Walker: With 5 Inch Wheels   Patient needs a walker to treat with the following condition Status post left hip replacement      12/27/22 1658            Consultations: Orthopedic surgery  Procedures/Studies: 8/25-left direct anterior hip hemiarthroplasty by Dr. Delphina Cahill Chest 2 View  Result Date: 12/29/2022 CLINICAL DATA:  Right pleural effusion. EXAM: CHEST - 2 VIEW COMPARISON:  Chest x-ray from yesterday. FINDINGS: Stable cardiomediastinal silhouette with mild cardiomegaly. Unchanged small bilateral pleural effusions with associated bibasilar opacities. No pneumothorax. No acute osseous abnormality. IMPRESSION: 1. Unchanged small bilateral pleural  effusions with associated bibasilar opacities, likely atelectasis. Electronically Signed   By: Obie Dredge M.D.   On: 12/29/2022 17:10   ECHOCARDIOGRAM COMPLETE  Result Date: 12/28/2022    ECHOCARDIOGRAM REPORT   Patient Name:   HAWRAA KOBZA [facility] Date of Exam: 12/28/2022 Medical Rec #:  [id number]          Height:       63.0 in Accession #:    [id number]         Weight:       127.9 lb Date of Birth:  05-24-1923          BSA:          1.599 m Patient Age:    89 years           BP:           134/84 mmHg Patient Gender: F                  HR:           80 bpm. Exam Location:  Inpatient Procedure: 2D Echo, Color Doppler and Cardiac Doppler Indications:    CHF  History:        Patient has no prior history of Echocardiogram examinations.                 Risk Factors:Hypertension.  Sonographer:    Darlys Gales Referring Phys: [id number] PRANAV M PATEL IMPRESSIONS  1. Left ventricular ejection fraction, by estimation, is 60 to 65%. The left ventricle has normal function. The left ventricle has no regional wall motion abnormalities. There is moderate concentric left ventricular hypertrophy. Left ventricular diastolic parameters are consistent with Grade I diastolic dysfunction (impaired relaxation).  2. Right ventricular  systolic function is moderately reduced. The right ventricular size is moderately enlarged. There is mildly elevated pulmonary artery systolic pressure. The estimated right ventricular systolic pressure is 44.0 mmHg.  3. The mitral valve is normal in structure. No evidence of mitral valve regurgitation. No evidence of mitral stenosis.  4. The aortic valve is normal in structure. Aortic valve regurgitation is not visualized. No aortic stenosis is present.  5. The inferior vena cava is dilated in size with >50% respiratory variability, suggesting right atrial pressure of 8 mmHg. FINDINGS  Left Ventricle: Left ventricular ejection fraction, by estimation, is 60 to 65%. The left ventricle has normal function. The left ventricle has no regional wall motion abnormalities. The left ventricular internal cavity size was normal in size. There is  moderate concentric left ventricular hypertrophy. Left ventricular diastolic parameters are consistent with Grade I diastolic dysfunction (impaired relaxation). Normal left ventricular filling pressure. Right Ventricle: The right ventricular size is moderately enlarged. No increase in right ventricular wall thickness. Right ventricular systolic function is moderately reduced. There is mildly elevated pulmonary artery systolic pressure. The tricuspid regurgitant velocity is 3.00 m/s, and with an assumed right atrial pressure of 8 mmHg, the estimated right ventricular systolic pressure is 44.0 mmHg. Left Atrium: Left atrial size was normal in size. Right Atrium: Right atrial size was normal in size. Pericardium: There is no evidence of pericardial effusion. Mitral Valve: The mitral valve is normal in structure. Mild to moderate mitral annular calcification. No evidence of mitral valve regurgitation. No evidence of mitral valve stenosis. Tricuspid Valve: The tricuspid valve is normal in structure. Tricuspid valve regurgitation is trivial. No evidence of tricuspid stenosis. Aortic Valve:  The aortic valve is normal in structure. Aortic valve regurgitation is not visualized.  No aortic stenosis is present. Aortic valve mean gradient measures 4.0 mmHg. Aortic valve peak gradient measures 6.6 mmHg. Aortic valve area, by VTI measures 2.51 cm. Pulmonic Valve: The pulmonic valve was normal in structure. Pulmonic valve regurgitation is not visualized. No evidence of pulmonic stenosis. Aorta: The aortic root is normal in size and structure. Venous: The inferior vena cava is dilated in size with greater than 50% respiratory variability, suggesting right atrial pressure of 8 mmHg. IAS/Shunts: No atrial level shunt detected by color flow Doppler.  LEFT VENTRICLE PLAX 2D LVIDd:         3.10 cm   Diastology LVIDs:         2.10 cm   LV e' medial:    6.74 cm/s LV PW:         1.20 cm   LV E/e' medial:  6.6 LV IVS:        1.30 cm   LV e' lateral:   8.27 cm/s LVOT diam:     1.80 cm   LV E/e' lateral: 5.4 LV SV:         58 LV SV Index:   36 LVOT Area:     2.54 cm  RIGHT VENTRICLE RV S prime:     12.10 cm/s TAPSE (M-mode): 1.5 cm LEFT ATRIUM           Index        RIGHT ATRIUM           Index LA Vol (A2C): 13.6 ml 8.51 ml/m   RA Area:     10.70 cm LA Vol (A4C): 32.4 ml 20.26 ml/m  RA Volume:   23.10 ml  14.45 ml/m  AORTIC VALVE AV Area (Vmax):    2.27 cm AV Area (Vmean):   2.67 cm AV Area (VTI):     2.51 cm AV Vmax:           128.00 cm/s AV Vmean:          93.700 cm/s AV VTI:            0.230 m AV Peak Grad:      6.6 mmHg AV Mean Grad:      4.0 mmHg LVOT Vmax:         114.00 cm/s LVOT Vmean:        98.200 cm/s LVOT VTI:          0.227 m LVOT/AV VTI ratio: 0.99  AORTA Ao Root diam: 3.00 cm Ao Asc diam:  2.90 cm MITRAL VALVE               TRICUSPID VALVE MV Area (PHT): 2.56 cm    TR Peak grad:   36.0 mmHg MV Decel Time: 296 msec    TR Vmax:        300.00 cm/s MV E velocity: 44.60 cm/s MV A velocity: 86.10 cm/s  SHUNTS MV E/A ratio:  0.52        Systemic VTI:  0.23 m                            Systemic Diam: 1.80  cm Armanda Magic MD Electronically signed by Armanda Magic MD Signature Date/Time: 12/28/2022/2:28:02 PM    Final    DG CHEST PORT 1 VIEW  Result Date: 12/28/2022 CLINICAL DATA:  Right pleural effusion EXAM: PORTABLE CHEST 1 VIEW COMPARISON:  12/27/2022 FINDINGS: Small bilateral pleural effusions. Associated bilateral lower lobe opacities, likely atelectasis,  left lower lobe pneumonia not excluded. No frank interstitial edema. No pneumothorax. The heart is top-normal in size.  Thoracic aortic atherosclerosis. Moderate degenerative changes of the right shoulder. IMPRESSION: Small bilateral pleural effusions. Associated bilateral lower lobe opacities, likely atelectasis, left lower lobe pneumonia not excluded. Electronically Signed   By: Charline Bills M.D.   On: 12/28/2022 12:25   DG Pelvis Portable  Result Date: 12/27/2022 CLINICAL DATA:  Status post left hip arthroplasty. EXAM: PORTABLE PELVIS 1-2 VIEWS COMPARISON:  Preoperative radiograph yesterday FINDINGS: Left hip arthroplasty in expected alignment. No periprosthetic lucency or fracture. Recent postsurgical change includes air and edema in the soft tissues. Lateral skin staples in place. IMPRESSION: Left hip arthroplasty without immediate postoperative complication. Electronically Signed   By: Narda Rutherford M.D.   On: 12/27/2022 18:03   DG CHEST PORT 1 VIEW  Result Date: 12/27/2022 CLINICAL DATA:  Shortness of breath. Hypoxia after hip arthroplasty. EXAM: PORTABLE CHEST 1 VIEW COMPARISON:  Chest radiograph yesterday FINDINGS: Increasing hazy opacity in both lung bases, right greater than left, likely combination of pleural effusions and airspace disease/atelectasis. Stable heart size. No pulmonary edema. No pneumothorax. Advanced right shoulder arthropathy. IMPRESSION: Increasing hazy opacity in both lung bases, right greater than left, likely combination of pleural effusions and airspace disease/atelectasis. Electronically Signed   By: Narda Rutherford M.D.   On: 12/27/2022 18:01   DG HIP UNILAT WITH PELVIS 1V LEFT  Result Date: 12/27/2022 CLINICAL DATA:  Left hip fracture, left hip hemiarthroplasty placement, intraoperative EXAM: DG HIP (WITH OR WITHOUT PELVIS) 1V*L* COMPARISON:  Radiographs 12/26/2022 FINDINGS: Intraoperative spot images demonstrate a left hip hemiarthroplasty in place without periprosthetic fracture or acute complicating feature. Moderate to prominent degenerative chondral thinning in the contralateral (right) hip. IMPRESSION: 1. Left hip hemiarthroplasty in place without complicating feature. 2. Moderate to prominent degenerative chondral thinning in the right hip. Electronically Signed   By: Gaylyn Rong M.D.   On: 12/27/2022 14:07   DG C-Arm 1-60 Min-No Report  Result Date: 12/27/2022 Fluoroscopy was utilized by the requesting physician.  No radiographic interpretation.   DG Knee Left Port  Result Date: 12/26/2022 CLINICAL DATA:  Femoral neck fracture.  Slipped and fall. EXAM: PORTABLE LEFT KNEE - 2 VIEW COMPARISON:  None Available. FINDINGS: Total knee arthroplasty identified with cemented femoral component. Patellar button. Press-Fit tibial component. Osteopenia. No obvious fracture or dislocation. No obvious joint effusion but the lateral view is limited by rotation. Vascular calcifications. IMPRESSION: Total knee arthroplasty.  Osteopenia.  Limited views Electronically Signed   By: Karen Kays M.D.   On: 12/26/2022 19:13   DG Chest Portable 1 View  Result Date: 12/26/2022 CLINICAL DATA:  Fall, left hip pain. Chest congestion for several days. EXAM: PORTABLE CHEST 1 VIEW COMPARISON:  10/01/2006 FINDINGS: Heart is borderline in size. Mild vascular congestion. No confluent airspace opacities, effusions or edema. No acute bony abnormality. Degenerative changes in the shoulders bilaterally, right greater than left. Aortic atherosclerosis. IMPRESSION: Mild cardiomegaly with vascular congestion. Electronically  Signed   By: Charlett Nose M.D.   On: 12/26/2022 17:12   DG Hip Unilat With Pelvis 2-3 Views Left  Result Date: 12/26/2022 CLINICAL DATA:  Fall, left hip pain EXAM: DG HIP (WITH OR WITHOUT PELVIS) 2-3V LEFT COMPARISON:  None Available. FINDINGS: There is a left femoral neck fracture. Mild varus angulation. No subluxation or dislocation. Degenerative changes in the hips bilaterally with joint space narrowing and spurring. SI joints are symmetric. Scoliosis and degenerative changes in  the lumbar spine. Aortic atherosclerosis. No visible aneurysm. IMPRESSION: Left femoral neck fracture with varus angulation. Electronically Signed   By: Charlett Nose M.D.   On: 12/26/2022 17:10   CT Head Wo Contrast  Result Date: 12/26/2022 CLINICAL DATA:  Head trauma, minor (Age >= 65y).  Fall EXAM: CT HEAD WITHOUT CONTRAST TECHNIQUE: Contiguous axial images were obtained from the base of the skull through the vertex without intravenous contrast. RADIATION DOSE REDUCTION: This exam was performed according to the departmental dose-optimization program which includes automated exposure control, adjustment of the mA and/or kV according to patient size and/or use of iterative reconstruction technique. COMPARISON:  None Available. FINDINGS: Brain: Mild age related volume loss. No acute intracranial abnormality. Specifically, no hemorrhage, hydrocephalus, mass lesion, acute infarction, or significant intracranial injury. Vascular: No hyperdense vessel or unexpected calcification. Skull: No acute calvarial abnormality. Sinuses/Orbits: Air-fluid levels in the maxillary sinuses bilaterally and left sphenoid sinus. Orbital soft tissues unremarkable. Mastoid air cells clear. Other: None IMPRESSION: No acute intracranial abnormality. Probable mild acute sinusitis changes Electronically Signed   By: Charlett Nose M.D.   On: 12/26/2022 17:10       The results of significant diagnostics from this hospitalization (including imaging,  microbiology, ancillary and laboratory) are listed below for reference.     Microbiology: Recent Results (from the past 240 hour(s))  Surgical pcr screen     Status: None   Collection Time: 12/26/22  8:59 PM   Specimen: Nasal Mucosa; Nasal Swab  Result Value Ref Range Status   MRSA, PCR NEGATIVE NEGATIVE Final   Staphylococcus aureus NEGATIVE NEGATIVE Final    Comment: (NOTE) The Xpert SA Assay (FDA approved for NASAL specimens in patients 57 years of age and older), is one component of a comprehensive surveillance program. It is not intended to diagnose infection nor to guide or monitor treatment. Performed at [facility], [address], [place], [place] [address]      Labs:  CBC: Recent Labs  Lab 12/26/22 1740 12/27/22 1744 12/28/22 1341 12/29/22 0457 12/30/22 0449  WBC 18.8* 16.2* 13.6* 12.5* 9.8  NEUTROABS 16.1* 15.1*  --   --   --   HGB 15.4* 14.4 13.1 11.8* 12.3  HCT 47.7* 45.8 41.9 37.3 39.0  MCV 96.2 100.0 100.2* 97.9 97.5  PLT 330 271 242 221 228   BMP &GFR Recent Labs  Lab 12/26/22 1740 12/27/22 1744 12/28/22 1341 12/29/22 0457 12/30/22 0449  NA 138 137 138 134* 139  K 4.0 4.5 4.0 3.9 3.7  CL 105 104 109 103 109  CO2 23 19* 20* 21* 22  GLUCOSE 106* 122* 156* 103* 96  BUN 31* 27* 50* 52* 49*  CREATININE 0.63 0.74 1.21* 1.10* 0.87  CALCIUM 9.2 8.8* 8.2* 8.1* 8.1*  MG  --  2.1 2.4 2.4 2.5*   Estimated Creatinine Clearance: 31.3 mL/min (by C-G formula based on SCr of 0.87 mg/dL). Liver & Pancreas: Recent Labs  Lab 12/27/22 1744  AST 26  ALT 18  ALKPHOS 55  BILITOT 2.9*  PROT 6.1*  ALBUMIN 2.9*   No results for input(s): "LIPASE", "AMYLASE" in the last 168 hours. No results for input(s): "AMMONIA" in the last 168 hours. Diabetic: No results for input(s): "HGBA1C" in the last 72 hours. No results for input(s): "GLUCAP" in the last 168 hours. Cardiac Enzymes: No results for input(s): "CKTOTAL", "CKMB", "CKMBINDEX",  "TROPONINI" in the last 168 hours. No results for input(s): "PROBNP" in the last 8760 hours. Coagulation Profile:  No results for input(s): "INR", "PROTIME" in the last 168 hours. Thyroid Function Tests: No results for input(s): "TSH", "T4TOTAL", "FREET4", "T3FREE", "THYROIDAB" in the last 72 hours. Lipid Profile: No results for input(s): "CHOL", "HDL", "LDLCALC", "TRIG", "CHOLHDL", "LDLDIRECT" in the last 72 hours. Anemia Panel: No results for input(s): "VITAMINB12", "FOLATE", "FERRITIN", "TIBC", "IRON", "RETICCTPCT" in the last 72 hours. Urine analysis:    Component Value Date/Time   COLORURINE YELLOW 12/26/2022 1812   APPEARANCEUR CLEAR 12/26/2022 1812   LABSPEC 1.020 12/26/2022 1812   PHURINE 5.0 12/26/2022 1812   GLUCOSEU NEGATIVE 12/26/2022 1812   HGBUR NEGATIVE 12/26/2022 1812   BILIRUBINUR NEGATIVE 12/26/2022 1812   KETONESUR 5 (A) 12/26/2022 1812   PROTEINUR 100 (A) 12/26/2022 1812   NITRITE NEGATIVE 12/26/2022 1812   LEUKOCYTESUR NEGATIVE 12/26/2022 1812   Sepsis Labs: Invalid input(s): "PROCALCITONIN", "LACTICIDVEN"   SIGNED:  Almon Hercules, MD  [facility] 12/31/2022, 8:10 AM

## 2022-12-31 NOTE — Plan of Care (Signed)
  Problem: Education: Goal: Knowledge of General Education information will improve Description: Including pain rating scale, medication(s)/side effects and non-pharmacologic comfort measures Outcome: Adequate for Discharge   Problem: Health Behavior/Discharge Planning: Goal: Ability to manage health-related needs will improve Outcome: Adequate for Discharge   Problem: Clinical Measurements: Goal: Ability to maintain clinical measurements within normal limits will improve Outcome: Adequate for Discharge Goal: Will remain free from infection Outcome: Adequate for Discharge Goal: Diagnostic test results will improve Outcome: Adequate for Discharge Goal: Respiratory complications will improve Outcome: Adequate for Discharge Goal: Cardiovascular complication will be avoided Outcome: Adequate for Discharge   Problem: Activity: Goal: Risk for activity intolerance will decrease Outcome: Adequate for Discharge   Problem: Nutrition: Goal: Adequate nutrition will be maintained Outcome: Adequate for Discharge   Problem: Coping: Goal: Level of anxiety will decrease Outcome: Adequate for Discharge   Problem: Elimination: Goal: Will not experience complications related to bowel motility Outcome: Adequate for Discharge Goal: Will not experience complications related to urinary retention Outcome: Adequate for Discharge   Problem: Pain Managment: Goal: General experience of comfort will improve Outcome: Adequate for Discharge   Problem: Safety: Goal: Ability to remain free from injury will improve Outcome: Adequate for Discharge   Problem: Skin Integrity: Goal: Risk for impaired skin integrity will decrease Outcome: Adequate for Discharge   Problem: Education: Goal: Knowledge of the prescribed therapeutic regimen will improve Outcome: Adequate for Discharge Goal: Understanding of discharge needs will improve Outcome: Adequate for Discharge Goal: Individualized Educational  Video(s) Outcome: Adequate for Discharge   Problem: Activity: Goal: Ability to avoid complications of mobility impairment will improve Outcome: Adequate for Discharge Goal: Ability to tolerate increased activity will improve Outcome: Adequate for Discharge   Problem: Clinical Measurements: Goal: Postoperative complications will be avoided or minimized Outcome: Adequate for Discharge   Problem: Pain Management: Goal: Pain level will decrease with appropriate interventions Outcome: Adequate for Discharge   Problem: Skin Integrity: Goal: Will show signs of wound healing Outcome: Adequate for Discharge   Problem: Acute Rehab PT Goals(only PT should resolve) Goal: Pt Will Go Supine/Side To Sit Outcome: Adequate for Discharge Goal: Pt Will Go Sit To Supine/Side Outcome: Adequate for Discharge Goal: Patient Will Transfer Sit To/From Stand Outcome: Adequate for Discharge Goal: Pt Will Transfer Bed To Chair/Chair To Bed Outcome: Adequate for Discharge Goal: Pt Will Ambulate Outcome: Adequate for Discharge

## 2022-12-31 NOTE — TOC Transition Note (Signed)
Transition of Care [facility]) - CM/SW Discharge Note   Patient Details  Name: Brianna Bush MRN: [id number] Date of Birth: December 09, 1923  [facility]) CM/SW Contact:  Howell Rucks, RN Phone Number: 12/31/2022, 1:44 PM   Clinical Narrative:  DC to [name], RM 118, nurse call report [phone]. PTAR called. No further TOC needs identified.     Final next level of care: Skilled Nursing Facility Barriers to Discharge: Barriers Resolved   Patient Goals and CMS Choice CMS Medicare.gov Compare Post Acute Care list provided to:: Patient Represenative (must comment) [name], [name] (Daughter)  [phone] (Mobile) Choice offered to / list presented to : Adult Children  Discharge Placement                Patient chooses bed at: [facility] Patient to be transferred to facility by: PTAR Name of family member notified: Sihaam, Stiner (Daughter)  [phone] (Mobile Patient and family notified of of transfer: 12/31/22  Discharge Plan and Services Additional resources added to the After Visit Summary for                                       Social Determinants of Health (SDOH) Interventions SDOH Screenings   Food Insecurity: No Food Insecurity (12/26/2022)  Housing: Low Risk  (12/26/2022)  Transportation Needs: No Transportation Needs (12/26/2022)  Utilities: Not At Risk (12/26/2022)  Tobacco Use: Low Risk  (12/26/2022)     Readmission Risk Interventions    12/31/2022    9:31 AM 12/28/2022   12:18 PM  Readmission Risk Prevention Plan  Post Dischage Appt  Complete  Medication Screening  Complete  Transportation Screening Complete Complete  PCP or Specialist Appt within 5-7 Days Complete   Home Care Screening Complete   Medication Review (RN CM) Complete

## 2023-01-10 DIAGNOSIS — K279 Peptic ulcer, site unspecified, unspecified as acute or chronic, without hemorrhage or perforation: Secondary | ICD-10-CM | POA: Diagnosis not present

## 2023-01-10 DIAGNOSIS — Z9181 History of falling: Secondary | ICD-10-CM | POA: Diagnosis not present

## 2023-01-10 DIAGNOSIS — R918 Other nonspecific abnormal finding of lung field: Secondary | ICD-10-CM | POA: Diagnosis not present

## 2023-01-10 DIAGNOSIS — I5032 Chronic diastolic (congestive) heart failure: Secondary | ICD-10-CM | POA: Diagnosis not present

## 2023-01-10 DIAGNOSIS — Z789 Other specified health status: Secondary | ICD-10-CM | POA: Diagnosis not present

## 2023-01-10 DIAGNOSIS — R262 Difficulty in walking, not elsewhere classified: Secondary | ICD-10-CM | POA: Diagnosis not present

## 2023-01-10 DIAGNOSIS — I110 Hypertensive heart disease with heart failure: Secondary | ICD-10-CM | POA: Diagnosis not present

## 2023-01-10 DIAGNOSIS — S72002D Fracture of unspecified part of neck of left femur, subsequent encounter for closed fracture with routine healing: Secondary | ICD-10-CM | POA: Diagnosis not present

## 2023-01-10 DIAGNOSIS — Z87448 Personal history of other diseases of urinary system: Secondary | ICD-10-CM | POA: Diagnosis not present

## 2023-01-14 ENCOUNTER — Ambulatory Visit (INDEPENDENT_AMBULATORY_CARE_PROVIDER_SITE_OTHER): Payer: Medicare HMO | Admitting: Orthopaedic Surgery

## 2023-01-14 ENCOUNTER — Encounter: Payer: Self-pay | Admitting: Orthopaedic Surgery

## 2023-01-14 ENCOUNTER — Other Ambulatory Visit (INDEPENDENT_AMBULATORY_CARE_PROVIDER_SITE_OTHER): Payer: Medicare HMO

## 2023-01-14 DIAGNOSIS — Z96649 Presence of unspecified artificial hip joint: Secondary | ICD-10-CM

## 2023-01-14 NOTE — Progress Notes (Signed)
The patient is a 89 year old female who is 2 weeks out from a mechanical fall in which she sustained a femoral neck fracture that was displaced.  We performed a bipolar anterior hemiarthroplasty.  She has been convalescing in skilled nursing facility and doing well.  Family is with her today and plan to get her home by the end of the week.  Her left hip incision looks good.  The staples were removed and Steri-Strips applied.  Her leg lengths are equal.  She does tolerate me putting her hip through range of motion.  An AP pelvis and lateral left hip shows a well-seated bipolar hemiarthroplasty with no complicating features.  I did talk in length to the family about the goals of having her work on her mobility with only being up with family and an assistive device.  I can see her back in 4 weeks to see how she is doing overall but no x-rays are needed unless there are issues.

## 2023-01-15 DIAGNOSIS — S72012D Unspecified intracapsular fracture of left femur, subsequent encounter for closed fracture with routine healing: Secondary | ICD-10-CM | POA: Diagnosis not present

## 2023-01-15 DIAGNOSIS — K589 Irritable bowel syndrome without diarrhea: Secondary | ICD-10-CM | POA: Diagnosis not present

## 2023-01-15 DIAGNOSIS — M8580 Other specified disorders of bone density and structure, unspecified site: Secondary | ICD-10-CM | POA: Diagnosis not present

## 2023-01-15 DIAGNOSIS — J9601 Acute respiratory failure with hypoxia: Secondary | ICD-10-CM | POA: Diagnosis not present

## 2023-01-15 DIAGNOSIS — R262 Difficulty in walking, not elsewhere classified: Secondary | ICD-10-CM | POA: Diagnosis not present

## 2023-01-18 DIAGNOSIS — J9601 Acute respiratory failure with hypoxia: Secondary | ICD-10-CM | POA: Diagnosis not present

## 2023-01-18 DIAGNOSIS — J9600 Acute respiratory failure, unspecified whether with hypoxia or hypercapnia: Secondary | ICD-10-CM | POA: Diagnosis not present

## 2023-01-18 DIAGNOSIS — Z9181 History of falling: Secondary | ICD-10-CM | POA: Diagnosis not present

## 2023-01-18 DIAGNOSIS — Z96642 Presence of left artificial hip joint: Secondary | ICD-10-CM | POA: Diagnosis not present

## 2023-01-18 DIAGNOSIS — J690 Pneumonitis due to inhalation of food and vomit: Secondary | ICD-10-CM | POA: Diagnosis not present

## 2023-01-18 DIAGNOSIS — S72012D Unspecified intracapsular fracture of left femur, subsequent encounter for closed fracture with routine healing: Secondary | ICD-10-CM | POA: Diagnosis not present

## 2023-01-18 DIAGNOSIS — N1831 Chronic kidney disease, stage 3a: Secondary | ICD-10-CM | POA: Diagnosis not present

## 2023-01-18 DIAGNOSIS — N179 Acute kidney failure, unspecified: Secondary | ICD-10-CM | POA: Diagnosis not present

## 2023-01-18 DIAGNOSIS — I130 Hypertensive heart and chronic kidney disease with heart failure and stage 1 through stage 4 chronic kidney disease, or unspecified chronic kidney disease: Secondary | ICD-10-CM | POA: Diagnosis not present

## 2023-01-18 DIAGNOSIS — S72002D Fracture of unspecified part of neck of left femur, subsequent encounter for closed fracture with routine healing: Secondary | ICD-10-CM | POA: Diagnosis not present

## 2023-01-18 DIAGNOSIS — I5033 Acute on chronic diastolic (congestive) heart failure: Secondary | ICD-10-CM | POA: Diagnosis not present

## 2023-01-20 ENCOUNTER — Telehealth: Payer: Self-pay | Admitting: Orthopaedic Surgery

## 2023-01-20 DIAGNOSIS — N179 Acute kidney failure, unspecified: Secondary | ICD-10-CM | POA: Diagnosis not present

## 2023-01-20 DIAGNOSIS — I5033 Acute on chronic diastolic (congestive) heart failure: Secondary | ICD-10-CM | POA: Diagnosis not present

## 2023-01-20 DIAGNOSIS — Z9181 History of falling: Secondary | ICD-10-CM | POA: Diagnosis not present

## 2023-01-20 DIAGNOSIS — Z96642 Presence of left artificial hip joint: Secondary | ICD-10-CM | POA: Diagnosis not present

## 2023-01-20 DIAGNOSIS — J690 Pneumonitis due to inhalation of food and vomit: Secondary | ICD-10-CM | POA: Diagnosis not present

## 2023-01-20 DIAGNOSIS — I130 Hypertensive heart and chronic kidney disease with heart failure and stage 1 through stage 4 chronic kidney disease, or unspecified chronic kidney disease: Secondary | ICD-10-CM | POA: Diagnosis not present

## 2023-01-20 DIAGNOSIS — S72002D Fracture of unspecified part of neck of left femur, subsequent encounter for closed fracture with routine healing: Secondary | ICD-10-CM | POA: Diagnosis not present

## 2023-01-20 DIAGNOSIS — J9601 Acute respiratory failure with hypoxia: Secondary | ICD-10-CM | POA: Diagnosis not present

## 2023-01-20 DIAGNOSIS — N1831 Chronic kidney disease, stage 3a: Secondary | ICD-10-CM | POA: Diagnosis not present

## 2023-01-20 DIAGNOSIS — S72012D Unspecified intracapsular fracture of left femur, subsequent encounter for closed fracture with routine healing: Secondary | ICD-10-CM | POA: Diagnosis not present

## 2023-01-20 NOTE — Telephone Encounter (Signed)
Called and gave verbal ok

## 2023-01-20 NOTE — Telephone Encounter (Signed)
[facility] w/ [facility] called for VO HHPT 2x/wk for 3 wks and 1x/wk for 3 weeks.  [phone]

## 2023-01-20 NOTE — Telephone Encounter (Signed)
Michelle from [facility] home health called OT needs to see her 1 time a week for one week, 2 times a week for two weeks, then 1 time a week for 3 weeks. Increase independence and safety with activities of daily living and home safety assessment. [phone]

## 2023-01-20 NOTE — Telephone Encounter (Signed)
Lvm giving verbal ok

## 2023-01-22 DIAGNOSIS — Z96642 Presence of left artificial hip joint: Secondary | ICD-10-CM | POA: Diagnosis not present

## 2023-01-22 DIAGNOSIS — Z9181 History of falling: Secondary | ICD-10-CM | POA: Diagnosis not present

## 2023-01-22 DIAGNOSIS — S72002D Fracture of unspecified part of neck of left femur, subsequent encounter for closed fracture with routine healing: Secondary | ICD-10-CM | POA: Diagnosis not present

## 2023-01-22 DIAGNOSIS — I130 Hypertensive heart and chronic kidney disease with heart failure and stage 1 through stage 4 chronic kidney disease, or unspecified chronic kidney disease: Secondary | ICD-10-CM | POA: Diagnosis not present

## 2023-01-22 DIAGNOSIS — N1831 Chronic kidney disease, stage 3a: Secondary | ICD-10-CM | POA: Diagnosis not present

## 2023-01-22 DIAGNOSIS — J9601 Acute respiratory failure with hypoxia: Secondary | ICD-10-CM | POA: Diagnosis not present

## 2023-01-22 DIAGNOSIS — N179 Acute kidney failure, unspecified: Secondary | ICD-10-CM | POA: Diagnosis not present

## 2023-01-22 DIAGNOSIS — J690 Pneumonitis due to inhalation of food and vomit: Secondary | ICD-10-CM | POA: Diagnosis not present

## 2023-01-22 DIAGNOSIS — S72012D Unspecified intracapsular fracture of left femur, subsequent encounter for closed fracture with routine healing: Secondary | ICD-10-CM | POA: Diagnosis not present

## 2023-01-22 DIAGNOSIS — I5033 Acute on chronic diastolic (congestive) heart failure: Secondary | ICD-10-CM | POA: Diagnosis not present

## 2023-01-25 DIAGNOSIS — S72012D Unspecified intracapsular fracture of left femur, subsequent encounter for closed fracture with routine healing: Secondary | ICD-10-CM | POA: Diagnosis not present

## 2023-01-25 DIAGNOSIS — N1831 Chronic kidney disease, stage 3a: Secondary | ICD-10-CM | POA: Diagnosis not present

## 2023-01-25 DIAGNOSIS — S72002D Fracture of unspecified part of neck of left femur, subsequent encounter for closed fracture with routine healing: Secondary | ICD-10-CM | POA: Diagnosis not present

## 2023-01-25 DIAGNOSIS — J9601 Acute respiratory failure with hypoxia: Secondary | ICD-10-CM | POA: Diagnosis not present

## 2023-01-25 DIAGNOSIS — Z96642 Presence of left artificial hip joint: Secondary | ICD-10-CM | POA: Diagnosis not present

## 2023-01-25 DIAGNOSIS — N179 Acute kidney failure, unspecified: Secondary | ICD-10-CM | POA: Diagnosis not present

## 2023-01-25 DIAGNOSIS — Z9181 History of falling: Secondary | ICD-10-CM | POA: Diagnosis not present

## 2023-01-25 DIAGNOSIS — I130 Hypertensive heart and chronic kidney disease with heart failure and stage 1 through stage 4 chronic kidney disease, or unspecified chronic kidney disease: Secondary | ICD-10-CM | POA: Diagnosis not present

## 2023-01-25 DIAGNOSIS — I5033 Acute on chronic diastolic (congestive) heart failure: Secondary | ICD-10-CM | POA: Diagnosis not present

## 2023-01-25 DIAGNOSIS — J690 Pneumonitis due to inhalation of food and vomit: Secondary | ICD-10-CM | POA: Diagnosis not present

## 2023-01-26 DIAGNOSIS — Z9181 History of falling: Secondary | ICD-10-CM | POA: Diagnosis not present

## 2023-01-26 DIAGNOSIS — I5033 Acute on chronic diastolic (congestive) heart failure: Secondary | ICD-10-CM | POA: Diagnosis not present

## 2023-01-26 DIAGNOSIS — Z96642 Presence of left artificial hip joint: Secondary | ICD-10-CM | POA: Diagnosis not present

## 2023-01-26 DIAGNOSIS — J690 Pneumonitis due to inhalation of food and vomit: Secondary | ICD-10-CM | POA: Diagnosis not present

## 2023-01-26 DIAGNOSIS — S72012D Unspecified intracapsular fracture of left femur, subsequent encounter for closed fracture with routine healing: Secondary | ICD-10-CM | POA: Diagnosis not present

## 2023-01-26 DIAGNOSIS — J9601 Acute respiratory failure with hypoxia: Secondary | ICD-10-CM | POA: Diagnosis not present

## 2023-01-26 DIAGNOSIS — N1831 Chronic kidney disease, stage 3a: Secondary | ICD-10-CM | POA: Diagnosis not present

## 2023-01-26 DIAGNOSIS — I130 Hypertensive heart and chronic kidney disease with heart failure and stage 1 through stage 4 chronic kidney disease, or unspecified chronic kidney disease: Secondary | ICD-10-CM | POA: Diagnosis not present

## 2023-01-26 DIAGNOSIS — S72002D Fracture of unspecified part of neck of left femur, subsequent encounter for closed fracture with routine healing: Secondary | ICD-10-CM | POA: Diagnosis not present

## 2023-01-26 DIAGNOSIS — N179 Acute kidney failure, unspecified: Secondary | ICD-10-CM | POA: Diagnosis not present

## 2023-01-28 DIAGNOSIS — N179 Acute kidney failure, unspecified: Secondary | ICD-10-CM | POA: Diagnosis not present

## 2023-01-28 DIAGNOSIS — S72002D Fracture of unspecified part of neck of left femur, subsequent encounter for closed fracture with routine healing: Secondary | ICD-10-CM | POA: Diagnosis not present

## 2023-01-28 DIAGNOSIS — J9601 Acute respiratory failure with hypoxia: Secondary | ICD-10-CM | POA: Diagnosis not present

## 2023-01-28 DIAGNOSIS — S72012D Unspecified intracapsular fracture of left femur, subsequent encounter for closed fracture with routine healing: Secondary | ICD-10-CM | POA: Diagnosis not present

## 2023-01-28 DIAGNOSIS — I130 Hypertensive heart and chronic kidney disease with heart failure and stage 1 through stage 4 chronic kidney disease, or unspecified chronic kidney disease: Secondary | ICD-10-CM | POA: Diagnosis not present

## 2023-01-28 DIAGNOSIS — Z96642 Presence of left artificial hip joint: Secondary | ICD-10-CM | POA: Diagnosis not present

## 2023-01-28 DIAGNOSIS — I5033 Acute on chronic diastolic (congestive) heart failure: Secondary | ICD-10-CM | POA: Diagnosis not present

## 2023-01-28 DIAGNOSIS — N1831 Chronic kidney disease, stage 3a: Secondary | ICD-10-CM | POA: Diagnosis not present

## 2023-01-28 DIAGNOSIS — J690 Pneumonitis due to inhalation of food and vomit: Secondary | ICD-10-CM | POA: Diagnosis not present

## 2023-01-28 DIAGNOSIS — Z9181 History of falling: Secondary | ICD-10-CM | POA: Diagnosis not present

## 2023-01-29 DIAGNOSIS — S72002D Fracture of unspecified part of neck of left femur, subsequent encounter for closed fracture with routine healing: Secondary | ICD-10-CM | POA: Diagnosis not present

## 2023-01-29 DIAGNOSIS — J690 Pneumonitis due to inhalation of food and vomit: Secondary | ICD-10-CM | POA: Diagnosis not present

## 2023-01-29 DIAGNOSIS — J9601 Acute respiratory failure with hypoxia: Secondary | ICD-10-CM | POA: Diagnosis not present

## 2023-01-29 DIAGNOSIS — N179 Acute kidney failure, unspecified: Secondary | ICD-10-CM | POA: Diagnosis not present

## 2023-01-29 DIAGNOSIS — I5033 Acute on chronic diastolic (congestive) heart failure: Secondary | ICD-10-CM | POA: Diagnosis not present

## 2023-01-29 DIAGNOSIS — Z96642 Presence of left artificial hip joint: Secondary | ICD-10-CM | POA: Diagnosis not present

## 2023-01-29 DIAGNOSIS — Z9181 History of falling: Secondary | ICD-10-CM | POA: Diagnosis not present

## 2023-01-29 DIAGNOSIS — N1831 Chronic kidney disease, stage 3a: Secondary | ICD-10-CM | POA: Diagnosis not present

## 2023-01-29 DIAGNOSIS — S72012D Unspecified intracapsular fracture of left femur, subsequent encounter for closed fracture with routine healing: Secondary | ICD-10-CM | POA: Diagnosis not present

## 2023-01-29 DIAGNOSIS — I130 Hypertensive heart and chronic kidney disease with heart failure and stage 1 through stage 4 chronic kidney disease, or unspecified chronic kidney disease: Secondary | ICD-10-CM | POA: Diagnosis not present

## 2023-02-01 DIAGNOSIS — Z96642 Presence of left artificial hip joint: Secondary | ICD-10-CM | POA: Diagnosis not present

## 2023-02-01 DIAGNOSIS — S72012D Unspecified intracapsular fracture of left femur, subsequent encounter for closed fracture with routine healing: Secondary | ICD-10-CM | POA: Diagnosis not present

## 2023-02-01 DIAGNOSIS — S72002D Fracture of unspecified part of neck of left femur, subsequent encounter for closed fracture with routine healing: Secondary | ICD-10-CM | POA: Diagnosis not present

## 2023-02-01 DIAGNOSIS — N179 Acute kidney failure, unspecified: Secondary | ICD-10-CM | POA: Diagnosis not present

## 2023-02-01 DIAGNOSIS — I5033 Acute on chronic diastolic (congestive) heart failure: Secondary | ICD-10-CM | POA: Diagnosis not present

## 2023-02-01 DIAGNOSIS — Z9181 History of falling: Secondary | ICD-10-CM | POA: Diagnosis not present

## 2023-02-01 DIAGNOSIS — J690 Pneumonitis due to inhalation of food and vomit: Secondary | ICD-10-CM | POA: Diagnosis not present

## 2023-02-01 DIAGNOSIS — I130 Hypertensive heart and chronic kidney disease with heart failure and stage 1 through stage 4 chronic kidney disease, or unspecified chronic kidney disease: Secondary | ICD-10-CM | POA: Diagnosis not present

## 2023-02-01 DIAGNOSIS — J9601 Acute respiratory failure with hypoxia: Secondary | ICD-10-CM | POA: Diagnosis not present

## 2023-02-01 DIAGNOSIS — N1831 Chronic kidney disease, stage 3a: Secondary | ICD-10-CM | POA: Diagnosis not present

## 2023-02-02 DIAGNOSIS — S72012D Unspecified intracapsular fracture of left femur, subsequent encounter for closed fracture with routine healing: Secondary | ICD-10-CM | POA: Diagnosis not present

## 2023-02-02 DIAGNOSIS — Z9181 History of falling: Secondary | ICD-10-CM | POA: Diagnosis not present

## 2023-02-02 DIAGNOSIS — N1831 Chronic kidney disease, stage 3a: Secondary | ICD-10-CM | POA: Diagnosis not present

## 2023-02-02 DIAGNOSIS — J9601 Acute respiratory failure with hypoxia: Secondary | ICD-10-CM | POA: Diagnosis not present

## 2023-02-02 DIAGNOSIS — N179 Acute kidney failure, unspecified: Secondary | ICD-10-CM | POA: Diagnosis not present

## 2023-02-02 DIAGNOSIS — Z96642 Presence of left artificial hip joint: Secondary | ICD-10-CM | POA: Diagnosis not present

## 2023-02-02 DIAGNOSIS — I5033 Acute on chronic diastolic (congestive) heart failure: Secondary | ICD-10-CM | POA: Diagnosis not present

## 2023-02-02 DIAGNOSIS — J690 Pneumonitis due to inhalation of food and vomit: Secondary | ICD-10-CM | POA: Diagnosis not present

## 2023-02-02 DIAGNOSIS — S72002D Fracture of unspecified part of neck of left femur, subsequent encounter for closed fracture with routine healing: Secondary | ICD-10-CM | POA: Diagnosis not present

## 2023-02-02 DIAGNOSIS — I130 Hypertensive heart and chronic kidney disease with heart failure and stage 1 through stage 4 chronic kidney disease, or unspecified chronic kidney disease: Secondary | ICD-10-CM | POA: Diagnosis not present

## 2023-02-03 DIAGNOSIS — S72002D Fracture of unspecified part of neck of left femur, subsequent encounter for closed fracture with routine healing: Secondary | ICD-10-CM | POA: Diagnosis not present

## 2023-02-03 DIAGNOSIS — I130 Hypertensive heart and chronic kidney disease with heart failure and stage 1 through stage 4 chronic kidney disease, or unspecified chronic kidney disease: Secondary | ICD-10-CM | POA: Diagnosis not present

## 2023-02-03 DIAGNOSIS — Z9181 History of falling: Secondary | ICD-10-CM | POA: Diagnosis not present

## 2023-02-03 DIAGNOSIS — J9601 Acute respiratory failure with hypoxia: Secondary | ICD-10-CM | POA: Diagnosis not present

## 2023-02-03 DIAGNOSIS — N1831 Chronic kidney disease, stage 3a: Secondary | ICD-10-CM | POA: Diagnosis not present

## 2023-02-03 DIAGNOSIS — Z96642 Presence of left artificial hip joint: Secondary | ICD-10-CM | POA: Diagnosis not present

## 2023-02-03 DIAGNOSIS — J690 Pneumonitis due to inhalation of food and vomit: Secondary | ICD-10-CM | POA: Diagnosis not present

## 2023-02-03 DIAGNOSIS — N179 Acute kidney failure, unspecified: Secondary | ICD-10-CM | POA: Diagnosis not present

## 2023-02-03 DIAGNOSIS — S72012D Unspecified intracapsular fracture of left femur, subsequent encounter for closed fracture with routine healing: Secondary | ICD-10-CM | POA: Diagnosis not present

## 2023-02-03 DIAGNOSIS — I5033 Acute on chronic diastolic (congestive) heart failure: Secondary | ICD-10-CM | POA: Diagnosis not present

## 2023-02-05 DIAGNOSIS — J690 Pneumonitis due to inhalation of food and vomit: Secondary | ICD-10-CM | POA: Diagnosis not present

## 2023-02-05 DIAGNOSIS — N1831 Chronic kidney disease, stage 3a: Secondary | ICD-10-CM | POA: Diagnosis not present

## 2023-02-05 DIAGNOSIS — Z96642 Presence of left artificial hip joint: Secondary | ICD-10-CM | POA: Diagnosis not present

## 2023-02-05 DIAGNOSIS — I130 Hypertensive heart and chronic kidney disease with heart failure and stage 1 through stage 4 chronic kidney disease, or unspecified chronic kidney disease: Secondary | ICD-10-CM | POA: Diagnosis not present

## 2023-02-05 DIAGNOSIS — N179 Acute kidney failure, unspecified: Secondary | ICD-10-CM | POA: Diagnosis not present

## 2023-02-05 DIAGNOSIS — S72012D Unspecified intracapsular fracture of left femur, subsequent encounter for closed fracture with routine healing: Secondary | ICD-10-CM | POA: Diagnosis not present

## 2023-02-05 DIAGNOSIS — S72002D Fracture of unspecified part of neck of left femur, subsequent encounter for closed fracture with routine healing: Secondary | ICD-10-CM | POA: Diagnosis not present

## 2023-02-05 DIAGNOSIS — Z9181 History of falling: Secondary | ICD-10-CM | POA: Diagnosis not present

## 2023-02-05 DIAGNOSIS — I5033 Acute on chronic diastolic (congestive) heart failure: Secondary | ICD-10-CM | POA: Diagnosis not present

## 2023-02-05 DIAGNOSIS — J9601 Acute respiratory failure with hypoxia: Secondary | ICD-10-CM | POA: Diagnosis not present

## 2023-02-08 DIAGNOSIS — Z96642 Presence of left artificial hip joint: Secondary | ICD-10-CM | POA: Diagnosis not present

## 2023-02-08 DIAGNOSIS — N1831 Chronic kidney disease, stage 3a: Secondary | ICD-10-CM | POA: Diagnosis not present

## 2023-02-08 DIAGNOSIS — J9601 Acute respiratory failure with hypoxia: Secondary | ICD-10-CM | POA: Diagnosis not present

## 2023-02-08 DIAGNOSIS — Z9181 History of falling: Secondary | ICD-10-CM | POA: Diagnosis not present

## 2023-02-08 DIAGNOSIS — N179 Acute kidney failure, unspecified: Secondary | ICD-10-CM | POA: Diagnosis not present

## 2023-02-08 DIAGNOSIS — I130 Hypertensive heart and chronic kidney disease with heart failure and stage 1 through stage 4 chronic kidney disease, or unspecified chronic kidney disease: Secondary | ICD-10-CM | POA: Diagnosis not present

## 2023-02-08 DIAGNOSIS — J690 Pneumonitis due to inhalation of food and vomit: Secondary | ICD-10-CM | POA: Diagnosis not present

## 2023-02-08 DIAGNOSIS — S72002D Fracture of unspecified part of neck of left femur, subsequent encounter for closed fracture with routine healing: Secondary | ICD-10-CM | POA: Diagnosis not present

## 2023-02-08 DIAGNOSIS — S72012D Unspecified intracapsular fracture of left femur, subsequent encounter for closed fracture with routine healing: Secondary | ICD-10-CM | POA: Diagnosis not present

## 2023-02-08 DIAGNOSIS — I5033 Acute on chronic diastolic (congestive) heart failure: Secondary | ICD-10-CM | POA: Diagnosis not present

## 2023-02-10 ENCOUNTER — Encounter: Payer: Self-pay | Admitting: Orthopaedic Surgery

## 2023-02-10 ENCOUNTER — Ambulatory Visit: Payer: Medicare HMO | Admitting: Orthopaedic Surgery

## 2023-02-10 DIAGNOSIS — Z96649 Presence of unspecified artificial hip joint: Secondary | ICD-10-CM

## 2023-02-10 NOTE — Progress Notes (Signed)
The patient is a 89 year old who is now 6 weeks out from a left hip hemiarthroplasty to treat an acute left hip femoral neck fracture.  She is doing well.  Her family is with her and said that she has been able to walk some.  She still gets foot and ankle swelling on the left side and has not been wearing compressive socks.  On exam her left hip incision looks good.  She tolerates me putting her hip through range of motion as well.  I did talk to her about elevation and compressive socks.  From my standpoint the next time I will see her in 3 months.  At that visit we will have a standing low AP pelvis and lateral of her left hip.  If there are issues before then they know to let us know.

## 2023-02-11 ENCOUNTER — Telehealth: Payer: Self-pay | Admitting: Orthopaedic Surgery

## 2023-02-11 ENCOUNTER — Other Ambulatory Visit: Payer: Self-pay | Admitting: Orthopaedic Surgery

## 2023-02-11 DIAGNOSIS — N179 Acute kidney failure, unspecified: Secondary | ICD-10-CM | POA: Diagnosis not present

## 2023-02-11 DIAGNOSIS — I5033 Acute on chronic diastolic (congestive) heart failure: Secondary | ICD-10-CM | POA: Diagnosis not present

## 2023-02-11 DIAGNOSIS — Z96642 Presence of left artificial hip joint: Secondary | ICD-10-CM | POA: Diagnosis not present

## 2023-02-11 DIAGNOSIS — I130 Hypertensive heart and chronic kidney disease with heart failure and stage 1 through stage 4 chronic kidney disease, or unspecified chronic kidney disease: Secondary | ICD-10-CM | POA: Diagnosis not present

## 2023-02-11 DIAGNOSIS — J690 Pneumonitis due to inhalation of food and vomit: Secondary | ICD-10-CM | POA: Diagnosis not present

## 2023-02-11 DIAGNOSIS — S72012D Unspecified intracapsular fracture of left femur, subsequent encounter for closed fracture with routine healing: Secondary | ICD-10-CM | POA: Diagnosis not present

## 2023-02-11 DIAGNOSIS — N1831 Chronic kidney disease, stage 3a: Secondary | ICD-10-CM | POA: Diagnosis not present

## 2023-02-11 DIAGNOSIS — Z9181 History of falling: Secondary | ICD-10-CM | POA: Diagnosis not present

## 2023-02-11 DIAGNOSIS — J9601 Acute respiratory failure with hypoxia: Secondary | ICD-10-CM | POA: Diagnosis not present

## 2023-02-11 DIAGNOSIS — S72002D Fracture of unspecified part of neck of left femur, subsequent encounter for closed fracture with routine healing: Secondary | ICD-10-CM | POA: Diagnosis not present

## 2023-02-11 MED ORDER — HYDROCODONE-ACETAMINOPHEN 5-325 MG PO TABS: 1.0000 | ORAL_TABLET | Freq: Four times a day (QID) | ORAL | 0 refills | Status: AC | PRN

## 2023-02-11 MED ORDER — HYDROCODONE-ACETAMINOPHEN 5-325 MG PO TABS: 1.0000 | ORAL_TABLET | Freq: Four times a day (QID) | ORAL | 0 refills | Status: DC | PRN

## 2023-02-11 NOTE — Telephone Encounter (Signed)
Patient called needing a refill on hydrocodone please. Brianna Bush

## 2023-02-14 DIAGNOSIS — M8580 Other specified disorders of bone density and structure, unspecified site: Secondary | ICD-10-CM | POA: Diagnosis not present

## 2023-02-14 DIAGNOSIS — J9601 Acute respiratory failure with hypoxia: Secondary | ICD-10-CM | POA: Diagnosis not present

## 2023-02-14 DIAGNOSIS — S72012D Unspecified intracapsular fracture of left femur, subsequent encounter for closed fracture with routine healing: Secondary | ICD-10-CM | POA: Diagnosis not present

## 2023-02-14 DIAGNOSIS — K589 Irritable bowel syndrome without diarrhea: Secondary | ICD-10-CM | POA: Diagnosis not present

## 2023-02-15 DIAGNOSIS — S72002D Fracture of unspecified part of neck of left femur, subsequent encounter for closed fracture with routine healing: Secondary | ICD-10-CM | POA: Diagnosis not present

## 2023-02-15 DIAGNOSIS — J690 Pneumonitis due to inhalation of food and vomit: Secondary | ICD-10-CM | POA: Diagnosis not present

## 2023-02-15 DIAGNOSIS — N1831 Chronic kidney disease, stage 3a: Secondary | ICD-10-CM | POA: Diagnosis not present

## 2023-02-15 DIAGNOSIS — Z9181 History of falling: Secondary | ICD-10-CM | POA: Diagnosis not present

## 2023-02-15 DIAGNOSIS — I130 Hypertensive heart and chronic kidney disease with heart failure and stage 1 through stage 4 chronic kidney disease, or unspecified chronic kidney disease: Secondary | ICD-10-CM | POA: Diagnosis not present

## 2023-02-15 DIAGNOSIS — N179 Acute kidney failure, unspecified: Secondary | ICD-10-CM | POA: Diagnosis not present

## 2023-02-15 DIAGNOSIS — Z96642 Presence of left artificial hip joint: Secondary | ICD-10-CM | POA: Diagnosis not present

## 2023-02-15 DIAGNOSIS — S72012D Unspecified intracapsular fracture of left femur, subsequent encounter for closed fracture with routine healing: Secondary | ICD-10-CM | POA: Diagnosis not present

## 2023-02-15 DIAGNOSIS — J9601 Acute respiratory failure with hypoxia: Secondary | ICD-10-CM | POA: Diagnosis not present

## 2023-02-15 DIAGNOSIS — I5033 Acute on chronic diastolic (congestive) heart failure: Secondary | ICD-10-CM | POA: Diagnosis not present

## 2023-02-19 DIAGNOSIS — J9601 Acute respiratory failure with hypoxia: Secondary | ICD-10-CM | POA: Diagnosis not present

## 2023-02-19 DIAGNOSIS — Z9181 History of falling: Secondary | ICD-10-CM | POA: Diagnosis not present

## 2023-02-19 DIAGNOSIS — N179 Acute kidney failure, unspecified: Secondary | ICD-10-CM | POA: Diagnosis not present

## 2023-02-19 DIAGNOSIS — I5033 Acute on chronic diastolic (congestive) heart failure: Secondary | ICD-10-CM | POA: Diagnosis not present

## 2023-02-19 DIAGNOSIS — Z96642 Presence of left artificial hip joint: Secondary | ICD-10-CM | POA: Diagnosis not present

## 2023-02-19 DIAGNOSIS — N1831 Chronic kidney disease, stage 3a: Secondary | ICD-10-CM | POA: Diagnosis not present

## 2023-02-19 DIAGNOSIS — S72012D Unspecified intracapsular fracture of left femur, subsequent encounter for closed fracture with routine healing: Secondary | ICD-10-CM | POA: Diagnosis not present

## 2023-02-19 DIAGNOSIS — I130 Hypertensive heart and chronic kidney disease with heart failure and stage 1 through stage 4 chronic kidney disease, or unspecified chronic kidney disease: Secondary | ICD-10-CM | POA: Diagnosis not present

## 2023-02-19 DIAGNOSIS — S72002D Fracture of unspecified part of neck of left femur, subsequent encounter for closed fracture with routine healing: Secondary | ICD-10-CM | POA: Diagnosis not present

## 2023-02-19 DIAGNOSIS — J690 Pneumonitis due to inhalation of food and vomit: Secondary | ICD-10-CM | POA: Diagnosis not present

## 2023-02-22 DIAGNOSIS — J690 Pneumonitis due to inhalation of food and vomit: Secondary | ICD-10-CM | POA: Diagnosis not present

## 2023-02-22 DIAGNOSIS — N179 Acute kidney failure, unspecified: Secondary | ICD-10-CM | POA: Diagnosis not present

## 2023-02-22 DIAGNOSIS — S72012D Unspecified intracapsular fracture of left femur, subsequent encounter for closed fracture with routine healing: Secondary | ICD-10-CM | POA: Diagnosis not present

## 2023-02-22 DIAGNOSIS — Z96642 Presence of left artificial hip joint: Secondary | ICD-10-CM | POA: Diagnosis not present

## 2023-02-22 DIAGNOSIS — J9601 Acute respiratory failure with hypoxia: Secondary | ICD-10-CM | POA: Diagnosis not present

## 2023-02-22 DIAGNOSIS — I5033 Acute on chronic diastolic (congestive) heart failure: Secondary | ICD-10-CM | POA: Diagnosis not present

## 2023-02-22 DIAGNOSIS — Z9181 History of falling: Secondary | ICD-10-CM | POA: Diagnosis not present

## 2023-02-22 DIAGNOSIS — N1831 Chronic kidney disease, stage 3a: Secondary | ICD-10-CM | POA: Diagnosis not present

## 2023-02-22 DIAGNOSIS — S72002D Fracture of unspecified part of neck of left femur, subsequent encounter for closed fracture with routine healing: Secondary | ICD-10-CM | POA: Diagnosis not present

## 2023-02-22 DIAGNOSIS — I130 Hypertensive heart and chronic kidney disease with heart failure and stage 1 through stage 4 chronic kidney disease, or unspecified chronic kidney disease: Secondary | ICD-10-CM | POA: Diagnosis not present

## 2023-02-23 ENCOUNTER — Telehealth: Payer: Self-pay | Admitting: Orthopaedic Surgery

## 2023-02-23 DIAGNOSIS — I700 Atherosclerosis of aorta: Secondary | ICD-10-CM | POA: Diagnosis not present

## 2023-02-23 DIAGNOSIS — N1831 Chronic kidney disease, stage 3a: Secondary | ICD-10-CM | POA: Diagnosis not present

## 2023-02-23 DIAGNOSIS — Z8781 Personal history of (healed) traumatic fracture: Secondary | ICD-10-CM | POA: Diagnosis not present

## 2023-02-23 DIAGNOSIS — E7800 Pure hypercholesterolemia, unspecified: Secondary | ICD-10-CM | POA: Diagnosis not present

## 2023-02-23 NOTE — Telephone Encounter (Signed)
Verbal order given  

## 2023-02-23 NOTE — Telephone Encounter (Signed)
Received call from [facility] with [facility] needing to extend [facility] starting 03/01/2023    1 Wk 3   The number to contact Marcelino Duster is [phone]

## 2023-02-25 DIAGNOSIS — H903 Sensorineural hearing loss, bilateral: Secondary | ICD-10-CM | POA: Diagnosis not present

## 2023-02-25 DIAGNOSIS — H6123 Impacted cerumen, bilateral: Secondary | ICD-10-CM | POA: Diagnosis not present

## 2023-03-01 DIAGNOSIS — J690 Pneumonitis due to inhalation of food and vomit: Secondary | ICD-10-CM | POA: Diagnosis not present

## 2023-03-01 DIAGNOSIS — S72012D Unspecified intracapsular fracture of left femur, subsequent encounter for closed fracture with routine healing: Secondary | ICD-10-CM | POA: Diagnosis not present

## 2023-03-01 DIAGNOSIS — I130 Hypertensive heart and chronic kidney disease with heart failure and stage 1 through stage 4 chronic kidney disease, or unspecified chronic kidney disease: Secondary | ICD-10-CM | POA: Diagnosis not present

## 2023-03-01 DIAGNOSIS — Z9181 History of falling: Secondary | ICD-10-CM | POA: Diagnosis not present

## 2023-03-01 DIAGNOSIS — N1831 Chronic kidney disease, stage 3a: Secondary | ICD-10-CM | POA: Diagnosis not present

## 2023-03-01 DIAGNOSIS — J9601 Acute respiratory failure with hypoxia: Secondary | ICD-10-CM | POA: Diagnosis not present

## 2023-03-01 DIAGNOSIS — I5033 Acute on chronic diastolic (congestive) heart failure: Secondary | ICD-10-CM | POA: Diagnosis not present

## 2023-03-01 DIAGNOSIS — N179 Acute kidney failure, unspecified: Secondary | ICD-10-CM | POA: Diagnosis not present

## 2023-03-01 DIAGNOSIS — Z96642 Presence of left artificial hip joint: Secondary | ICD-10-CM | POA: Diagnosis not present

## 2023-03-01 DIAGNOSIS — S72002D Fracture of unspecified part of neck of left femur, subsequent encounter for closed fracture with routine healing: Secondary | ICD-10-CM | POA: Diagnosis not present

## 2023-03-02 DIAGNOSIS — N1831 Chronic kidney disease, stage 3a: Secondary | ICD-10-CM | POA: Diagnosis not present

## 2023-03-02 DIAGNOSIS — N179 Acute kidney failure, unspecified: Secondary | ICD-10-CM | POA: Diagnosis not present

## 2023-03-02 DIAGNOSIS — S72002D Fracture of unspecified part of neck of left femur, subsequent encounter for closed fracture with routine healing: Secondary | ICD-10-CM | POA: Diagnosis not present

## 2023-03-02 DIAGNOSIS — I5033 Acute on chronic diastolic (congestive) heart failure: Secondary | ICD-10-CM | POA: Diagnosis not present

## 2023-03-02 DIAGNOSIS — J690 Pneumonitis due to inhalation of food and vomit: Secondary | ICD-10-CM | POA: Diagnosis not present

## 2023-03-02 DIAGNOSIS — Z96642 Presence of left artificial hip joint: Secondary | ICD-10-CM | POA: Diagnosis not present

## 2023-03-02 DIAGNOSIS — Z9181 History of falling: Secondary | ICD-10-CM | POA: Diagnosis not present

## 2023-03-02 DIAGNOSIS — I130 Hypertensive heart and chronic kidney disease with heart failure and stage 1 through stage 4 chronic kidney disease, or unspecified chronic kidney disease: Secondary | ICD-10-CM | POA: Diagnosis not present

## 2023-03-02 DIAGNOSIS — J9601 Acute respiratory failure with hypoxia: Secondary | ICD-10-CM | POA: Diagnosis not present

## 2023-03-02 DIAGNOSIS — S72012D Unspecified intracapsular fracture of left femur, subsequent encounter for closed fracture with routine healing: Secondary | ICD-10-CM | POA: Diagnosis not present

## 2023-03-09 DIAGNOSIS — N179 Acute kidney failure, unspecified: Secondary | ICD-10-CM | POA: Diagnosis not present

## 2023-03-09 DIAGNOSIS — J690 Pneumonitis due to inhalation of food and vomit: Secondary | ICD-10-CM | POA: Diagnosis not present

## 2023-03-09 DIAGNOSIS — S72002D Fracture of unspecified part of neck of left femur, subsequent encounter for closed fracture with routine healing: Secondary | ICD-10-CM | POA: Diagnosis not present

## 2023-03-09 DIAGNOSIS — N1831 Chronic kidney disease, stage 3a: Secondary | ICD-10-CM | POA: Diagnosis not present

## 2023-03-09 DIAGNOSIS — Z96642 Presence of left artificial hip joint: Secondary | ICD-10-CM | POA: Diagnosis not present

## 2023-03-09 DIAGNOSIS — S72012D Unspecified intracapsular fracture of left femur, subsequent encounter for closed fracture with routine healing: Secondary | ICD-10-CM | POA: Diagnosis not present

## 2023-03-09 DIAGNOSIS — I130 Hypertensive heart and chronic kidney disease with heart failure and stage 1 through stage 4 chronic kidney disease, or unspecified chronic kidney disease: Secondary | ICD-10-CM | POA: Diagnosis not present

## 2023-03-09 DIAGNOSIS — I5033 Acute on chronic diastolic (congestive) heart failure: Secondary | ICD-10-CM | POA: Diagnosis not present

## 2023-03-09 DIAGNOSIS — J9601 Acute respiratory failure with hypoxia: Secondary | ICD-10-CM | POA: Diagnosis not present

## 2023-03-09 DIAGNOSIS — Z9181 History of falling: Secondary | ICD-10-CM | POA: Diagnosis not present

## 2023-03-15 DIAGNOSIS — J690 Pneumonitis due to inhalation of food and vomit: Secondary | ICD-10-CM | POA: Diagnosis not present

## 2023-03-15 DIAGNOSIS — N179 Acute kidney failure, unspecified: Secondary | ICD-10-CM | POA: Diagnosis not present

## 2023-03-15 DIAGNOSIS — S72002D Fracture of unspecified part of neck of left femur, subsequent encounter for closed fracture with routine healing: Secondary | ICD-10-CM | POA: Diagnosis not present

## 2023-03-15 DIAGNOSIS — N1831 Chronic kidney disease, stage 3a: Secondary | ICD-10-CM | POA: Diagnosis not present

## 2023-03-15 DIAGNOSIS — I5033 Acute on chronic diastolic (congestive) heart failure: Secondary | ICD-10-CM | POA: Diagnosis not present

## 2023-03-15 DIAGNOSIS — I130 Hypertensive heart and chronic kidney disease with heart failure and stage 1 through stage 4 chronic kidney disease, or unspecified chronic kidney disease: Secondary | ICD-10-CM | POA: Diagnosis not present

## 2023-03-15 DIAGNOSIS — Z9181 History of falling: Secondary | ICD-10-CM | POA: Diagnosis not present

## 2023-03-15 DIAGNOSIS — J9601 Acute respiratory failure with hypoxia: Secondary | ICD-10-CM | POA: Diagnosis not present

## 2023-03-15 DIAGNOSIS — Z96642 Presence of left artificial hip joint: Secondary | ICD-10-CM | POA: Diagnosis not present

## 2023-03-15 DIAGNOSIS — S72012D Unspecified intracapsular fracture of left femur, subsequent encounter for closed fracture with routine healing: Secondary | ICD-10-CM | POA: Diagnosis not present

## 2023-03-16 DIAGNOSIS — H353231 Exudative age-related macular degeneration, bilateral, with active choroidal neovascularization: Secondary | ICD-10-CM | POA: Diagnosis not present

## 2023-03-17 DIAGNOSIS — K589 Irritable bowel syndrome without diarrhea: Secondary | ICD-10-CM | POA: Diagnosis not present

## 2023-03-17 DIAGNOSIS — S72012D Unspecified intracapsular fracture of left femur, subsequent encounter for closed fracture with routine healing: Secondary | ICD-10-CM | POA: Diagnosis not present

## 2023-03-17 DIAGNOSIS — J9601 Acute respiratory failure with hypoxia: Secondary | ICD-10-CM | POA: Diagnosis not present

## 2023-03-17 DIAGNOSIS — M8580 Other specified disorders of bone density and structure, unspecified site: Secondary | ICD-10-CM | POA: Diagnosis not present

## 2023-04-16 DIAGNOSIS — K589 Irritable bowel syndrome without diarrhea: Secondary | ICD-10-CM | POA: Diagnosis not present

## 2023-04-16 DIAGNOSIS — M8580 Other specified disorders of bone density and structure, unspecified site: Secondary | ICD-10-CM | POA: Diagnosis not present

## 2023-04-16 DIAGNOSIS — H35373 Puckering of macula, bilateral: Secondary | ICD-10-CM | POA: Diagnosis not present

## 2023-04-16 DIAGNOSIS — H43813 Vitreous degeneration, bilateral: Secondary | ICD-10-CM | POA: Diagnosis not present

## 2023-04-16 DIAGNOSIS — J9601 Acute respiratory failure with hypoxia: Secondary | ICD-10-CM | POA: Diagnosis not present

## 2023-04-16 DIAGNOSIS — H34232 Retinal artery branch occlusion, left eye: Secondary | ICD-10-CM | POA: Diagnosis not present

## 2023-04-16 DIAGNOSIS — H353231 Exudative age-related macular degeneration, bilateral, with active choroidal neovascularization: Secondary | ICD-10-CM | POA: Diagnosis not present

## 2023-04-16 DIAGNOSIS — S72012D Unspecified intracapsular fracture of left femur, subsequent encounter for closed fracture with routine healing: Secondary | ICD-10-CM | POA: Diagnosis not present

## 2023-04-16 DIAGNOSIS — H35033 Hypertensive retinopathy, bilateral: Secondary | ICD-10-CM | POA: Diagnosis not present

## 2023-05-14 DIAGNOSIS — H35373 Puckering of macula, bilateral: Secondary | ICD-10-CM | POA: Diagnosis not present

## 2023-05-14 DIAGNOSIS — H43813 Vitreous degeneration, bilateral: Secondary | ICD-10-CM | POA: Diagnosis not present

## 2023-05-14 DIAGNOSIS — H353231 Exudative age-related macular degeneration, bilateral, with active choroidal neovascularization: Secondary | ICD-10-CM | POA: Diagnosis not present

## 2023-05-14 DIAGNOSIS — H34232 Retinal artery branch occlusion, left eye: Secondary | ICD-10-CM | POA: Diagnosis not present

## 2023-05-14 DIAGNOSIS — H35033 Hypertensive retinopathy, bilateral: Secondary | ICD-10-CM | POA: Diagnosis not present

## 2023-05-17 DIAGNOSIS — K589 Irritable bowel syndrome without diarrhea: Secondary | ICD-10-CM | POA: Diagnosis not present

## 2023-05-17 DIAGNOSIS — M8580 Other specified disorders of bone density and structure, unspecified site: Secondary | ICD-10-CM | POA: Diagnosis not present

## 2023-05-17 DIAGNOSIS — J9601 Acute respiratory failure with hypoxia: Secondary | ICD-10-CM | POA: Diagnosis not present

## 2023-05-17 DIAGNOSIS — S72012D Unspecified intracapsular fracture of left femur, subsequent encounter for closed fracture with routine healing: Secondary | ICD-10-CM | POA: Diagnosis not present

## 2023-06-11 DIAGNOSIS — H353231 Exudative age-related macular degeneration, bilateral, with active choroidal neovascularization: Secondary | ICD-10-CM | POA: Diagnosis not present

## 2023-07-12 DIAGNOSIS — H353231 Exudative age-related macular degeneration, bilateral, with active choroidal neovascularization: Secondary | ICD-10-CM | POA: Diagnosis not present

## 2023-08-31 DIAGNOSIS — H353231 Exudative age-related macular degeneration, bilateral, with active choroidal neovascularization: Secondary | ICD-10-CM | POA: Diagnosis not present

## 2023-09-15 DIAGNOSIS — M62830 Muscle spasm of back: Secondary | ICD-10-CM | POA: Diagnosis not present

## 2023-09-15 DIAGNOSIS — M545 Low back pain, unspecified: Secondary | ICD-10-CM | POA: Diagnosis not present

## 2023-09-15 DIAGNOSIS — R350 Frequency of micturition: Secondary | ICD-10-CM | POA: Diagnosis not present

## 2023-09-21 DIAGNOSIS — M546 Pain in thoracic spine: Secondary | ICD-10-CM | POA: Diagnosis not present

## 2023-09-21 DIAGNOSIS — M47814 Spondylosis without myelopathy or radiculopathy, thoracic region: Secondary | ICD-10-CM | POA: Diagnosis not present

## 2023-09-21 DIAGNOSIS — M419 Scoliosis, unspecified: Secondary | ICD-10-CM | POA: Diagnosis not present

## 2023-09-21 DIAGNOSIS — M4316 Spondylolisthesis, lumbar region: Secondary | ICD-10-CM | POA: Diagnosis not present

## 2023-09-21 DIAGNOSIS — M5126 Other intervertebral disc displacement, lumbar region: Secondary | ICD-10-CM | POA: Diagnosis not present

## 2023-09-21 DIAGNOSIS — M62830 Muscle spasm of back: Secondary | ICD-10-CM | POA: Diagnosis not present

## 2023-09-21 DIAGNOSIS — K5909 Other constipation: Secondary | ICD-10-CM | POA: Diagnosis not present

## 2023-09-21 DIAGNOSIS — M47816 Spondylosis without myelopathy or radiculopathy, lumbar region: Secondary | ICD-10-CM | POA: Diagnosis not present

## 2023-09-21 DIAGNOSIS — M549 Dorsalgia, unspecified: Secondary | ICD-10-CM | POA: Diagnosis not present

## 2023-09-21 DIAGNOSIS — M4135 Thoracogenic scoliosis, thoracolumbar region: Secondary | ICD-10-CM | POA: Diagnosis not present

## 2023-09-21 DIAGNOSIS — M545 Low back pain, unspecified: Secondary | ICD-10-CM | POA: Diagnosis not present

## 2023-10-12 DIAGNOSIS — H35033 Hypertensive retinopathy, bilateral: Secondary | ICD-10-CM | POA: Diagnosis not present

## 2023-10-12 DIAGNOSIS — H35373 Puckering of macula, bilateral: Secondary | ICD-10-CM | POA: Diagnosis not present

## 2023-10-12 DIAGNOSIS — H353231 Exudative age-related macular degeneration, bilateral, with active choroidal neovascularization: Secondary | ICD-10-CM | POA: Diagnosis not present

## 2023-10-12 DIAGNOSIS — H34232 Retinal artery branch occlusion, left eye: Secondary | ICD-10-CM | POA: Diagnosis not present

## 2023-10-12 DIAGNOSIS — H43813 Vitreous degeneration, bilateral: Secondary | ICD-10-CM | POA: Diagnosis not present

## 2023-11-03 ENCOUNTER — Encounter: Payer: Self-pay | Admitting: Sports Medicine

## 2023-11-03 ENCOUNTER — Ambulatory Visit (INDEPENDENT_AMBULATORY_CARE_PROVIDER_SITE_OTHER): Admitting: Sports Medicine

## 2023-11-03 ENCOUNTER — Other Ambulatory Visit (INDEPENDENT_AMBULATORY_CARE_PROVIDER_SITE_OTHER): Payer: Self-pay

## 2023-11-03 DIAGNOSIS — S32010A Wedge compression fracture of first lumbar vertebra, initial encounter for closed fracture: Secondary | ICD-10-CM

## 2023-11-03 DIAGNOSIS — M8000XA Age-related osteoporosis with current pathological fracture, unspecified site, initial encounter for fracture: Secondary | ICD-10-CM

## 2023-11-03 DIAGNOSIS — M545 Low back pain, unspecified: Secondary | ICD-10-CM

## 2023-11-03 DIAGNOSIS — M4150 Other secondary scoliosis, site unspecified: Secondary | ICD-10-CM

## 2023-11-03 DIAGNOSIS — M5136 Other intervertebral disc degeneration, lumbar region with discogenic back pain only: Secondary | ICD-10-CM | POA: Diagnosis not present

## 2023-11-03 DIAGNOSIS — S22080A Wedge compression fracture of T11-T12 vertebra, initial encounter for closed fracture: Secondary | ICD-10-CM | POA: Diagnosis not present

## 2023-11-03 DIAGNOSIS — G8929 Other chronic pain: Secondary | ICD-10-CM

## 2023-11-03 MED ORDER — ALENDRONATE SODIUM 70 MG PO TABS
70.0000 mg | ORAL_TABLET | ORAL | 1 refills | Status: DC
Start: 2023-11-03 — End: 2024-01-14

## 2023-11-03 NOTE — Progress Notes (Addendum)
 Brianna Bush - 89 y.o. female MRN [id number]  Date of birth: Feb 27, 1924  Office Visit Note: Visit Date: 11/03/2023 PCP: Brianna Motto, DO Referred by: Brianna Motto, DO  Subjective: Chief Complaint  Patient presents with   Lower Back - Pain    Patient has complaint of left low back pain that radiates to the left hip. She was on a trip to [place] on Mother's Day weekend and rode in the car there and back. On that Saturday evening, she was lifting platters, etc.  When she woke on Sunday, she was having terrible pain. She is taking hydrocodone  and a muscle relaxer which makes her loopy.  She has had prior xrays at [facility].   HPI: Brianna Bush is a pleasant 89 y.o. female who presents today for acute on chronic low back pain with pain radiating in left hip.  The patient's daughter and granddaughter urged present for the visit today and they will help provide HPI.  Per Brianna Bush and her daughter, she has had chronic low back pain for many years.  However, around Mother's Day in May of this year she started having worsening pain which is newer in the midline and left side of the back.  Over this time her pain has slightly progressed and has radiated into the left side of the low back and posterior hip.  She does not have pain in the groin.  She did have x-rays at an outside facility back in May that showed compression fracture of L1, and to a lesser extent T12.  For pain control, she is managed on 1 tablet of hydrocodone  in the morning and then 1/2 tablet in the evening as needed.  She also uses Tylenol  PRN.  They are here today for evaluation and explanation of her pain as well as next steps.  Her daughter does note that she still has pain but it is slightly improved from where it was a few weeks ago.  She has no numbness or tingling going down the legs.  No changes in acute bladder habits.  She does have a notable history of a left hip femoral neck fracture back in 2024 and is status post  left Hemiarthroplasty with Dr. Vernetta.  Pertinent ROS were reviewed with the patient and found to be negative unless otherwise specified above in HPI.   Assessment & Plan: Visit Diagnoses:  1. Closed compression fracture of body of L1 vertebra (HCC)   2. Compression fracture of T12 vertebra, initial encounter (HCC)   3. Age-related osteoporosis with current pathological fracture, initial encounter   4. Degenerative scoliosis in adult patient   5. Degeneration of intervertebral disc of lumbar region with discogenic back pain   6. Chronic left-sided low back pain without sciatica    Plan: Impression is acute on chronic midline and left-sided low back pain which is multifactorial in nature.  She does have a chronic history of low back pain with advanced degenerative scoliosis and lumbar DDD.  However, I believe her more acute-subacute pain is from her vertebral compression fractures.  Review of x-rays from May in comparison to today shows advancement with further compression of the L1 vertebra.  Back in May, this was about 50% of the normal disc height, today's x-rays show this has further compressed to about 25-30% of the disc height.  T12 compression fracture appears stable and similar to previous x-ray.  I did discuss with Brianna Bush and her family that once this compression fracture settles and stabilizes, in the  weeks to months following her pain will continue to improve.  She is okay to continue her hydrocodone  once to twice daily as needed as well as Tylenol .  Will avoid NSAID use given previous GI bleeding.  She does have osteoporosis with current compression fractures as well as a previous fragility fracture of her left hip, status post Hemi arthroplasty back in 2024.  Discussed that given this, I would like to treat her decreased BMD in order to stabilize the bone density and prevent future fragility fractures.  Will begin Fosamax 70 mg to be taken once weekly (in AM on empty stomach). Patient  and family are agreeable and understanding. Also recommended soft lumbar support brace for analgesic relief and postural stability. I will see her back in 2 months to repeat x-ray to ensure stability and not further advancement of her compression fracture.  I did review x-rays and this treatment plan with our spine physician, Dr. Georgina.  Follow-up: Return in about 2 months (around 01/04/2024) for compression fracture (repeat lumbar XR's).   Meds & Orders:  Meds ordered this encounter  Medications   alendronate (FOSAMAX) 70 MG tablet    Sig: Take 1 tablet (70 mg total) by mouth once a week. Take with a full glass of water on an empty stomach.    Dispense:  12 tablet    Refill:  1    Orders Placed This Encounter  Procedures   XR Lumbar Spine Complete     Procedures: No procedures performed      Clinical History: No specialty comments available.  She reports that she has never smoked. She has never been exposed to tobacco smoke. She has never used smokeless tobacco. No results for input(s): HGBA1C, LABURIC in the last 8760 hours.  Objective:    Physical Exam  Gen: Well-appearing, in no acute distress; non-toxic CV: Well-perfused. Warm.  Resp: Breathing unlabored on room air; no wheezing. Psych: Fluid speech in conversation; appropriate affect; normal thought process  Ortho Exam - Thoracic/Lumbar: There is tenderness to palpation over the midline of the spinous process near the T12-L2 region.  There is significant thoracic kyphosis as well as dextroscoliosis of the thoracolumbar spine.  There is limited extension of the lumbar spine.  Patient does walk without assistance in a kyphotic position.  There is equivocal strength of bilateral lower extremities in the L3-S1 myotome.  - Hips: There is no significant tenderness over the greater trochanteric regions bilaterally.  Logroll without pain or bony restriction.  Negative modified FADIR testing in the sitting  position.  Imaging: XR Lumbar Spine Complete Result Date: 11/03/2023 Complete lumbar spine x-rays including AP, lateral, flexion/extension views were ordered and reviewed by myself today.  X-rays demonstrate advanced degenerative dextroscoliosis of the thoracolumbar spine.  There is evidence of progressive L1 compression fracture.  This is approximately 25-30% of the normal disc height.  When comparison to x-ray films from 09/28/2023, this has progressed in terms of compression (was about 50% at that time).  There is a stable appearing T12 compression fracture without significant loss of disc height.  There is a left hip total arthroplasty noted without hardware abnormality.  Significant lumbar DDD.  Aortic atherosclerosis, advanced.  *Independent review and interpretation of 2 view thoracic spine x-ray from 09/28/2023 was performed by myself today.  There is notable dextroscoliosis of the thoracolumbar spine with exaggerated kyphosis in the mid to lower thoracic spine.  Multilevel degenerative disc disease with anterior osteophytosis.  Incompletely evaluated lower thoracic/upper lumbar compression deformity,  better seen on lumbar XR.  *Independent review and interpretation of 2 view lumbar x-ray from 09/28/2023 was performed by myself today.  There is rather significant degenerative dextroscoliosis.  There is multilevel listhesis at L2 on L3 and L4 on L5.  There is compression deformity at L1 (with about 50% disc height loss) and possibly to a lesser degree at T12.  Multilevel degenerative disc disease and aortic atherosclerosis noted.  - MRI Lumbar spine w/o contrast 04/02/2012 *RADIOLOGY REPORT*   Clinical Data: Low back and left hip and groin pain. Left foot and leg tingling.   MRI LUMBAR SPINE WITHOUT CONTRAST   Technique: Multiplanar and multiecho pulse sequences of the lumbar spine were obtained without intravenous contrast.   Comparison: None   Findings: Tip of the conus is at L1-2 and  appears normal. Normal paraspinal soft tissues. Tiny cysts on the lower pole of the right kidney.   T11-12 and T12-L1: Normal.   L1-2: 2.5 mm retrolisthesis and a small broad-based bulge of the uncovered disc. No neural impingement.   L2-3: Marked disc space narrowing with a 7 mm retrolisthesis of broad-based protrusion of the uncovered disc asymmetric into the right lateral recess. Bilateral foraminal stenosis without neural impingement. Slight compression of the thecal sac without focal neural impingement.   L3-4: 2.5 mm retrolisthesis with minimal bulging of the uncovered disc. No foraminal stenosis or neural impingement. Slight degenerative changes of the right facet joint.   L4-5: 2.5 mm spondylolisthesis. Tiny broad-based bulge of the uncovered disc but no neural impingement. Moderate bilateral facet arthritis. Widely patent neural foramina.   L5-S1: Normal disc. Moderate bilateral facet arthritis.   IMPRESSION: Multilevel degenerative disc and joint disease. No focal neural impingement. No significant spinal or foraminal stenosis.   Moderate bilateral facet arthritis at L4-5 and L5 S1.     Original Report Authenticated By: Lynwood Hugger, M.D.  Past Medical/Family/Surgical/Social History: Medications & Allergies reviewed per EMR, new medications updated. Patient Active Problem List   Diagnosis Date Noted   Aspiration pneumonia (HCC) 12/31/2022   Acute respiratory failure with hypoxia (HCC) 12/31/2022   Closed subcapital fracture of left femur (HCC) 12/27/2022   Acute diastolic (congestive) heart failure (HCC) 12/27/2022   Hip fracture (HCC) 12/26/2022   Closed fracture of neck of left femur (HCC) 12/26/2022   Acute upper GI bleed 04/30/2022   GI bleeding 04/29/2022   Macrocytic anemia 04/29/2022   Colitis 10/11/2015   Hypokalemia 10/11/2015   Stage 3a chronic kidney disease (CKD) (HCC) 10/11/2015   GERD (gastroesophageal reflux disease) 10/11/2015   Leg wound,  left 10/11/2015   Hypertension 10/11/2015   Hyperlipidemia 10/11/2015   Endometrial carcinoma (HCC) 10/11/2015   Colitis, acute 10/11/2015   Past Medical History:  Diagnosis Date   Acute sinusitis, unspecified    Diverticulosis    Endometrial carcinoma (HCC)    GERD (gastroesophageal reflux disease)    Bertrum disease    Hyperlipidemia    Hypertension    IBS (irritable bowel syndrome)    Osteopenia    Vitamin D  deficiency    History reviewed. No pertinent family history. Past Surgical History:  Procedure Laterality Date   ANTERIOR APPROACH HEMI HIP ARTHROPLASTY Left 12/27/2022   Procedure: ANTERIOR APPROACH HEMI HIP ARTHROPLASTY;  Surgeon: Vernetta Lonni GRADE, MD;  Location: [facility];  Service: Orthopedics;  Laterality: Left;   BIOPSY  05/01/2022   Procedure: BIOPSY;  Surgeon: Rosalie Kitchens, MD;  Location: [facility];  Service: Gastroenterology;;   ESOPHAGOGASTRODUODENOSCOPY (EGD) WITH PROPOFOL  N/A  05/01/2022   Procedure: ESOPHAGOGASTRODUODENOSCOPY (EGD) WITH PROPOFOL ;  Surgeon: Rosalie Kitchens, MD;  Location: [facility];  Service: Gastroenterology;  Laterality: N/A;   Social History   Occupational History   Not on file  Tobacco Use   Smoking status: Never    Passive exposure: Never   Smokeless tobacco: Never  Substance and Sexual Activity   Alcohol use: Not on file   Drug use: Not on file   Sexual activity: Not on file   I spent 46 minutes in the care of the patient today including face-to-face time, preparation to see the patient, as well as review of previous documentation and imaging (Canopy review) for both the lumbar spine and thoracic spine x-ray, review in comparison to today's updated x-rays, explanation of radiographic findings and treatment plan both with the patient, her daughter and granddaughter, chart review regarding bone mineral density, previous orthopedic surgery (fragility fracture of left hip), time spent with discussion with my orthopedic spine  physician, Dr. Georgina, as well as medication review and management for the above diagnoses.   Lonell Sprang, DO Primary Care Sports Medicine Physician  [facility]  This note was dictated using Dragon naturally speaking software and may contain errors in syntax, spelling, or content which have not been identified prior to signing this note.

## 2023-11-04 ENCOUNTER — Telehealth: Payer: Self-pay | Admitting: Radiology

## 2023-11-04 NOTE — Telephone Encounter (Signed)
 I left voicemail requesting return call. Patient saw Dr. Burnetta on 11/03/2023 and was to schedule 2 month follow up for repeat xrays and eval. When here, Dr. Burnetta spoke with Dr. Georgina, however, no surgical intervention was needed. If patient is ok with seeing Dr. Burnetta in follow up, we can change appt to his schedule. Unsure if confusion for follow up appt came from discussing Dr. Georgina.

## 2023-11-10 DIAGNOSIS — Z008 Encounter for other general examination: Secondary | ICD-10-CM | POA: Diagnosis not present

## 2023-11-10 DIAGNOSIS — H353290 Exudative age-related macular degeneration, unspecified eye, stage unspecified: Secondary | ICD-10-CM | POA: Diagnosis not present

## 2023-11-10 DIAGNOSIS — Z9181 History of falling: Secondary | ICD-10-CM | POA: Diagnosis not present

## 2023-11-10 DIAGNOSIS — Z823 Family history of stroke: Secondary | ICD-10-CM | POA: Diagnosis not present

## 2023-11-10 DIAGNOSIS — Z8249 Family history of ischemic heart disease and other diseases of the circulatory system: Secondary | ICD-10-CM | POA: Diagnosis not present

## 2023-11-10 DIAGNOSIS — E785 Hyperlipidemia, unspecified: Secondary | ICD-10-CM | POA: Diagnosis not present

## 2023-11-10 DIAGNOSIS — G3184 Mild cognitive impairment, so stated: Secondary | ICD-10-CM | POA: Diagnosis not present

## 2023-11-10 DIAGNOSIS — I129 Hypertensive chronic kidney disease with stage 1 through stage 4 chronic kidney disease, or unspecified chronic kidney disease: Secondary | ICD-10-CM | POA: Diagnosis not present

## 2023-11-10 DIAGNOSIS — M545 Low back pain, unspecified: Secondary | ICD-10-CM | POA: Diagnosis not present

## 2023-11-10 DIAGNOSIS — I700 Atherosclerosis of aorta: Secondary | ICD-10-CM | POA: Diagnosis not present

## 2023-11-10 DIAGNOSIS — H348392 Tributary (branch) retinal vein occlusion, unspecified eye, stable: Secondary | ICD-10-CM | POA: Diagnosis not present

## 2023-11-10 DIAGNOSIS — M1990 Unspecified osteoarthritis, unspecified site: Secondary | ICD-10-CM | POA: Diagnosis not present

## 2023-11-10 DIAGNOSIS — N189 Chronic kidney disease, unspecified: Secondary | ICD-10-CM | POA: Diagnosis not present

## 2023-12-08 DIAGNOSIS — H353231 Exudative age-related macular degeneration, bilateral, with active choroidal neovascularization: Secondary | ICD-10-CM | POA: Diagnosis not present

## 2024-01-05 ENCOUNTER — Ambulatory Visit: Admitting: Sports Medicine

## 2024-01-14 ENCOUNTER — Encounter: Payer: Self-pay | Admitting: Sports Medicine

## 2024-01-14 ENCOUNTER — Other Ambulatory Visit (INDEPENDENT_AMBULATORY_CARE_PROVIDER_SITE_OTHER): Payer: Self-pay

## 2024-01-14 ENCOUNTER — Ambulatory Visit: Admitting: Sports Medicine

## 2024-01-14 DIAGNOSIS — S22080A Wedge compression fracture of T11-T12 vertebra, initial encounter for closed fracture: Secondary | ICD-10-CM | POA: Diagnosis not present

## 2024-01-14 DIAGNOSIS — S22080D Wedge compression fracture of T11-T12 vertebra, subsequent encounter for fracture with routine healing: Secondary | ICD-10-CM | POA: Diagnosis not present

## 2024-01-14 DIAGNOSIS — M8000XD Age-related osteoporosis with current pathological fracture, unspecified site, subsequent encounter for fracture with routine healing: Secondary | ICD-10-CM | POA: Diagnosis not present

## 2024-01-14 DIAGNOSIS — S32010A Wedge compression fracture of first lumbar vertebra, initial encounter for closed fracture: Secondary | ICD-10-CM

## 2024-01-14 DIAGNOSIS — M4150 Other secondary scoliosis, site unspecified: Secondary | ICD-10-CM | POA: Diagnosis not present

## 2024-01-14 DIAGNOSIS — M8000XA Age-related osteoporosis with current pathological fracture, unspecified site, initial encounter for fracture: Secondary | ICD-10-CM

## 2024-01-14 MED ORDER — ALENDRONATE SODIUM 70 MG PO TABS
70.0000 mg | ORAL_TABLET | ORAL | 1 refills | Status: DC
Start: 2024-01-14 — End: 2024-01-14

## 2024-01-14 MED ORDER — ALENDRONATE SODIUM 70 MG PO TABS
70.0000 mg | ORAL_TABLET | ORAL | 2 refills | Status: AC
Start: 2024-01-14 — End: ?

## 2024-01-14 NOTE — Progress Notes (Signed)
 Patient says that she is feeling better than she was at her last visit. Her back pain is not as bothersome as it was, both in intensity and consistency. She denies any new pain or symptoms in the time since her last visit. She is here for follow up and repeat x-rays.

## 2024-01-14 NOTE — Progress Notes (Signed)
 Brianna Bush - 89 y.o. female MRN [id number]  Date of birth: Mar 31, 1924  Office Visit Note: Visit Date: 01/14/2024 PCP: Brianna Motto, DO Referred by: Brianna Motto, DO  Subjective: Chief Complaint  Patient presents with   Lower Back - Pain, Follow-up   HPI: Brianna Bush is a pleasant 89 y.o. female who presents today for follow-up of T12 and L-1lumbar compression fracture in the setting of degenerative scoliosis.  She presents with her granddaughter today, who does help provide some of HPI.  Brianna Bush states that she is doing well.  She states she has no significant back pain, only has pain when sitting for prolonged peers of time, but is able to get up and move and her back pain improves.  Her granddaughter does state that she has been able to walk and be much more active like she normally is.  Both Brianna Bush and her granddaughter state that she is taking her Fosamax  medication once daily in AM.  She is no longer requiring nor taking pain medication, does have Tylenol  to use as needed.  Pertinent ROS were reviewed with the patient and found to be negative unless otherwise specified above in HPI.   Assessment & Plan: Visit Diagnoses:  1. Closed compression fracture of body of L1 vertebra (HCC)   2. Compression fracture of T12 vertebra with routine healing, subsequent encounter   3. Age-related osteoporosis with current pathological fracture with routine healing, subsequent encounter   4. Degenerative scoliosis in adult patient   5. Compression fracture of T12 vertebra, initial encounter (HCC)   6. Age-related osteoporosis with current pathological fracture, initial encounter    Plan: Impression is a stabilized and settled compression fracture of L1 with mild compression deformity of T12 which have healed clinically and radiographically.  This is in the setting of age-related osteoporosis.  She is no longer having any pain from her compression fractures and x-rays show the  compression deformity has settled.  We did discuss continuing Fosamax  70 mg once weekly for the perceivable future to help stabilize her bone density and help prevent future compression fractures/fragility deformities.  I did refill this medication for her.  Discussed overall physiology of bisphosphonates, signs to look for, and general treatment being about 3-4 years before a holiday off and considering rechecking DEXA.  Given that her pain is improved, she may discontinue her hydrocodone -acetaminophen  5-325 mg but may use Tylenol  as needed.  Recommended she continue remaining active with safe physical activity as able.  She will follow-up with me as needed in the short-term, recommend yearly follow-up for the back and medication check.  Follow-up: Return if symptoms worsen or fail to improve.   Meds & Orders:  Meds ordered this encounter  Medications   DISCONTD: alendronate  (FOSAMAX ) 70 MG tablet    Sig: Take 1 tablet (70 mg total) by mouth once a week. Take with a full glass of water on an empty stomach.    Dispense:  12 tablet    Refill:  1   alendronate  (FOSAMAX ) 70 MG tablet    Sig: Take 1 tablet (70 mg total) by mouth once a week. Take with a full glass of water on an empty stomach.    Dispense:  12 tablet    Refill:  2    Orders Placed This Encounter  Procedures   XR Lumbar Spine 2-3 Views     Procedures: No procedures performed      Clinical History: No specialty comments available.  She reports  that she has never smoked. She has never been exposed to tobacco smoke. She has never used smokeless tobacco. No results for input(s): HGBA1C, LABURIC in the last 8760 hours.  Objective:    Physical Exam  Gen: Well-appearing, in no acute distress; non-toxic CV: Well-perfused. Warm.  Resp: Breathing unlabored on room air; no wheezing. Psych: Fluid speech in conversation; appropriate affect; normal thought process  Ortho Exam - Lumbar spine: There is a mild kyphotic posture  with notable thoracolumbar scoliosis.  There is no tenderness to palpation on the midline of the lumbar spine.  Patient walks unassisted.  Imaging: XR Lumbar Spine 2-3 Views Result Date: 01/14/2024 2 views of the lumbar spine including AP and lateral film were ordered and reviewed by myself today.  X-rays redemonstrate advanced degenerative dextroscoliosis of the thoracolumbar spine.  There is a redemonstrated L1 compression fracture which is approximately the same disc height compared to last x-rays back in July, maybe has lost about 5% of the disc height but there is sclerosis indicative of compression fracture saddling.  The disc height is about 20-25% of the L2 disc height below.  X-rays demonstrate a stable T12 compression fracture without significant loss of disc height and no change from last x-rays in July. Aortic atherosclerosis noted, advanced.    Past Medical/Family/Surgical/Social History: Medications & Allergies reviewed per EMR, new medications updated. Patient Active Problem List   Diagnosis Date Noted   Aspiration pneumonia (HCC) 12/31/2022   Acute respiratory failure with hypoxia (HCC) 12/31/2022   Closed subcapital fracture of left femur (HCC) 12/27/2022   Acute diastolic (congestive) heart failure (HCC) 12/27/2022   Hip fracture (HCC) 12/26/2022   Closed fracture of neck of left femur (HCC) 12/26/2022   Acute upper GI bleed 04/30/2022   GI bleeding 04/29/2022   Macrocytic anemia 04/29/2022   Colitis 10/11/2015   Hypokalemia 10/11/2015   Stage 3a chronic kidney disease (CKD) (HCC) 10/11/2015   GERD (gastroesophageal reflux disease) 10/11/2015   Leg wound, left 10/11/2015   Hypertension 10/11/2015   Hyperlipidemia 10/11/2015   Endometrial carcinoma (HCC) 10/11/2015   Colitis, acute 10/11/2015   Past Medical History:  Diagnosis Date   Acute sinusitis, unspecified    Diverticulosis    Endometrial carcinoma (HCC)    GERD (gastroesophageal reflux disease)    Bertrum  disease    Hyperlipidemia    Hypertension    IBS (irritable bowel syndrome)    Osteopenia    Vitamin D  deficiency    History reviewed. No pertinent family history. Past Surgical History:  Procedure Laterality Date   ANTERIOR APPROACH HEMI HIP ARTHROPLASTY Left 12/27/2022   Procedure: ANTERIOR APPROACH HEMI HIP ARTHROPLASTY;  Surgeon: Vernetta Lonni GRADE, MD;  Location: [facility];  Service: Orthopedics;  Laterality: Left;   BIOPSY  05/01/2022   Procedure: BIOPSY;  Surgeon: Rosalie Kitchens, MD;  Location: [facility];  Service: Gastroenterology;;   ESOPHAGOGASTRODUODENOSCOPY (EGD) WITH PROPOFOL  N/A 05/01/2022   Procedure: ESOPHAGOGASTRODUODENOSCOPY (EGD) WITH PROPOFOL ;  Surgeon: Rosalie Kitchens, MD;  Location: [facility];  Service: Gastroenterology;  Laterality: N/A;   Social History   Occupational History   Not on file  Tobacco Use   Smoking status: Never    Passive exposure: Never   Smokeless tobacco: Never  Substance and Sexual Activity   Alcohol use: Not on file   Drug use: Not on file   Sexual activity: Not on file

## 2024-01-31 DIAGNOSIS — H353231 Exudative age-related macular degeneration, bilateral, with active choroidal neovascularization: Secondary | ICD-10-CM | POA: Diagnosis not present

## 2024-03-06 ENCOUNTER — Encounter: Payer: Self-pay | Admitting: Radiology

## 2024-03-06 DIAGNOSIS — H353231 Exudative age-related macular degeneration, bilateral, with active choroidal neovascularization: Secondary | ICD-10-CM | POA: Diagnosis not present

## 2024-03-06 DIAGNOSIS — H35033 Hypertensive retinopathy, bilateral: Secondary | ICD-10-CM | POA: Diagnosis not present

## 2024-03-06 DIAGNOSIS — H43813 Vitreous degeneration, bilateral: Secondary | ICD-10-CM | POA: Diagnosis not present

## 2024-03-06 DIAGNOSIS — H35373 Puckering of macula, bilateral: Secondary | ICD-10-CM | POA: Diagnosis not present

## 2024-03-06 DIAGNOSIS — H34232 Retinal artery branch occlusion, left eye: Secondary | ICD-10-CM | POA: Diagnosis not present

## 2024-04-17 DIAGNOSIS — H353231 Exudative age-related macular degeneration, bilateral, with active choroidal neovascularization: Secondary | ICD-10-CM | POA: Diagnosis not present
